# Patient Record
Sex: Female | Born: 1980 | Race: Black or African American | Hispanic: No | Smoking: Current every day smoker
Health system: Southern US, Community
[De-identification: ages and names within clinical notes are randomized; demographics above are authoritative.]

## PROBLEM LIST (undated history)

## (undated) ENCOUNTER — Ambulatory Visit: Source: Home / Self Care

## (undated) ENCOUNTER — Inpatient Hospital Stay (HOSPITAL_COMMUNITY): Payer: Self-pay

## (undated) DIAGNOSIS — J40 Bronchitis, not specified as acute or chronic: Secondary | ICD-10-CM

## (undated) DIAGNOSIS — J189 Pneumonia, unspecified organism: Secondary | ICD-10-CM

## (undated) DIAGNOSIS — E669 Obesity, unspecified: Secondary | ICD-10-CM

## (undated) DIAGNOSIS — E282 Polycystic ovarian syndrome: Secondary | ICD-10-CM

## (undated) DIAGNOSIS — E119 Type 2 diabetes mellitus without complications: Secondary | ICD-10-CM

## (undated) DIAGNOSIS — I10 Essential (primary) hypertension: Secondary | ICD-10-CM

## (undated) HISTORY — PX: CARPAL TUNNEL RELEASE: SHX101

## (undated) HISTORY — PX: BLADDER REPAIR: SHX76

## (undated) HISTORY — PX: CHOLECYSTECTOMY: SHX55

---
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
## 1999-01-17 ENCOUNTER — Other Ambulatory Visit: Admission: RE | Admit: 1999-01-17 | Discharge: 1999-01-17 | Payer: Self-pay | Admitting: Family Medicine

## 1999-01-17 ENCOUNTER — Encounter: Admission: RE | Admit: 1999-01-17 | Discharge: 1999-01-17 | Payer: Self-pay | Admitting: Sports Medicine

## 1999-01-24 ENCOUNTER — Encounter: Admission: RE | Admit: 1999-01-24 | Discharge: 1999-01-24 | Payer: Self-pay | Admitting: Sports Medicine

## 1999-02-06 ENCOUNTER — Inpatient Hospital Stay (HOSPITAL_COMMUNITY): Admission: AD | Admit: 1999-02-06 | Discharge: 1999-02-06 | Payer: Self-pay | Admitting: Obstetrics

## 1999-02-10 ENCOUNTER — Ambulatory Visit (HOSPITAL_COMMUNITY): Admission: RE | Admit: 1999-02-10 | Discharge: 1999-02-10 | Payer: Self-pay | Admitting: Family Medicine

## 1999-02-10 ENCOUNTER — Encounter: Payer: Self-pay | Admitting: Family Medicine

## 1999-02-16 ENCOUNTER — Encounter: Admission: RE | Admit: 1999-02-16 | Discharge: 1999-02-16 | Payer: Self-pay | Admitting: Family Medicine

## 1999-03-15 ENCOUNTER — Encounter: Payer: Self-pay | Admitting: Family Medicine

## 1999-03-15 ENCOUNTER — Ambulatory Visit (HOSPITAL_COMMUNITY): Admission: RE | Admit: 1999-03-15 | Discharge: 1999-03-15 | Payer: Self-pay

## 1999-03-20 ENCOUNTER — Encounter: Admission: RE | Admit: 1999-03-20 | Discharge: 1999-03-20 | Payer: Self-pay | Admitting: Family Medicine

## 1999-04-26 ENCOUNTER — Encounter: Admission: RE | Admit: 1999-04-26 | Discharge: 1999-04-26 | Payer: Self-pay | Admitting: Family Medicine

## 1999-05-01 ENCOUNTER — Encounter: Admission: RE | Admit: 1999-05-01 | Discharge: 1999-05-01 | Payer: Self-pay | Admitting: Family Medicine

## 1999-05-26 ENCOUNTER — Encounter: Admission: RE | Admit: 1999-05-26 | Discharge: 1999-05-26 | Payer: Self-pay | Admitting: Family Medicine

## 1999-05-29 ENCOUNTER — Encounter: Admission: RE | Admit: 1999-05-29 | Discharge: 1999-05-29 | Payer: Self-pay | Admitting: Family Medicine

## 1999-06-05 ENCOUNTER — Ambulatory Visit (HOSPITAL_COMMUNITY): Admission: RE | Admit: 1999-06-05 | Discharge: 1999-06-05 | Payer: Self-pay | Admitting: Obstetrics

## 1999-06-05 ENCOUNTER — Inpatient Hospital Stay (HOSPITAL_COMMUNITY): Admission: AD | Admit: 1999-06-05 | Discharge: 1999-06-05 | Payer: Self-pay | Admitting: Family Medicine

## 1999-06-08 ENCOUNTER — Encounter (HOSPITAL_COMMUNITY): Admission: RE | Admit: 1999-06-08 | Discharge: 1999-08-10 | Payer: Self-pay | Admitting: Obstetrics & Gynecology

## 1999-06-12 ENCOUNTER — Encounter: Payer: Self-pay | Admitting: *Deleted

## 1999-06-29 ENCOUNTER — Inpatient Hospital Stay (HOSPITAL_COMMUNITY): Admission: AD | Admit: 1999-06-29 | Discharge: 1999-06-29 | Payer: Self-pay | Admitting: Obstetrics & Gynecology

## 1999-06-29 ENCOUNTER — Encounter: Admission: RE | Admit: 1999-06-29 | Discharge: 1999-06-29 | Payer: Self-pay | Admitting: Family Medicine

## 1999-06-30 ENCOUNTER — Observation Stay (HOSPITAL_COMMUNITY): Admission: AD | Admit: 1999-06-30 | Discharge: 1999-07-01 | Payer: Self-pay | Admitting: Obstetrics & Gynecology

## 1999-07-07 ENCOUNTER — Encounter: Admission: RE | Admit: 1999-07-07 | Discharge: 1999-07-07 | Payer: Self-pay | Admitting: Family Medicine

## 1999-07-21 ENCOUNTER — Encounter: Admission: RE | Admit: 1999-07-21 | Discharge: 1999-07-21 | Payer: Self-pay | Admitting: Family Medicine

## 1999-07-26 ENCOUNTER — Inpatient Hospital Stay (HOSPITAL_COMMUNITY): Admission: AD | Admit: 1999-07-26 | Discharge: 1999-07-26 | Payer: Self-pay | Admitting: *Deleted

## 1999-07-28 ENCOUNTER — Encounter: Admission: RE | Admit: 1999-07-28 | Discharge: 1999-07-28 | Payer: Self-pay | Admitting: Family Medicine

## 1999-08-09 ENCOUNTER — Inpatient Hospital Stay (HOSPITAL_COMMUNITY): Admission: AD | Admit: 1999-08-09 | Discharge: 1999-08-10 | Payer: Self-pay | Admitting: Obstetrics

## 1999-11-07 ENCOUNTER — Encounter: Admission: RE | Admit: 1999-11-07 | Discharge: 1999-12-19 | Payer: Self-pay | Admitting: Family Medicine

## 2000-01-24 ENCOUNTER — Emergency Department (HOSPITAL_COMMUNITY): Admission: EM | Admit: 2000-01-24 | Discharge: 2000-01-24 | Payer: Self-pay | Admitting: Emergency Medicine

## 2000-05-19 ENCOUNTER — Encounter: Payer: Self-pay | Admitting: Emergency Medicine

## 2000-05-19 ENCOUNTER — Emergency Department (HOSPITAL_COMMUNITY): Admission: EM | Admit: 2000-05-19 | Discharge: 2000-05-19 | Payer: Self-pay | Admitting: Emergency Medicine

## 2001-01-01 ENCOUNTER — Encounter: Admission: RE | Admit: 2001-01-01 | Discharge: 2001-01-01 | Payer: Self-pay | Admitting: Family Medicine

## 2001-03-12 ENCOUNTER — Emergency Department (HOSPITAL_COMMUNITY): Admission: EM | Admit: 2001-03-12 | Discharge: 2001-03-12 | Payer: Self-pay

## 2001-07-23 ENCOUNTER — Other Ambulatory Visit: Admission: RE | Admit: 2001-07-23 | Discharge: 2001-07-23 | Payer: Self-pay | Admitting: Family Medicine

## 2001-07-23 ENCOUNTER — Encounter: Admission: RE | Admit: 2001-07-23 | Discharge: 2001-07-23 | Payer: Self-pay | Admitting: Family Medicine

## 2002-04-08 ENCOUNTER — Encounter: Admission: RE | Admit: 2002-04-08 | Discharge: 2002-04-08 | Payer: Self-pay | Admitting: Family Medicine

## 2002-05-02 ENCOUNTER — Emergency Department (HOSPITAL_COMMUNITY): Admission: EM | Admit: 2002-05-02 | Discharge: 2002-05-02 | Payer: Self-pay | Admitting: Emergency Medicine

## 2002-05-06 ENCOUNTER — Encounter: Admission: RE | Admit: 2002-05-06 | Discharge: 2002-05-06 | Payer: Self-pay | Admitting: Family Medicine

## 2002-11-25 ENCOUNTER — Emergency Department (HOSPITAL_COMMUNITY): Admission: EM | Admit: 2002-11-25 | Discharge: 2002-11-25 | Payer: Self-pay | Admitting: Emergency Medicine

## 2002-11-25 ENCOUNTER — Encounter: Payer: Self-pay | Admitting: Emergency Medicine

## 2003-01-19 ENCOUNTER — Ambulatory Visit (HOSPITAL_BASED_OUTPATIENT_CLINIC_OR_DEPARTMENT_OTHER): Admission: RE | Admit: 2003-01-19 | Discharge: 2003-01-19 | Payer: Self-pay | Admitting: Dentistry

## 2003-04-29 ENCOUNTER — Emergency Department (HOSPITAL_COMMUNITY): Admission: EM | Admit: 2003-04-29 | Discharge: 2003-04-29 | Payer: Self-pay | Admitting: Emergency Medicine

## 2003-07-28 ENCOUNTER — Encounter: Admission: RE | Admit: 2003-07-28 | Discharge: 2003-07-28 | Payer: Self-pay | Admitting: Family Medicine

## 2003-11-03 ENCOUNTER — Encounter: Admission: RE | Admit: 2003-11-03 | Discharge: 2003-11-03 | Payer: Self-pay | Admitting: Sports Medicine

## 2003-11-25 ENCOUNTER — Emergency Department (HOSPITAL_COMMUNITY): Admission: EM | Admit: 2003-11-25 | Discharge: 2003-11-25 | Payer: Self-pay | Admitting: Emergency Medicine

## 2004-03-18 ENCOUNTER — Emergency Department (HOSPITAL_COMMUNITY): Admission: EM | Admit: 2004-03-18 | Discharge: 2004-03-18 | Payer: Self-pay | Admitting: Emergency Medicine

## 2004-03-22 ENCOUNTER — Emergency Department (HOSPITAL_COMMUNITY): Admission: EM | Admit: 2004-03-22 | Discharge: 2004-03-22 | Payer: Self-pay | Admitting: Emergency Medicine

## 2004-07-10 ENCOUNTER — Emergency Department (HOSPITAL_COMMUNITY): Admission: EM | Admit: 2004-07-10 | Discharge: 2004-07-10 | Payer: Self-pay | Admitting: Family Medicine

## 2004-07-28 ENCOUNTER — Emergency Department (HOSPITAL_COMMUNITY): Admission: EM | Admit: 2004-07-28 | Discharge: 2004-07-28 | Payer: Self-pay | Admitting: Emergency Medicine

## 2004-08-12 ENCOUNTER — Emergency Department (HOSPITAL_COMMUNITY): Admission: EM | Admit: 2004-08-12 | Discharge: 2004-08-12 | Payer: Self-pay | Admitting: Family Medicine

## 2004-09-09 ENCOUNTER — Emergency Department (HOSPITAL_COMMUNITY): Admission: EM | Admit: 2004-09-09 | Discharge: 2004-09-10 | Payer: Self-pay | Admitting: Emergency Medicine

## 2004-09-27 ENCOUNTER — Emergency Department (HOSPITAL_COMMUNITY): Admission: EM | Admit: 2004-09-27 | Discharge: 2004-09-27 | Payer: Self-pay | Admitting: Emergency Medicine

## 2004-10-31 ENCOUNTER — Emergency Department (HOSPITAL_COMMUNITY): Admission: EM | Admit: 2004-10-31 | Discharge: 2004-10-31 | Payer: Self-pay | Admitting: Emergency Medicine

## 2004-12-13 ENCOUNTER — Emergency Department (HOSPITAL_COMMUNITY): Admission: EM | Admit: 2004-12-13 | Discharge: 2004-12-13 | Payer: Self-pay | Admitting: Family Medicine

## 2005-02-10 ENCOUNTER — Emergency Department (HOSPITAL_COMMUNITY): Admission: EM | Admit: 2005-02-10 | Discharge: 2005-02-10 | Payer: Self-pay | Admitting: Emergency Medicine

## 2005-02-26 ENCOUNTER — Emergency Department (HOSPITAL_COMMUNITY): Admission: EM | Admit: 2005-02-26 | Discharge: 2005-02-26 | Payer: Self-pay | Admitting: Family Medicine

## 2005-03-05 ENCOUNTER — Emergency Department (HOSPITAL_COMMUNITY): Admission: EM | Admit: 2005-03-05 | Discharge: 2005-03-05 | Payer: Self-pay | Admitting: Family Medicine

## 2005-03-19 ENCOUNTER — Emergency Department (HOSPITAL_COMMUNITY): Admission: EM | Admit: 2005-03-19 | Discharge: 2005-03-19 | Payer: Self-pay | Admitting: Family Medicine

## 2005-03-26 ENCOUNTER — Emergency Department (HOSPITAL_COMMUNITY): Admission: EM | Admit: 2005-03-26 | Discharge: 2005-03-26 | Payer: Self-pay | Admitting: Emergency Medicine

## 2005-04-26 ENCOUNTER — Emergency Department (HOSPITAL_COMMUNITY): Admission: EM | Admit: 2005-04-26 | Discharge: 2005-04-26 | Payer: Self-pay | Admitting: Emergency Medicine

## 2005-05-05 ENCOUNTER — Emergency Department (HOSPITAL_COMMUNITY): Admission: EM | Admit: 2005-05-05 | Discharge: 2005-05-05 | Payer: Self-pay | Admitting: Family Medicine

## 2005-05-28 ENCOUNTER — Emergency Department (HOSPITAL_COMMUNITY): Admission: EM | Admit: 2005-05-28 | Discharge: 2005-05-29 | Payer: Self-pay | Admitting: Emergency Medicine

## 2005-06-17 ENCOUNTER — Emergency Department (HOSPITAL_COMMUNITY): Admission: EM | Admit: 2005-06-17 | Discharge: 2005-06-17 | Payer: Self-pay | Admitting: Emergency Medicine

## 2005-06-23 ENCOUNTER — Emergency Department (HOSPITAL_COMMUNITY): Admission: EM | Admit: 2005-06-23 | Discharge: 2005-06-23 | Payer: Self-pay | Admitting: Emergency Medicine

## 2005-06-25 ENCOUNTER — Emergency Department (HOSPITAL_COMMUNITY): Admission: EM | Admit: 2005-06-25 | Discharge: 2005-06-25 | Payer: Self-pay | Admitting: Emergency Medicine

## 2005-06-26 ENCOUNTER — Ambulatory Visit: Payer: Self-pay | Admitting: Sports Medicine

## 2005-09-08 ENCOUNTER — Emergency Department (HOSPITAL_COMMUNITY): Admission: EM | Admit: 2005-09-08 | Discharge: 2005-09-08 | Payer: Self-pay | Admitting: Family Medicine

## 2005-10-09 ENCOUNTER — Emergency Department (HOSPITAL_COMMUNITY): Admission: EM | Admit: 2005-10-09 | Discharge: 2005-10-10 | Payer: Self-pay | Admitting: Emergency Medicine

## 2005-10-23 ENCOUNTER — Emergency Department (HOSPITAL_COMMUNITY): Admission: EM | Admit: 2005-10-23 | Discharge: 2005-10-23 | Payer: Self-pay | Admitting: *Deleted

## 2005-11-23 ENCOUNTER — Emergency Department (HOSPITAL_COMMUNITY): Admission: EM | Admit: 2005-11-23 | Discharge: 2005-11-23 | Payer: Self-pay | Admitting: Emergency Medicine

## 2005-12-22 ENCOUNTER — Emergency Department (HOSPITAL_COMMUNITY): Admission: EM | Admit: 2005-12-22 | Discharge: 2005-12-22 | Payer: Self-pay | Admitting: Emergency Medicine

## 2006-01-15 ENCOUNTER — Emergency Department (HOSPITAL_COMMUNITY): Admission: EM | Admit: 2006-01-15 | Discharge: 2006-01-15 | Payer: Self-pay | Admitting: Emergency Medicine

## 2006-01-25 ENCOUNTER — Emergency Department (HOSPITAL_COMMUNITY): Admission: EM | Admit: 2006-01-25 | Discharge: 2006-01-25 | Payer: Self-pay | Admitting: Emergency Medicine

## 2006-03-05 ENCOUNTER — Emergency Department (HOSPITAL_COMMUNITY): Admission: EM | Admit: 2006-03-05 | Discharge: 2006-03-05 | Payer: Self-pay | Admitting: Emergency Medicine

## 2006-03-12 ENCOUNTER — Emergency Department (HOSPITAL_COMMUNITY): Admission: EM | Admit: 2006-03-12 | Discharge: 2006-03-12 | Payer: Self-pay | Admitting: Emergency Medicine

## 2006-03-14 ENCOUNTER — Inpatient Hospital Stay (HOSPITAL_COMMUNITY): Admission: EM | Admit: 2006-03-14 | Discharge: 2006-03-16 | Payer: Self-pay | Admitting: Emergency Medicine

## 2006-06-03 ENCOUNTER — Emergency Department (HOSPITAL_COMMUNITY): Admission: EM | Admit: 2006-06-03 | Discharge: 2006-06-03 | Payer: Self-pay | Admitting: Emergency Medicine

## 2006-08-27 ENCOUNTER — Emergency Department (HOSPITAL_COMMUNITY): Admission: EM | Admit: 2006-08-27 | Discharge: 2006-08-27 | Payer: Self-pay | Admitting: Emergency Medicine

## 2006-08-30 ENCOUNTER — Emergency Department (HOSPITAL_COMMUNITY): Admission: EM | Admit: 2006-08-30 | Discharge: 2006-08-30 | Payer: Self-pay | Admitting: Emergency Medicine

## 2006-10-15 ENCOUNTER — Emergency Department (HOSPITAL_COMMUNITY): Admission: EM | Admit: 2006-10-15 | Discharge: 2006-10-15 | Payer: Self-pay | Admitting: Emergency Medicine

## 2006-10-18 ENCOUNTER — Emergency Department (HOSPITAL_COMMUNITY): Admission: EM | Admit: 2006-10-18 | Discharge: 2006-10-18 | Payer: Self-pay | Admitting: Emergency Medicine

## 2007-01-07 ENCOUNTER — Emergency Department (HOSPITAL_COMMUNITY): Admission: EM | Admit: 2007-01-07 | Discharge: 2007-01-07 | Payer: Self-pay | Admitting: Emergency Medicine

## 2007-06-28 ENCOUNTER — Emergency Department (HOSPITAL_COMMUNITY): Admission: EM | Admit: 2007-06-28 | Discharge: 2007-06-28 | Payer: Self-pay | Admitting: Emergency Medicine

## 2007-06-29 ENCOUNTER — Emergency Department (HOSPITAL_COMMUNITY): Admission: EM | Admit: 2007-06-29 | Discharge: 2007-06-30 | Payer: Self-pay | Admitting: Emergency Medicine

## 2007-07-22 ENCOUNTER — Emergency Department (HOSPITAL_COMMUNITY): Admission: EM | Admit: 2007-07-22 | Discharge: 2007-07-22 | Payer: Self-pay | Admitting: Family Medicine

## 2007-08-19 ENCOUNTER — Emergency Department (HOSPITAL_COMMUNITY): Admission: EM | Admit: 2007-08-19 | Discharge: 2007-08-19 | Payer: Self-pay | Admitting: Emergency Medicine

## 2007-09-03 ENCOUNTER — Emergency Department: Payer: Self-pay | Admitting: Emergency Medicine

## 2007-10-04 ENCOUNTER — Emergency Department (HOSPITAL_COMMUNITY): Admission: EM | Admit: 2007-10-04 | Discharge: 2007-10-05 | Payer: Self-pay | Admitting: Emergency Medicine

## 2007-10-18 ENCOUNTER — Emergency Department (HOSPITAL_COMMUNITY): Admission: EM | Admit: 2007-10-18 | Discharge: 2007-10-18 | Payer: Self-pay | Admitting: Emergency Medicine

## 2007-10-28 ENCOUNTER — Emergency Department (HOSPITAL_COMMUNITY): Admission: EM | Admit: 2007-10-28 | Discharge: 2007-10-28 | Payer: Self-pay | Admitting: Emergency Medicine

## 2007-10-30 ENCOUNTER — Emergency Department (HOSPITAL_COMMUNITY): Admission: EM | Admit: 2007-10-30 | Discharge: 2007-10-30 | Payer: Self-pay | Admitting: Nurse Practitioner

## 2008-02-11 ENCOUNTER — Emergency Department (HOSPITAL_BASED_OUTPATIENT_CLINIC_OR_DEPARTMENT_OTHER): Admission: EM | Admit: 2008-02-11 | Discharge: 2008-02-11 | Payer: Self-pay | Admitting: Emergency Medicine

## 2008-05-08 ENCOUNTER — Emergency Department (HOSPITAL_BASED_OUTPATIENT_CLINIC_OR_DEPARTMENT_OTHER): Admission: EM | Admit: 2008-05-08 | Discharge: 2008-05-09 | Payer: Self-pay | Admitting: Emergency Medicine

## 2008-05-08 ENCOUNTER — Ambulatory Visit: Payer: Self-pay | Admitting: Diagnostic Radiology

## 2008-05-22 ENCOUNTER — Emergency Department (HOSPITAL_BASED_OUTPATIENT_CLINIC_OR_DEPARTMENT_OTHER): Admission: EM | Admit: 2008-05-22 | Discharge: 2008-05-22 | Payer: Self-pay | Admitting: Emergency Medicine

## 2008-05-23 ENCOUNTER — Emergency Department (HOSPITAL_COMMUNITY): Admission: EM | Admit: 2008-05-23 | Discharge: 2008-05-23 | Payer: Self-pay | Admitting: Emergency Medicine

## 2008-08-28 ENCOUNTER — Emergency Department (HOSPITAL_COMMUNITY): Admission: EM | Admit: 2008-08-28 | Discharge: 2008-08-28 | Payer: Self-pay | Admitting: Emergency Medicine

## 2008-08-30 ENCOUNTER — Emergency Department (HOSPITAL_BASED_OUTPATIENT_CLINIC_OR_DEPARTMENT_OTHER): Admission: EM | Admit: 2008-08-30 | Discharge: 2008-08-30 | Payer: Self-pay | Admitting: Emergency Medicine

## 2008-11-26 ENCOUNTER — Ambulatory Visit (HOSPITAL_COMMUNITY): Admission: RE | Admit: 2008-11-26 | Discharge: 2008-11-26 | Payer: Self-pay | Admitting: Obstetrics

## 2008-11-30 ENCOUNTER — Ambulatory Visit: Payer: Self-pay | Admitting: Radiology

## 2008-11-30 ENCOUNTER — Emergency Department (HOSPITAL_BASED_OUTPATIENT_CLINIC_OR_DEPARTMENT_OTHER): Admission: EM | Admit: 2008-11-30 | Discharge: 2008-11-30 | Payer: Self-pay | Admitting: Emergency Medicine

## 2008-12-04 ENCOUNTER — Emergency Department (HOSPITAL_BASED_OUTPATIENT_CLINIC_OR_DEPARTMENT_OTHER): Admission: EM | Admit: 2008-12-04 | Discharge: 2008-12-04 | Payer: Self-pay | Admitting: Emergency Medicine

## 2009-01-28 ENCOUNTER — Emergency Department (HOSPITAL_COMMUNITY): Admission: EM | Admit: 2009-01-28 | Discharge: 2009-01-28 | Payer: Self-pay | Admitting: Emergency Medicine

## 2009-02-15 ENCOUNTER — Emergency Department (HOSPITAL_COMMUNITY): Admission: EM | Admit: 2009-02-15 | Discharge: 2009-02-16 | Payer: Self-pay | Admitting: Emergency Medicine

## 2009-03-23 ENCOUNTER — Ambulatory Visit: Payer: Self-pay | Admitting: Diagnostic Radiology

## 2009-03-23 ENCOUNTER — Emergency Department (HOSPITAL_BASED_OUTPATIENT_CLINIC_OR_DEPARTMENT_OTHER): Admission: EM | Admit: 2009-03-23 | Discharge: 2009-03-23 | Payer: Self-pay | Admitting: Emergency Medicine

## 2009-05-30 ENCOUNTER — Encounter: Admission: RE | Admit: 2009-05-30 | Discharge: 2009-08-28 | Payer: Self-pay | Admitting: Internal Medicine

## 2009-06-18 ENCOUNTER — Emergency Department (HOSPITAL_BASED_OUTPATIENT_CLINIC_OR_DEPARTMENT_OTHER): Admission: EM | Admit: 2009-06-18 | Discharge: 2009-06-18 | Payer: Self-pay | Admitting: Emergency Medicine

## 2009-06-18 ENCOUNTER — Ambulatory Visit: Payer: Self-pay | Admitting: Diagnostic Radiology

## 2009-07-31 ENCOUNTER — Emergency Department (HOSPITAL_BASED_OUTPATIENT_CLINIC_OR_DEPARTMENT_OTHER): Admission: EM | Admit: 2009-07-31 | Discharge: 2009-07-31 | Payer: Self-pay | Admitting: Emergency Medicine

## 2009-08-30 ENCOUNTER — Emergency Department (HOSPITAL_BASED_OUTPATIENT_CLINIC_OR_DEPARTMENT_OTHER): Admission: EM | Admit: 2009-08-30 | Discharge: 2009-08-30 | Payer: Self-pay | Admitting: Emergency Medicine

## 2009-09-23 ENCOUNTER — Emergency Department (HOSPITAL_BASED_OUTPATIENT_CLINIC_OR_DEPARTMENT_OTHER): Admission: EM | Admit: 2009-09-23 | Discharge: 2009-09-23 | Payer: Self-pay | Admitting: Emergency Medicine

## 2009-10-12 ENCOUNTER — Emergency Department (HOSPITAL_BASED_OUTPATIENT_CLINIC_OR_DEPARTMENT_OTHER): Admission: EM | Admit: 2009-10-12 | Discharge: 2009-10-13 | Payer: Self-pay | Admitting: Emergency Medicine

## 2010-03-04 ENCOUNTER — Emergency Department (HOSPITAL_BASED_OUTPATIENT_CLINIC_OR_DEPARTMENT_OTHER): Admission: EM | Admit: 2010-03-04 | Discharge: 2010-03-04 | Payer: Self-pay | Admitting: Emergency Medicine

## 2010-03-04 ENCOUNTER — Ambulatory Visit: Payer: Self-pay | Admitting: Diagnostic Radiology

## 2010-06-10 ENCOUNTER — Encounter: Payer: Self-pay | Admitting: Emergency Medicine

## 2010-07-29 ENCOUNTER — Emergency Department (HOSPITAL_BASED_OUTPATIENT_CLINIC_OR_DEPARTMENT_OTHER)
Admission: EM | Admit: 2010-07-29 | Discharge: 2010-07-29 | Disposition: A | Discharge status: Home / Self Care | Payer: Medicaid Other | Attending: Emergency Medicine | Admitting: Emergency Medicine

## 2010-07-29 ENCOUNTER — Emergency Department (INDEPENDENT_AMBULATORY_CARE_PROVIDER_SITE_OTHER): Payer: Medicaid Other

## 2010-07-29 DIAGNOSIS — E119 Type 2 diabetes mellitus without complications: Secondary | ICD-10-CM | POA: Insufficient documentation

## 2010-07-29 DIAGNOSIS — R05 Cough: Secondary | ICD-10-CM

## 2010-07-29 DIAGNOSIS — E669 Obesity, unspecified: Secondary | ICD-10-CM | POA: Insufficient documentation

## 2010-07-29 DIAGNOSIS — R059 Cough, unspecified: Secondary | ICD-10-CM | POA: Insufficient documentation

## 2010-07-29 DIAGNOSIS — F17200 Nicotine dependence, unspecified, uncomplicated: Secondary | ICD-10-CM | POA: Insufficient documentation

## 2010-07-29 DIAGNOSIS — J40 Bronchitis, not specified as acute or chronic: Secondary | ICD-10-CM | POA: Insufficient documentation

## 2010-07-29 DIAGNOSIS — I10 Essential (primary) hypertension: Secondary | ICD-10-CM | POA: Insufficient documentation

## 2010-07-29 DIAGNOSIS — R0602 Shortness of breath: Secondary | ICD-10-CM

## 2010-07-29 DIAGNOSIS — J45909 Unspecified asthma, uncomplicated: Secondary | ICD-10-CM | POA: Insufficient documentation

## 2010-07-30 ENCOUNTER — Inpatient Hospital Stay (INDEPENDENT_AMBULATORY_CARE_PROVIDER_SITE_OTHER)
Admission: RE | Admit: 2010-07-30 | Discharge: 2010-07-30 | Disposition: A | Discharge status: Home / Self Care | Payer: Medicaid Other | Source: Ambulatory Visit | Attending: Emergency Medicine | Admitting: Emergency Medicine

## 2010-07-30 DIAGNOSIS — J45909 Unspecified asthma, uncomplicated: Secondary | ICD-10-CM

## 2010-08-02 LAB — DIFFERENTIAL
Basophils Absolute: 0.1 10*3/uL (ref 0.0–0.1)
Basophils Relative: 1 % (ref 0–1)
Monocytes Relative: 6 % (ref 3–12)
Neutro Abs: 1.8 10*3/uL (ref 1.7–7.7)
Neutrophils Relative %: 33 % — ABNORMAL LOW (ref 43–77)

## 2010-08-02 LAB — BASIC METABOLIC PANEL
Calcium: 9.6 mg/dL (ref 8.4–10.5)
Creatinine, Ser: 0.8 mg/dL (ref 0.4–1.2)
GFR calc non Af Amer: >60 mL/min (ref >60)
Glucose, Bld: 118 mg/dL — ABNORMAL HIGH (ref 70–99)
Sodium: 140 mEq/L (ref 135–145)

## 2010-08-02 LAB — CBC
Hemoglobin: 14.8 g/dL (ref 12.0–15.0)
MCHC: 33.9 g/dL (ref 30.0–36.0)
Platelets: 300 10*3/uL (ref 150–400)
RDW: 12.1 % (ref 11.5–15.5)

## 2010-08-02 LAB — D-DIMER, QUANTITATIVE: D-Dimer, Quant: 0.39 ug/mL-FEU (ref 0.00–0.48)

## 2010-08-06 LAB — URINALYSIS, ROUTINE W REFLEX MICROSCOPIC
Glucose, UA: NEGATIVE mg/dL
Hgb urine dipstick: NEGATIVE
Ketones, ur: NEGATIVE mg/dL
Protein, ur: NEGATIVE mg/dL
pH: 5.5 (ref 5.0–8.0)

## 2010-08-06 LAB — GLUCOSE, CAPILLARY: Glucose-Capillary: 124 mg/dL — ABNORMAL HIGH (ref 70–99)

## 2010-08-07 LAB — URINALYSIS, ROUTINE W REFLEX MICROSCOPIC
Bilirubin Urine: NEGATIVE
Hgb urine dipstick: NEGATIVE
Nitrite: NEGATIVE
Specific Gravity, Urine: 1.031 — ABNORMAL HIGH (ref 1.005–1.030)
pH: 5.5 (ref 5.0–8.0)

## 2010-08-07 LAB — GLUCOSE, CAPILLARY: Glucose-Capillary: 100 mg/dL — ABNORMAL HIGH (ref 70–99)

## 2010-08-07 LAB — WET PREP, GENITAL
Clue Cells Wet Prep HPF POC: NONE SEEN
Trich, Wet Prep: NONE SEEN

## 2010-08-07 LAB — GC/CHLAMYDIA PROBE AMP, GENITAL: GC Probe Amp, Genital: NEGATIVE

## 2010-08-07 LAB — URINE MICROSCOPIC-ADD ON

## 2010-08-08 LAB — RAPID STREP SCREEN (MED CTR MEBANE ONLY): Streptococcus, Group A Screen (Direct): NEGATIVE

## 2010-08-13 LAB — URINALYSIS, ROUTINE W REFLEX MICROSCOPIC
Bilirubin Urine: NEGATIVE
Nitrite: NEGATIVE
Protein, ur: NEGATIVE mg/dL
Specific Gravity, Urine: 1.01 (ref 1.005–1.030)
Urobilinogen, UA: 0.2 mg/dL (ref 0.0–1.0)

## 2010-08-13 LAB — PREGNANCY, URINE: Preg Test, Ur: NEGATIVE

## 2010-08-25 LAB — URINALYSIS, ROUTINE W REFLEX MICROSCOPIC
Bilirubin Urine: NEGATIVE
Glucose, UA: NEGATIVE mg/dL
Hgb urine dipstick: NEGATIVE
Nitrite: NEGATIVE
Specific Gravity, Urine: 1.003 — ABNORMAL LOW (ref 1.005–1.030)
Urobilinogen, UA: 0.2 mg/dL (ref 0.0–1.0)
pH: 6.5 (ref 5.0–8.0)

## 2010-08-25 LAB — POCT I-STAT, CHEM 8
Calcium, Ion: 1.11 mmol/L — ABNORMAL LOW (ref 1.12–1.32)
Glucose, Bld: 115 mg/dL — ABNORMAL HIGH (ref 70–99)
HCT: 40 % (ref 36.0–46.0)
Hemoglobin: 13.6 g/dL (ref 12.0–15.0)

## 2010-08-25 LAB — PREGNANCY, URINE: Preg Test, Ur: NEGATIVE

## 2010-10-06 NOTE — Discharge Summary (Signed)
[facility]  Patient:    Michelle Houston, Michelle Houston                    MRN: [id number] Adm. Date:  [id number] Disc. Date: [id number] Attending:  Antionette Char Dictator:   Bernette Redbird, M.D.                           Discharge Summary  DISCHARGE DIAGNOSES:          1. 34-4/[redacted] week gestation.                               2. Preterm labor.                               3. Yeast vaginitis.  DISCHARGE MEDICATIONS:        Prenatal vitamins.  DISCHARGE INSTRUCTIONS:       Bed rest with bathroom privileges.  Labor precautions.  Follow up with Birdena Jubilee, M.D. (primary care physician) in one  week.  HISTORY OF PRESENT ILLNESS:   The patient is a 30 year old, gravida 2, para 1-0-0-1, who presented to [facility] at 33-3/[redacted] weeks gestation compliaining of a five-hour history of contractions.  She was given Terbutaline  while in the triage area and had some decrease in her contractions.  However, it returned two hours later.  Her pregnancy had been complicated by a history of HSV, GC, and Chlamydia early in pregnancy (January 17, 1999).  On initial presentation, she was afebrile, 98.3, with a blood pressure of 142/85, pulse 122, and fetal heart rates 130 with good accellerations.  She was contracting every four to six minutes. Her lungs were clear.   Heart regular rate and rhythm.  Abdomen soft, nontender, gravid.  2/4 deep tendon reflexes.  No edema and no clonus.  She was initially cm dilated, 10% effaced, and -2 station which over the next two hours progressed to 3 cm dilated, 30% effaced, and -2 station.  An ultrasound obtained on June 26, 1999, prior to her presentation showed her to have a normal AFI of 11.4 with an  estimated fetal weight at 43rd percentile and a BPP of 8/8.  Her wet prep showed white blood cells and a few yeast that was otherwise negative.  HOSPITAL COURSE:              The patient was thus admitted  with a diagnosis of  preterm labor and begun on magnesium sulfate which after a 4 gram bolus was at  grams per hour.  She had only an occasional contraction after that and on hospital day #2, her magnesium sulfate was discontinued and she was watched over the next three hours.  She had only one to two contractions per hour.  She was thus discharged on July 01, 1999, on the medications and instructions as noted above. DD:  07/01/99 TD:  07/03/99 Job: [id number]

## 2010-10-06 NOTE — Op Note (Signed)
NAME:  Michelle Houston, Michelle Houston                       ACCOUNT NO.:  [id number]   MEDICAL RECORD NO.:  [id number]                   PATIENT TYPE:  AMB   LOCATION:  DSC                                  FACILITY:  [facility]   PHYSICIAN:  Griffith Citron Mohorn, D.D.S.          DATE OF BIRTH:  05-31-1980   DATE OF PROCEDURE:  01/19/2003  DATE OF DISCHARGE:                                 OPERATIVE REPORT   PREOPERATIVE DIAGNOSIS:  Decayed teeth, impacted tooth #1.   POSTOPERATIVE DIAGNOSIS:  Decayed teeth, impacted tooth #1.   PROCEDURE:  Extraction of teeth #'s 1, 2, 16 and 30.   SURGEON:  Griffith Citron Mohorn, D.D.S.   ANESTHESIA:  General.   INDICATIONS FOR PROCEDURE:  The patient was evaluated in my office and found  to have multiple decayed teeth, as well as impacted tooth #1.  She  previously had teeth in the left mandible extracted under IV sedation in the  office; however, she was extremely combative and developed asthmatic  intraoperatively.  Due to the fact that the patient's asthma was not very  well controlled, it was determined that her airway required better  protection.  Therefore she was taken to the [facility] to have the remaining teeth extracted.   DESCRIPTION OF PROCEDURE:  The patient was brought to the operating room and  placed in the supine position.  Once general anesthesia was induced by  orotracheal intubation, the patient was prepped and draped for a  maxillofacial procedure at this time.  Initially local anesthesia was  injected, consisting of 2% lidocaine with 1:100,000 epinephrine, totalling  5.5 cc.   An incision was made around teeth #'s 2, 16, 30 and extended the incision  posteriorly adjacent to tooth #2 and through the gingival tissue overlying  tooth #1.  Soft tissue flap was reflected.  All teeth were loosened with  dental elevators.  All teeth were then extracted without complications.  Tooth #30 required use of a rotary  osteotome, utilizing a Fisher bur with  copious amounts of saline irrigation to section the tooth.  Tooth #30 was  then extracted without complications.   All surgical sites were irrigated with normal saline and suctioned dry.  Chromic sutures were placed over each extraction site, consisting of 2-0  chromic suture.  The oropharyngeal cavity was irrigated with normal saline  and suctioned dry.  The oropharyngeal throat pack (which had been placed  preoperatively) was then removed.   This concluded the procedure.  The patient was allowed to awaken and was  extubated without complications.  She was then transferred to the PACU in  stable and satisfactory condition.  Griffith Citron Mohorn, D.D.S.    Gardenia Phlegm  D:  01/19/2003  T:  01/19/2003  Job:  [id number]

## 2010-10-06 NOTE — H&P (Signed)
Michelle Houston, Michelle Houston             ACCOUNT NO.:  [id number]   MEDICAL RECORD NO.:  [id number]          PATIENT TYPE:  INP   LOCATION:  5735                         FACILITY:  [facility]   PHYSICIAN:  Corinna L. Lendell Caprice, MDDATE OF BIRTH:  [birth year]   DATE OF ADMISSION:  03/14/2006  DATE OF DISCHARGE:                                HISTORY & PHYSICAL   CHIEF COMPLAINT:  Shortness of breath and cough.   HISTORY OF PRESENT ILLNESS:  Michelle Houston is an unassigned 30 year old black  female with a reported history of bronchitis and childhood asthma who  presents to the emergency room with worsening shortness of breath and cough.  She also has been having pleuritic chest pain.  She was seen in the  emergency room on the 23rd and given a prescription for Z-Pak.  She came to  [facility] emergency room last night and was treated and released.  She  subsequently went to [facility], who gave her a nebulizer  treatment and sent her to the emergency room again today.  She has been  having subjective fevers and chills.  She feels weak.  She has had a cough  productive of blood-tinged sputum.  She has been smoking one to two packs a  day since about age 75 or so.  She has no primary care physician.  She takes  albuterol nebulizer as needed at home but usually only requires it a few  times a year when she has a respiratory infection.   PAST MEDICAL HISTORY:  As above.   MEDICATIONS:  Azithromycin, cough suppressant, albuterol nebulizer as  needed.   SOCIAL HISTORY:  She works at [employer].  She denies drinking or drugs.  She is here with her mother.  She smokes a pack to two packs a day.   FAMILY HISTORY:  Noncontributory.   REVIEW OF SYSTEMS:  As above; otherwise negative.   PHYSICAL EXAMINATION:  VITAL SIGNS:  Her temperature is 100.1, blood  pressure 119/82, pulse 112, respiratory rate 20, oxygen saturation 91% on  room air.  GENERAL:  The patient is an obese black female who  appears uncomfortable but  is able to speak in full sentences.  HEENT:  Normocephalic, atraumatic.  Pupils equal, round, reactive to light.  Sclerae nonicteric.  She has moist mucous membranes.  Oropharynx is without  erythema or exudate.  NECK:  Supple.  No lymphadenopathy.  LUNGS:  With bilateral mild expiratory wheeze.  No rhonchi or rales.  CARDIOVASCULAR:  Regular rhythm, rapid.  No murmurs, gallops or rubs.  ABDOMEN:  Soft, nontender, nondistended.  GU/RECTAL:  Deferred.  EXTREMITIES:  No clubbing, cyanosis or edema.  SKIN:  No rash.  PSYCHIATRIC:  Normal affect.  NEUROLOGIC:  Alert and oriented.  Cranial nerves and sensorimotor exam are  intact.   LABORATORY:  CBC unremarkable.  Complete metabolic panel significant for an  SGOT of 93, SGPT of 62, albumin 3.0 otherwise unremarkable.   Chest x-ray shows probable bilateral pneumonia, worse as compared to October  23.   ASSESSMENT/PLAN:  1. Community-acquired pneumonia.  The patient has been on azithromycin  for      two days and has presented to the emergency room and/or Urgent Care      four times within three days.  I think it is reasonable to admit her to      the hospital for IV antibiotics, nebulizers.  I have counseled her to      quit smoking, and I will get a smoking cessation consult.  I will also      give prednisone.  I suspect, given her tobacco use, she may have an      element of chronic obstructive pulmonary disease, and I will give      Atrovent in addition to the albuterol.  2. Asthma/questionable chronic obstructive pulmonary disease exacerbation.  3. Obesity.      Corinna L. Lendell Caprice, MD  Electronically Signed     CLS/MEDQ  D:  03/14/2006  T:  03/15/2006  Job:  [id number]

## 2010-10-06 NOTE — Discharge Summary (Signed)
Michelle Houston, Michelle Houston             ACCOUNT NO.:  [id number]   MEDICAL RECORD NO.:  [id number]          PATIENT TYPE:  INP   LOCATION:  5735                         FACILITY:  [facility]   PHYSICIAN:  Corinna L. Lendell Caprice, MDDATE OF BIRTH:  Jun 29, 1980   DATE OF ADMISSION:  03/14/2006  DATE OF DISCHARGE:  03/16/2006                                 DISCHARGE SUMMARY   DISCHARGE DIAGNOSES:  1. Pneumonia.  2. Asthma exacerbation, suspect chronic obstructive pulmonary disease.  3. Steroid-induced hyperglycemia.  4. Obesity  5. Tobacco abuse, counseled against.   DISCHARGE MEDICATIONS:  1,  Avelox 400 mg p.o. q.d. until gone.  1. Prednisone taper.  2. Albuterol every 4 hours as needed for wheezing.   DIET:  Regular.   ACTIVITY:  Increase slowly,   FOLLOWUP:  Follow up with primary care physician of choice in 4 weeks.   CONDITION ON DISCHARGE:  Stable.   CONSULTATIONS:  None.   PROCEDURE:  None.   PERTINENT LABORATORY DATA:  Complete metabolic panel was significant for an  SGOT of 93, SGPT of 62 and an albumin of 3.0.  CBC was unremarkable.  Blood  cultures negative to date.  Special studies in radiology:  Chest x-ray  showed probable worsening bilateral pneumonia.   HISTORY OF PRESENT ILLNESS:  Michelle Houston is a pleasant unassigned black  female with a reported history of asthma and a two pack per day smoking  history since her pre teen years.  She presented to the emergency room after  having been to several emergency rooms and an urgent care center within the  past three days.  She had originally been diagnosed with pneumonia and given  a Z-Pak and sent home.  She presented again several times with shortness of  breath.  She has a nebulizer machine at home.  She was weak, having  subjective fevers.  Upon admission she was noted to have a temperature of  100.1, pulse 112; the rest of her vitals were unremarkable.  She was  somewhat uncomfortable but was able to speak in full  sentences and had mild  expiratory wheeze.  Chest x-ray showed worsening probable bilateral  pneumonia.   HOSPITAL COURSE:  The patient was admitted to the floor and started on IV  Avelox, oral prednisone, albuterol and Atrovent nebulizers.  She spent time  with the smoking cessation counselor and plans to quit with patches.  By  the time of discharge she was feeling much better, was afebrile and had no  wheeze.  Her blood sugars did run about 200 but upon admission her glucose  was normal and she has no history of diabetes.  She has no primary care  physician but is encouraged to follow up in four weeks.      Corinna L. Lendell Caprice, MD  Electronically Signed     CLS/MEDQ  D:  03/16/2006  T:  03/18/2006  Job:  [id number]

## 2011-02-09 LAB — URINALYSIS, ROUTINE W REFLEX MICROSCOPIC
Bilirubin Urine: NEGATIVE
Ketones, ur: NEGATIVE
Nitrite: NEGATIVE
Urobilinogen, UA: 0.2

## 2011-02-14 LAB — POCT URINALYSIS DIP (DEVICE)
Glucose, UA: NEGATIVE
Ketones, ur: NEGATIVE
Operator id: 282151
Protein, ur: >=300 — AB
Specific Gravity, Urine: >=1.03

## 2011-02-15 ENCOUNTER — Emergency Department (HOSPITAL_BASED_OUTPATIENT_CLINIC_OR_DEPARTMENT_OTHER)
Admission: EM | Admit: 2011-02-15 | Discharge: 2011-02-15 | Disposition: A | Discharge status: Home / Self Care | Payer: Medicaid Other | Attending: Emergency Medicine | Admitting: Emergency Medicine

## 2011-02-15 ENCOUNTER — Encounter: Payer: Self-pay | Admitting: *Deleted

## 2011-02-15 DIAGNOSIS — G5620 Lesion of ulnar nerve, unspecified upper limb: Secondary | ICD-10-CM

## 2011-02-15 DIAGNOSIS — G5601 Carpal tunnel syndrome, right upper limb: Secondary | ICD-10-CM

## 2011-02-15 DIAGNOSIS — F17200 Nicotine dependence, unspecified, uncomplicated: Secondary | ICD-10-CM | POA: Insufficient documentation

## 2011-02-15 DIAGNOSIS — G5600 Carpal tunnel syndrome, unspecified upper limb: Secondary | ICD-10-CM | POA: Insufficient documentation

## 2011-02-15 DIAGNOSIS — E119 Type 2 diabetes mellitus without complications: Secondary | ICD-10-CM | POA: Insufficient documentation

## 2011-02-15 DIAGNOSIS — I10 Essential (primary) hypertension: Secondary | ICD-10-CM | POA: Insufficient documentation

## 2011-02-15 DIAGNOSIS — M25539 Pain in unspecified wrist: Secondary | ICD-10-CM | POA: Insufficient documentation

## 2011-02-15 HISTORY — DX: Essential (primary) hypertension: I10

## 2011-02-15 LAB — URINE CULTURE: Colony Count: 50000

## 2011-02-15 LAB — URINALYSIS, ROUTINE W REFLEX MICROSCOPIC
Bilirubin Urine: NEGATIVE
Specific Gravity, Urine: 1.011
pH: 5.5

## 2011-02-15 LAB — URINE MICROSCOPIC-ADD ON

## 2011-02-15 LAB — POCT PREGNANCY, URINE
Operator id: 247071
Preg Test, Ur: NEGATIVE

## 2011-02-15 LAB — POCT URINALYSIS DIP (DEVICE)
Bilirubin Urine: NEGATIVE
Glucose, UA: NEGATIVE
Nitrite: NEGATIVE

## 2011-02-15 MED ORDER — IBUPROFEN 800 MG PO TABS
800.0000 mg | ORAL_TABLET | Freq: Once | ORAL | Status: AC
  Administered 2011-02-15: 800 mg via ORAL
  Filled 2011-02-15: qty 1

## 2011-02-15 MED ORDER — HYDROCODONE-ACETAMINOPHEN 5-500 MG PO TABS: 1.0000 | ORAL_TABLET | Freq: Four times a day (QID) | ORAL | Status: AC | PRN

## 2011-02-15 MED ORDER — NAPROXEN 500 MG PO TABS: 500.0000 mg | ORAL_TABLET | Freq: Two times a day (BID) | ORAL | Status: DC

## 2011-02-15 MED ORDER — HYDROCODONE-ACETAMINOPHEN 5-325 MG PO TABS
2.0000 | ORAL_TABLET | Freq: Once | ORAL | Status: AC
  Administered 2011-02-15: 2 via ORAL
  Filled 2011-02-15: qty 2

## 2011-02-15 NOTE — ED Notes (Signed)
Pt amb to room 5 with quick steady gait in nad.  Pt reports right wrist pain x 1 week, states it feels like her carpal tunnel pain.

## 2011-02-15 NOTE — ED Provider Notes (Signed)
History     CSN: [id number] Arrival date & time: 02/15/2011 10:54 AM  Chief Complaint  Patient presents with  . Wrist Pain    (Consider location/radiation/quality/duration/timing/severity/associated sxs/prior treatment) HPI Comments: History of carpal tunnel syndrome. Pain worse at night. Describes pain in the palm of the hand she describes relatively diffuse throughout the hand radiating up the arm to the elbow. Right arm is unaffected. No other symptoms. No known injury  Patient is a 30 y.o. female presenting with wrist pain. The history is provided by the patient. No language interpreter was used.  Wrist Pain This is a recurrent problem. The current episode started more than 2 days ago (1 week ago). The problem occurs constantly. The problem has been gradually worsening. Pertinent negatives include no chest pain, no abdominal pain, no headaches and no shortness of breath. The symptoms are aggravated by bending (movement). The symptoms are relieved by nothing. Treatments tried: nsaids. The treatment provided mild relief.    Past Medical History  Diagnosis Date  . Hypertension   . Diabetes mellitus     Past Surgical History  Procedure Date  . Carpal tunnel release     History reviewed. No pertinent family history.  History  Substance Use Topics  . Smoking status: Current Everyday Smoker  . Smokeless tobacco: Not on file  . Alcohol Use: Yes    OB History    Grav Para Term Preterm Abortions TAB SAB Ect Mult Living                  Review of Systems  Constitutional: Negative for fever, activity change and appetite change.  HENT: Negative for congestion, sore throat, rhinorrhea, neck pain and neck stiffness.   Respiratory: Negative for cough, chest tightness and shortness of breath.   Cardiovascular: Negative for chest pain and palpitations.  Gastrointestinal: Negative for nausea, vomiting and abdominal pain.  Genitourinary: Negative for dysuria, urgency, frequency and  flank pain.  Musculoskeletal: Positive for myalgias and arthralgias. Negative for back pain.  Skin: Negative for rash and wound.  Neurological: Negative for dizziness, weakness, light-headedness, numbness and headaches.  All other systems reviewed and are negative.    Allergies  Review of patient's allergies indicates no known allergies.  Home Medications   Current Outpatient Rx  Name Route Sig Dispense Refill  . HYDROCHLOROTHIAZIDE 12.5 MG PO TABS Oral Take 12.5 mg by mouth daily.      Marland Kitchen METFORMIN HCL 500 MG (MOD) PO TB24 Oral Take 500 mg by mouth 2 (two) times daily with a meal.      . HYDROCODONE-ACETAMINOPHEN 5-500 MG PO TABS Oral Take 1-2 tablets by mouth every 6 (six) hours as needed for pain. 15 tablet 0  . NAPROXEN 500 MG PO TABS Oral Take 1 tablet (500 mg total) by mouth 2 (two) times daily. 30 tablet 0    BP 149/80  Pulse 79  Temp(Src) 98.2 F (36.8 C) (Oral)  Resp 18  SpO2 100%  Physical Exam  Nursing note and vitals reviewed. Constitutional: She is oriented to person, place, and time. She appears well-developed and well-nourished. No distress.  HENT:  Head: Normocephalic and atraumatic.  Mouth/Throat: Oropharynx is clear and moist.  Eyes: Conjunctivae and EOM are normal. Pupils are equal, round, and reactive to light.  Neck: Normal range of motion. Neck supple.  Cardiovascular: Normal rate, regular rhythm, normal heart sounds and intact distal pulses.  Exam reveals no gallop and no friction rub.   No murmur heard. Pulmonary/Chest: Effort  normal and breath sounds normal. No respiratory distress.  Abdominal: Soft. Bowel sounds are normal. There is no tenderness.  Musculoskeletal: Normal range of motion.       Positive Tinel's, Phalen's, median nerve compression test. Patient also has pain on palpation of the cubital tunnel. All symptoms are present on the right  Neurological: She is alert and oriented to person, place, and time.  Skin: Skin is warm and dry. No  rash noted.    ED Course  Procedures (including critical care time)  Labs Reviewed - No data to display No results found.   1. Carpal tunnel syndrome of right wrist   2. Cubital tunnel syndrome       MDM  No imaging is indicated at this time. Her symptoms are consistent with carpal and cubital tunnel. She'll be placed in a Velcro wrist splint. I encouraged ice and heat to assist with inflammation. She'll be placed on anti-inflammatory medication as well as pain medication. She'll be referred to Dr. Magnus Ivan who performed the surgery in the past.        Dayton Bailiff, MD 02/15/11 1131

## 2011-04-09 ENCOUNTER — Inpatient Hospital Stay (HOSPITAL_COMMUNITY)
Admission: AD | Admit: 2011-04-09 | Discharge: 2011-04-09 | Disposition: A | Discharge status: Home / Self Care | Payer: Medicaid Other | Source: Ambulatory Visit | Attending: Obstetrics & Gynecology | Admitting: Obstetrics & Gynecology

## 2011-04-09 ENCOUNTER — Encounter (HOSPITAL_COMMUNITY): Payer: Self-pay | Admitting: *Deleted

## 2011-04-09 DIAGNOSIS — N926 Irregular menstruation, unspecified: Secondary | ICD-10-CM | POA: Insufficient documentation

## 2011-04-09 DIAGNOSIS — R1032 Left lower quadrant pain: Secondary | ICD-10-CM | POA: Insufficient documentation

## 2011-04-09 DIAGNOSIS — N949 Unspecified condition associated with female genital organs and menstrual cycle: Secondary | ICD-10-CM | POA: Insufficient documentation

## 2011-04-09 HISTORY — DX: Polycystic ovarian syndrome: E28.2

## 2011-04-09 LAB — COMPREHENSIVE METABOLIC PANEL
ALT: 59 U/L — ABNORMAL HIGH (ref 0–35)
Calcium: 9.7 mg/dL (ref 8.4–10.5)
Creatinine, Ser: 0.71 mg/dL (ref 0.50–1.10)
GFR calc Af Amer: >90 mL/min (ref >90)
Glucose, Bld: 120 mg/dL — ABNORMAL HIGH (ref 70–99)
Sodium: 137 mEq/L (ref 135–145)
Total Protein: 7 g/dL (ref 6.0–8.3)

## 2011-04-09 LAB — CBC
HCT: 40.8 % (ref 36.0–46.0)
MCH: 30.3 pg (ref 26.0–34.0)
MCHC: 34.8 g/dL (ref 30.0–36.0)
MCV: 87.2 fL (ref 78.0–100.0)
Platelets: 287 10*3/uL (ref 150–400)
RDW: 12.8 % (ref 11.5–15.5)
WBC: 7.5 10*3/uL (ref 4.0–10.5)

## 2011-04-09 LAB — URINALYSIS, ROUTINE W REFLEX MICROSCOPIC
Bilirubin Urine: NEGATIVE
Glucose, UA: NEGATIVE mg/dL
Hgb urine dipstick: NEGATIVE
Ketones, ur: NEGATIVE mg/dL
Leukocytes, UA: NEGATIVE
Nitrite: NEGATIVE
Protein, ur: NEGATIVE mg/dL
Specific Gravity, Urine: 1.015 (ref 1.005–1.030)
Urobilinogen, UA: 0.2 mg/dL (ref 0.0–1.0)
pH: 6 (ref 5.0–8.0)

## 2011-04-09 LAB — WET PREP, GENITAL
Clue Cells Wet Prep HPF POC: NONE SEEN
Trich, Wet Prep: NONE SEEN
Yeast Wet Prep HPF POC: NONE SEEN

## 2011-04-09 MED ORDER — POTASSIUM CHLORIDE CRYS ER 20 MEQ PO TBCR
20.0000 meq | EXTENDED_RELEASE_TABLET | Freq: Once | ORAL | Status: AC
  Administered 2011-04-09: 20 meq via ORAL
  Filled 2011-04-09: qty 1

## 2011-04-09 MED ORDER — POTASSIUM CHLORIDE CRYS ER 20 MEQ PO TBCR: 20.0000 meq | EXTENDED_RELEASE_TABLET | Freq: Every day | ORAL | Status: DC

## 2011-04-09 NOTE — ED Provider Notes (Signed)
History     Chief Complaint  Patient presents with  . Abdominal Pain  . Vaginal Bleeding   Abdominal Pain This is a chronic problem. The current episode started 1 to 4 weeks ago. The onset quality is gradual. The problem occurs constantly. The problem has been unchanged. The pain is located in the LLQ. The pain is at a severity of 3/10. The pain is mild. The quality of the pain is dull. The abdominal pain does not radiate. Associated symptoms include diarrhea (pt reports mild diarrhea for the last few days. ) and nausea (slightly nauseated. ). Pertinent negatives include no dysuria, fever, frequency, headaches, hematuria, vomiting or weight loss. The pain is aggravated by nothing. The pain is relieved by nothing. She has tried nothing for the symptoms. Her past medical history is significant for abdominal surgery (hx cholecystectomy. ).  Vaginal Bleeding Associated symptoms include abdominal pain, diarrhea (pt reports mild diarrhea for the last few days. ) and nausea (slightly nauseated. ). Pertinent negatives include no dysuria, fever, frequency, headaches, hematuria or vomiting. Her sexual activity is non-contributory to the current illness. No, her partner does not have an STD. Her menstrual history has been irregular. Her past medical history is significant for an abdominal surgery (hx cholecystectomy. ).  : 30yo obese female presents with mild LLQ abdominal and pelvic pain for last 2 weeks, rated 3/10 pain tonight. LMP 02/05/2011, with history of irregular periods and PCOS. Last reported intercourse October 2012, Pt is married and monogamous.   Pertinent Gynecological History: Menses: irregular occurring approximately every 28 to 60 days without intermenstrual spotting and with minimal cramping Bleeding: none today.  Contraception: none DES exposure: denies Blood transfusions: none Sexually transmitted diseases: no past history Previous GYN Procedures: none  Last pap: Pt unsure of results,  done early 2012.  Date: unsure   Past Medical History  Diagnosis Date  . Hypertension   . Diabetes mellitus   . PCOS (polycystic ovarian syndrome)     Past Surgical History  Procedure Date  . Carpal tunnel release   . Cholecystectomy   . Bladder repair     Family History  Problem Relation Age of Onset  . Hypertension Mother   . Diabetes Paternal Aunt   . Cancer Maternal Grandmother   . Stroke Maternal Grandfather   . Diabetes Paternal Grandmother   . Stroke Paternal Grandmother     History  Substance Use Topics  . Smoking status: Current Everyday Smoker -- 1.0 packs/day for 18 years    Types: Cigarettes  . Smokeless tobacco: Not on file  . Alcohol Use: Yes    Allergies: No Known Allergies  Prescriptions prior to admission  Medication Sig Dispense Refill  . hydrochlorothiazide (HYDRODIURIL) 12.5 MG tablet Take 12.5 mg by mouth daily.        Marland Kitchen ibuprofen (ADVIL,MOTRIN) 200 MG tablet Take 400 mg by mouth every 6 (six) hours as needed. For pain       . metFORMIN (GLUMETZA) 500 MG (MOD) 24 hr tablet Take 500 mg by mouth 2 (two) times daily with a meal.          Review of Systems  Constitutional: Negative for fever, weight loss and malaise/fatigue.  Respiratory: Negative for cough and shortness of breath.   Cardiovascular: Negative for chest pain and palpitations.  Gastrointestinal: Positive for nausea (slightly nauseated. ), abdominal pain and diarrhea (pt reports mild diarrhea for the last few days. ). Negative for vomiting.  Genitourinary: Positive for vaginal bleeding. Negative  for dysuria, frequency and hematuria.  Neurological: Negative.  Negative for headaches.   Physical Exam   Blood pressure 129/76, pulse 94, temperature 97.5 F (36.4 C), temperature source Oral, resp. rate 18, height 5\' 2"  (1.575 m), weight 111.948 kg (246 lb 12.8 oz), last menstrual period 02/05/2011.  Physical Exam  Constitutional: She is oriented to person, place, and time. She appears  well-developed and well-nourished. No distress.  HENT:  Head: Normocephalic and atraumatic.  Eyes: Pupils are equal, round, and reactive to light.  Neck: Normal range of motion. Neck supple.  Cardiovascular: Normal rate, regular rhythm, normal heart sounds and intact distal pulses.   Respiratory: Effort normal and breath sounds normal.  GI: Soft. Bowel sounds are normal. She exhibits no distension and no mass. There is no tenderness. There is no rebound and no guarding.  Genitourinary: Vagina normal and uterus normal. Uterus is not tender. Cervix exhibits no motion tenderness, no discharge and no friability. Right adnexum displays no mass, no tenderness and no fullness. Left adnexum displays no mass, no tenderness and no fullness. No vaginal discharge found.  Musculoskeletal: Normal range of motion.  Neurological: She is alert and oriented to person, place, and time. She has normal reflexes.  Skin: Skin is warm and dry.  Psychiatric: She has a normal mood and affect. Her behavior is normal. Judgment and thought content normal.    MAU Course  Procedures   Assessment and Plan  Irregular periods and pelvic/abdominal pain, Possible ovarian cyst. Labs drawn and pending, CBC, CMET, Wet prep, GC/CT obtained. Will DC home with negative labs, to follow-up in office with provider this week.   Potassium 3.1, mild hypokalemia. Will treat with Kdur x 3 days PO, Pt to f/u in office later this week.   Anice Paganini CNM 04/09/2011, 3:07 AM

## 2011-04-09 NOTE — Progress Notes (Signed)
Pt c/o lower left abd pain for two weeks  And spotting for one week.

## 2011-04-09 NOTE — Progress Notes (Signed)
Pt LMP 9/17, having LLQ pain X2 wks and spotting x 1wk.  Home UPT neg.  G2 P2

## 2011-04-10 LAB — GC/CHLAMYDIA PROBE AMP, GENITAL
Chlamydia, DNA Probe: NEGATIVE
GC Probe Amp, Genital: NEGATIVE

## 2011-06-10 ENCOUNTER — Emergency Department (INDEPENDENT_AMBULATORY_CARE_PROVIDER_SITE_OTHER)
Admission: EM | Admit: 2011-06-10 | Discharge: 2011-06-10 | Disposition: A | Discharge status: Home / Self Care | Payer: Medicaid Other | Source: Home / Self Care

## 2011-06-10 ENCOUNTER — Encounter (HOSPITAL_COMMUNITY): Payer: Self-pay | Admitting: *Deleted

## 2011-06-10 DIAGNOSIS — I10 Essential (primary) hypertension: Secondary | ICD-10-CM

## 2011-06-10 DIAGNOSIS — H6090 Unspecified otitis externa, unspecified ear: Secondary | ICD-10-CM

## 2011-06-10 DIAGNOSIS — H60399 Other infective otitis externa, unspecified ear: Secondary | ICD-10-CM

## 2011-06-10 MED ORDER — HYDROCHLOROTHIAZIDE 12.5 MG PO TABS: 12.5000 mg | ORAL_TABLET | Freq: Every day | ORAL | Status: DC

## 2011-06-10 MED ORDER — HYDROCODONE-ACETAMINOPHEN 5-325 MG PO TABS: 2.0000 | ORAL_TABLET | ORAL | Status: DC | PRN

## 2011-06-10 MED ORDER — NEOMYCIN-POLYMYXIN-HC 3.5-10000-1 OT SUSP: 4.0000 [drp] | Freq: Four times a day (QID) | OTIC | Status: AC

## 2011-06-10 NOTE — ED Notes (Addendum)
C/O left earache and pruritis x 1 wk.  Denies any cold sxs or fever.  Reports being out of HTN med x 2 wks.

## 2011-06-10 NOTE — ED Provider Notes (Signed)
History     CSN: [id number]  Arrival date & time 06/10/11  1559   None     Chief Complaint  Patient presents with  . Otalgia    (Consider location/radiation/quality/duration/timing/severity/associated sxs/prior treatment) HPI Comments: Pt also out of high blood pressure medicine, HCTZ 12.5mg  for 2 weeks. Has appt with pcp this week.   Patient is a 31 y.o. female presenting with ear pain. The history is provided by the patient.  Otalgia This is a new problem. The current episode started more than 1 week ago. There is pain in the left ear. The problem occurs constantly. The problem has been gradually worsening. There has been no fever. The pain is at a severity of 8/10. Pertinent negatives include no ear discharge, no headaches, no sore throat and no cough. Associated symptoms comments: Pain radiates to L ear.    Past Medical History  Diagnosis Date  . Hypertension   . Diabetes mellitus   . PCOS (polycystic ovarian syndrome)     Past Surgical History  Procedure Date  . Carpal tunnel release   . Cholecystectomy   . Bladder repair     Family History  Problem Relation Age of Onset  . Hypertension Mother   . Diabetes Paternal Aunt   . Cancer Maternal Grandmother   . Stroke Maternal Grandfather   . Diabetes Paternal Grandmother   . Stroke Paternal Grandmother     History  Substance Use Topics  . Smoking status: Current Everyday Smoker -- 1.0 packs/day for 18 years    Types: Cigarettes  . Smokeless tobacco: Not on file  . Alcohol Use: Yes     rare    OB History    Grav Para Term Preterm Abortions TAB SAB Ect Mult Living   2 2 2       2       Review of Systems  Constitutional: Negative for fever and chills.  HENT: Positive for ear pain. Negative for congestion, sore throat and ear discharge.   Respiratory: Negative for cough.   Neurological: Negative for headaches.    Allergies  Review of patient's allergies indicates no known allergies.  Home Medications     Current Outpatient Rx  Name Route Sig Dispense Refill  . HYDROCHLOROTHIAZIDE 12.5 MG PO TABS Oral Take 12.5 mg by mouth daily.      . IBUPROFEN 200 MG PO TABS Oral Take 400 mg by mouth every 6 (six) hours as needed. For pain     . METFORMIN HCL ER (MOD) 500 MG PO TB24 Oral Take 500 mg by mouth 2 (two) times daily with a meal.      . HYDROCHLOROTHIAZIDE 12.5 MG PO TABS Oral Take 1 tablet (12.5 mg total) by mouth daily. 30 tablet 0  . NEOMYCIN-POLYMYXIN-HC 3.5-10000-1 OT SUSP Left Ear Place 4 drops into the left ear 4 (four) times daily. 10 mL 0  . POTASSIUM CHLORIDE CRYS ER 20 MEQ PO TBCR Oral Take 1 tablet (20 mEq total) by mouth daily. 3 tablet 0    BP 166/107  Pulse 76  Temp(Src) 98.6 F (37 C) (Oral)  Resp 20  SpO2 98%  LMP 05/29/2011  Physical Exam  Constitutional: She appears well-developed and well-nourished. No distress.  HENT:  Right Ear: Hearing, tympanic membrane, external ear and ear canal normal.  Left Ear: Hearing normal. There is drainage and swelling.       Small slice of L TM visible, appears normal pearly gray.  Copious exudate in L ear  canal, but canal not completely occluded. Pain with tragal pressure, mild swelling to pinna  Cardiovascular: Normal rate and normal heart sounds.   Pulmonary/Chest: Effort normal and breath sounds normal.  Lymphadenopathy:       Head (right side): No preauricular and no posterior auricular adenopathy present.       Head (left side): No preauricular and no posterior auricular adenopathy present.    ED Course  Procedures (including critical care time)  Refill pt's bp med.  Pt will get bp rechecked at appt with pcp this week.   Labs Reviewed - No data to display No results found.   1. Otitis externa   2. Hypertension       MDM          Cathlyn Parsons, NP 06/10/11 2025

## 2011-06-12 NOTE — ED Provider Notes (Signed)
Medical screening examination/treatment/procedure(s) were performed by non-physician practitioner and as supervising physician I was immediately available for consultation/collaboration.   Barkley Bruns MD.    Barkley Bruns, MD 06/12/11 [phone]

## 2011-06-15 ENCOUNTER — Other Ambulatory Visit: Payer: Self-pay

## 2011-06-15 ENCOUNTER — Emergency Department (HOSPITAL_COMMUNITY)
Admission: EM | Admit: 2011-06-15 | Discharge: 2011-06-15 | Disposition: A | Discharge status: Home / Self Care | Payer: Medicaid Other | Source: Home / Self Care | Attending: Emergency Medicine | Admitting: Emergency Medicine

## 2011-06-15 ENCOUNTER — Encounter (HOSPITAL_COMMUNITY): Payer: Self-pay | Admitting: *Deleted

## 2011-06-15 DIAGNOSIS — F411 Generalized anxiety disorder: Secondary | ICD-10-CM

## 2011-06-15 DIAGNOSIS — R000 Tachycardia, unspecified: Secondary | ICD-10-CM

## 2011-06-15 DIAGNOSIS — I498 Other specified cardiac arrhythmias: Secondary | ICD-10-CM

## 2011-06-15 DIAGNOSIS — F419 Anxiety disorder, unspecified: Secondary | ICD-10-CM

## 2011-06-15 MED ORDER — CLONAZEPAM 0.5 MG PO TABS: 0.5000 mg | ORAL_TABLET | Freq: Two times a day (BID) | ORAL | Status: AC

## 2011-06-15 NOTE — ED Provider Notes (Signed)
Michelle Houston is a 31 y.o. female who presents to Urgent Care today for tachy palpitations.  Michelle Houston father is currently being evaluated for a lung mass that is suspicious for lung cancer. Michelle Houston is very anxious about this and over the past few days has noted tachypalpitations.  Michelle Houston feels that her heart is beating out of her chest sometimes.  Michelle Houston measured her blood pressure this morning and it was 169/94 and her heart rate was 110.  Michelle Houston feels anxious about her father and about her blood pressure and heart rate.  Michelle Houston denies any current chest pain but yesterday Michelle Houston did note some nonradiating nonexertional chest pressure which has resolved. Michelle Houston has no known cardiac disease aside from hypertension and has not seen a cardiologist.    Additionally Michelle Houston notes decreased sleep and anxiety.  Michelle Houston notes decreased need for sleep recently as well it is associated with some racing thoughts.  A mood disorder questionnaire is 7 with clustered symptoms and moderate problems.  Michelle Houston denies any hallucinations delusions suicidal or homicidal ideation.   PMH reviewed.  ROS as above otherwise neg Medications reviewed. No current facility-administered medications for this encounter.   Current Outpatient Prescriptions  Medication Sig Dispense Refill  . hydrochlorothiazide (HYDRODIURIL) 12.5 MG tablet Take 12.5 mg by mouth daily.        . hydrochlorothiazide (HYDRODIURIL) 12.5 MG tablet Take 1 tablet (12.5 mg total) by mouth daily.  30 tablet  0  . ibuprofen (ADVIL,MOTRIN) 200 MG tablet Take 400 mg by mouth every 6 (six) hours as needed. For pain       . metFORMIN (GLUMETZA) 500 MG (MOD) 24 hr tablet Take 500 mg by mouth 2 (two) times daily with a meal.        . neomycin-polymyxin-hydrocortisone (CORTISPORIN) 3.5-10000-1 otic suspension Place 4 drops into the left ear 4 (four) times daily.  10 mL  0  . potassium chloride SA (K-DUR,KLOR-CON) 20 MEQ tablet Take 1 tablet (20 mEq total) by mouth daily.  3 tablet  0     Exam:  BP 130/89  Pulse 98  Temp(Src) 98.3 F (36.8 C) (Oral)  Resp 20  SpO2 100%  LMP 05/29/2011 Gen: Well NAD HEENT: EOMI,  MMM Lungs: CTABL Nl WOB Heart: tachy but regular no MRG Abd: NABS, NT, ND Exts: Non edematous BL  LE, warm and well perfused.  Psych: Alert and oriented x3 though process is normal speech is normal no delusions or hallucinations expressed no suicidality or homicidality   EKG: Sinus tach in the one teens with flattened T waves laterally but otherwise not significantly abnormal. No ST segment elevations or depressions  Assessment and Plan: 45 -year-old woman with tachycardia and anxiety.  Her anxiety is certainly appropriate given the seriousness of her father's illness and impending diagnosis.  Her mood disorder questionnaire is equivocal for suspicion for mania however Michelle Houston appears appropriate in the office today.  I think her anxiety is causing her tachycardia, as her tachycardia worsens when Michelle Houston talks about her father or we do tests like EKG and blood pressure checks.  I think the appropriate treatment at this point is for her anxiety with clonazepam 0.5 mg twice daily until Michelle Houston sees her primary care doctor next week.  At that point her primary care doctor may recommend that Michelle Houston gets a stress test with the cardiologist however I feel the chances of coronary artery disease are very likely this 31 year old woman.  We discuss red flag signs or symptoms such  as tachypnea and worsening tachycardia or chest pain. Michelle Houston expresses understanding.     Clementeen Graham, MD 06/15/11 [phone]

## 2011-06-15 NOTE — ED Provider Notes (Signed)
Medical screening examination/treatment/procedure(s) were performed by non-physician practitioner and as supervising physician I was immediately available for consultation/collaboration.  Hillery Hunter, MD 06/15/11 [phone]

## 2011-06-15 NOTE — ED Notes (Signed)
Pt  Reports   That    She   Had  Episode    Of   Rapid       Heartbeat  And     Some  Chest  Tightness    Earlier      -     The    Pt     Reports  She  Has  Been  Under  Stress       Recently    Due  To fathers  Illness

## 2011-08-08 ENCOUNTER — Encounter (HOSPITAL_COMMUNITY): Payer: Self-pay | Admitting: Emergency Medicine

## 2011-08-08 ENCOUNTER — Emergency Department (HOSPITAL_COMMUNITY)
Admission: EM | Admit: 2011-08-08 | Discharge: 2011-08-08 | Disposition: A | Discharge status: Home / Self Care | Payer: Medicaid Other | Source: Home / Self Care | Attending: Family Medicine | Admitting: Family Medicine

## 2011-08-08 DIAGNOSIS — L732 Hidradenitis suppurativa: Secondary | ICD-10-CM

## 2011-08-08 MED ORDER — DOXYCYCLINE HYCLATE 100 MG PO CAPS: 100.0000 mg | ORAL_CAPSULE | Freq: Two times a day (BID) | ORAL | Status: AC

## 2011-08-08 MED ORDER — HYDROCODONE-ACETAMINOPHEN 5-500 MG PO TABS: 1.0000 | ORAL_TABLET | Freq: Four times a day (QID) | ORAL | Status: AC | PRN

## 2011-08-08 MED ORDER — METRONIDAZOLE 0.75 % EX GEL: Freq: Two times a day (BID) | CUTANEOUS | Status: DC

## 2011-08-08 NOTE — Discharge Instructions (Signed)
Keep area clean and dry. Can clean with soap and water dry well before applying day antibiotic gel and cover with a dry dressing. Take the prescribed medications as instructed. Avoid shaving. Call center correlating a surgery for an appointment if recurrent symptoms. Or return earlier if worsening symptoms like fever, chills worsening pain redness swelling or discharge.    Hidradenitis Suppurativa, Sweat Gland Abscess Hidradenitis suppurativa is a long lasting (chronic), uncommon disease of the sweat glands. With this, boil-like lumps and scarring develop in the groin, some times under the arms (axillae), and under the breasts. It may also uncommonly occur behind the ears, in the crease of the buttocks, and around the genitals.  CAUSES  The cause is from a blocking of the sweat glands. They then become infected. It may cause drainage and odor. It is not contagious. So it cannot be given to someone else. It most often shows up in puberty (about 69 to 32 years of age). But it may happen much later. It is similar to acne which is a disease of the sweat glands. This condition is slightly more common in African-Americans and women. SYMPTOMS   Hidradenitis usually starts as one or more red, tender, swellings in the groin or under the arms (axilla).   Over a period of hours to days the lesions get larger. They often open to the skin surface, draining clear to yellow-colored fluid.   The infected area heals with scarring.  DIAGNOSIS  Your caregiver makes this diagnosis by looking at you. Sometimes cultures (growing germs on plates in the lab) may be taken. This is to see what germ (bacterium) is causing the infection.  TREATMENT   Topical germ killing medicine applied to the skin (antibiotics) are the treatment of choice. Antibiotics taken by mouth (systemic) are sometimes needed when the condition is getting worse or is severe.   Avoid tight-fitting clothing which traps moisture in.   Dirt does  not cause hidradenitis and it is not caused by poor hygiene.   Involved areas should be cleaned daily using an antibacterial soap. Some patients find that the liquid form of Lever 2000, applied to the involved areas as a lotion after bathing, can help reduce the odor related to this condition.   Sometimes surgery is needed to drain infected areas or remove scarred tissue. Removal of large amounts of tissue is used only in severe cases.   Birth control pills may be helpful.   Oral retinoids (vitamin A derivatives) for 6 to 12 months which are effective for acne may also help this condition.   Weight loss will improve but not cure hidradenitis. It is made worse by being overweight. But the condition is not caused by being overweight.   This condition is more common in people who have had acne.   It may become worse under stress.  There is no medical cure for hidradenitis. It can be controlled, but not cured. The condition usually continues for years with periods of getting worse and getting better (remission). Document Released: 12/20/2003 Document Revised: 04/26/2011 Document Reviewed: 01/05/2008 [facility] Patient Information 2012 [place], [place].

## 2011-08-08 NOTE — ED Notes (Signed)
Antibiotic ointment and non-adherent dressing placed on patient's left groin

## 2011-08-08 NOTE — ED Notes (Signed)
Pt c/o pain in groin area d/t small swollen nodes, both sides are sore, left side is worse. Pt stated "the lymph nodes are swollen and have been leaking fluid". Pt stated "I had this problem before, usually it resolves itself but this time its more painful and taking longer to go away". Pt took ibuprofen at 1500 to help decrease pain.

## 2011-08-08 NOTE — ED Provider Notes (Signed)
History     CSN: [id number]  Arrival date & time 08/08/11  1744   First MD Initiated Contact with Patient 08/08/11 1758      Chief Complaint  Patient presents with  . Groin Pain    (Consider location/radiation/quality/duration/timing/severity/associated sxs/prior treatment) HPI Comments: 31 year old diabetic obese female. Here complaining of pain and drainage in her left groin. States she has had recurrent swelling glands in her groins but usually resolve itself. This time pain has persisted and drainage for the last 3 days. Denies fever or chills. Taking ibuprofen for pain. Diabetes has been well-controlled as per patient report her hemoglobin A1c was 5.5 one month ago her home CBGs are below 200. Patient states that she states her groins regularly.   Past Medical History  Diagnosis Date  . Hypertension   . Diabetes mellitus   . PCOS (polycystic ovarian syndrome)     Past Surgical History  Procedure Date  . Carpal tunnel release   . Cholecystectomy   . Bladder repair     Family History  Problem Relation Age of Onset  . Hypertension Mother   . Diabetes Paternal Aunt   . Cancer Maternal Grandmother   . Stroke Maternal Grandfather   . Diabetes Paternal Grandmother   . Stroke Paternal Grandmother   . Cancer Father     History  Substance Use Topics  . Smoking status: Current Everyday Smoker -- 1.0 packs/day for 18 years    Types: Cigarettes  . Smokeless tobacco: Not on file  . Alcohol Use: Yes     rare    OB History    Grav Para Term Preterm Abortions TAB SAB Ect Mult Living   2 2 2       2       Review of Systems  Constitutional: Negative for fever and chills.  Gastrointestinal: Negative for nausea.  Skin:       As per HPI  Neurological: Negative for headaches.  All other systems reviewed and are negative.    Allergies  Review of patient's allergies indicates no known allergies.  Home Medications   Current Outpatient Rx  Name Route Sig Dispense  Refill  . DOXYCYCLINE HYCLATE 100 MG PO CAPS Oral Take 1 capsule (100 mg total) by mouth 2 (two) times daily. 20 capsule 0  . HYDROCHLOROTHIAZIDE 12.5 MG PO TABS Oral Take 12.5 mg by mouth daily.      Marland Kitchen HYDROCHLOROTHIAZIDE 12.5 MG PO TABS Oral Take 1 tablet (12.5 mg total) by mouth daily. 30 tablet 0  . HYDROCODONE-ACETAMINOPHEN 5-500 MG PO TABS Oral Take 1-2 tablets by mouth every 6 (six) hours as needed for pain. 15 tablet 0  . IBUPROFEN 200 MG PO TABS Oral Take 400 mg by mouth every 6 (six) hours as needed. For pain     . METFORMIN HCL ER (MOD) 500 MG PO TB24 Oral Take 500 mg by mouth 2 (two) times daily with a meal.      . METRONIDAZOLE 0.75 % EX GEL Topical Apply topically 2 (two) times daily. 45 g 0  . POTASSIUM CHLORIDE CRYS ER 20 MEQ PO TBCR Oral Take 1 tablet (20 mEq total) by mouth daily. 3 tablet 0    BP 135/86  Pulse 115  Temp(Src) 97.6 F (36.4 C) (Oral)  Resp 16  SpO2 96%  LMP 07/27/2011  Physical Exam  Nursing note and vitals reviewed. Constitutional: She is oriented to person, place, and time. She appears well-developed and well-nourished. No distress.  obese  Cardiovascular: Normal heart sounds.   Pulmonary/Chest: Breath sounds normal.  Abdominal: Soft. There is no tenderness.  Neurological: She is alert and oriented to person, place, and time.  Skin:       Left groin: tender area with erythema, mild induration and two openings with scant purulent drainage. No significant fluctuation or induration. Perineal areas are shaved. No enlarged lymph nodes.     ED Course  Procedures (including critical care time)  Labs Reviewed - No data to display No results found.   1. Hydradenitis       MDM  Impress hydradenitis suppurative. With spontaneus draining. No large collected abscess felt on examination. Asked to avoid shaving. Prescribed doxycycline, Vicodin and MetroGel. [facility] [place] surgery referral if recurrent.        Sharin Grave,  MD 08/10/11 [phone]

## 2011-10-11 ENCOUNTER — Encounter (HOSPITAL_BASED_OUTPATIENT_CLINIC_OR_DEPARTMENT_OTHER): Payer: Self-pay | Admitting: *Deleted

## 2011-10-11 ENCOUNTER — Emergency Department (HOSPITAL_BASED_OUTPATIENT_CLINIC_OR_DEPARTMENT_OTHER): Payer: Medicaid Other

## 2011-10-11 ENCOUNTER — Emergency Department (HOSPITAL_BASED_OUTPATIENT_CLINIC_OR_DEPARTMENT_OTHER)
Admission: EM | Admit: 2011-10-11 | Discharge: 2011-10-12 | Disposition: A | Discharge status: Home / Self Care | Payer: Medicaid Other | Attending: Emergency Medicine | Admitting: Emergency Medicine

## 2011-10-11 DIAGNOSIS — R079 Chest pain, unspecified: Secondary | ICD-10-CM | POA: Insufficient documentation

## 2011-10-11 DIAGNOSIS — I498 Other specified cardiac arrhythmias: Secondary | ICD-10-CM | POA: Insufficient documentation

## 2011-10-11 DIAGNOSIS — E282 Polycystic ovarian syndrome: Secondary | ICD-10-CM | POA: Insufficient documentation

## 2011-10-11 DIAGNOSIS — E119 Type 2 diabetes mellitus without complications: Secondary | ICD-10-CM | POA: Insufficient documentation

## 2011-10-11 DIAGNOSIS — Z79899 Other long term (current) drug therapy: Secondary | ICD-10-CM | POA: Insufficient documentation

## 2011-10-11 DIAGNOSIS — R9431 Abnormal electrocardiogram [ECG] [EKG]: Secondary | ICD-10-CM

## 2011-10-11 DIAGNOSIS — I10 Essential (primary) hypertension: Secondary | ICD-10-CM | POA: Insufficient documentation

## 2011-10-11 DIAGNOSIS — F17200 Nicotine dependence, unspecified, uncomplicated: Secondary | ICD-10-CM | POA: Insufficient documentation

## 2011-10-11 LAB — CBC
MCH: 30.1 pg (ref 26.0–34.0)
MCV: 83.8 fL (ref 78.0–100.0)
Platelets: 369 10*3/uL (ref 150–400)
RDW: 12 % (ref 11.5–15.5)
WBC: 7.1 10*3/uL (ref 4.0–10.5)

## 2011-10-11 LAB — BASIC METABOLIC PANEL
Calcium: 10 mg/dL (ref 8.4–10.5)
Creatinine, Ser: 0.6 mg/dL (ref 0.50–1.10)
GFR calc Af Amer: >90 mL/min (ref >90)

## 2011-10-11 LAB — TROPONIN I: Troponin I: <0.3 ng/mL (ref <0.30)

## 2011-10-11 MED ORDER — POTASSIUM CHLORIDE CRYS ER 20 MEQ PO TBCR
40.0000 meq | EXTENDED_RELEASE_TABLET | Freq: Once | ORAL | Status: AC
  Administered 2011-10-12: 40 meq via ORAL
  Filled 2011-10-11: qty 2

## 2011-10-11 NOTE — ED Notes (Signed)
Pt reports chest pain upon inspiration, also reports diarhea x 1 week, took pepto today with no relief

## 2011-10-12 NOTE — ED Notes (Signed)
Pt refused to stay and have second troponin level drawn. MD discussed test results with patient and ekg, explained need for second troponin, pt verbalized understanding and expressed desire to cont. To leave AMA

## 2011-10-12 NOTE — ED Provider Notes (Signed)
History     CSN: [id number]  Arrival date & time 10/11/11  2145   First MD Initiated Contact with Patient 10/11/11 2339      No chief complaint on file.   (Consider location/radiation/quality/duration/timing/severity/associated sxs/prior treatment) Patient is a 31 y.o. female presenting with chest pain. The history is provided by the patient. No language interpreter was used.  Chest Pain The chest pain began more  than 1 month ago. Episode Length: episodic pain lasting 30-40 minutes at a time. Chest pain occurs intermittently. The chest pain is unchanged. The pain is associated with breathing. At its most intense, the pain is at 7/10. The pain is currently at 0/10. The severity of the pain is severe. The quality of the pain is described as dull. The pain does not radiate. Chest pain is worsened by smoking. Pertinent negatives for primary symptoms include no shortness of breath, no nausea and no vomiting.  Pertinent negatives for associated symptoms include no diaphoresis. She tried nothing for the symptoms. Risk factors include obesity and smoking/tobacco exposure.  Her past medical history is significant for diabetes.  Pertinent negatives for family medical history include: no Marfan's syndrome in family.  Procedure history is negative for cardiac catheterization.     Past Medical History  Diagnosis Date  . Hypertension   . Diabetes mellitus   . PCOS (polycystic ovarian syndrome)     Past Surgical History  Procedure Date  . Carpal tunnel release   . Cholecystectomy   . Bladder repair     Family History  Problem Relation Age of Onset  . Hypertension Mother   . Diabetes Paternal Aunt   . Cancer Maternal Grandmother   . Stroke Maternal Grandfather   . Diabetes Paternal Grandmother   . Stroke Paternal Grandmother   . Cancer Father     History  Substance Use Topics  . Smoking status: Current Everyday Smoker -- 1.0 packs/day for 18 years    Types: Cigarettes  .  Smokeless tobacco: Not on file  . Alcohol Use: Yes     rare    OB History    Grav Para Term Preterm Abortions TAB SAB Ect Mult Living   2 2 2       2       Review of Systems  Constitutional: Negative for diaphoresis.  Respiratory: Negative for shortness of breath.   Cardiovascular: Positive for chest pain.  Gastrointestinal: Negative for nausea and vomiting.  All other systems reviewed and are negative.    Allergies  Review of patient's allergies indicates no known allergies.  Home Medications   Current Outpatient Rx  Name Route Sig Dispense Refill  . ALBUTEROL SULFATE (2.5 MG/3ML) 0.083% IN NEBU Nebulization Take 2.5 mg by nebulization every 6 (six) hours as needed. For cough    . IBUPROFEN 800 MG PO TABS Oral Take 800 mg by mouth every 8 (eight) hours as needed. For pain    . LOSARTAN POTASSIUM-HCTZ 50-12.5 MG PO TABS Oral Take 1 tablet by mouth daily.    Marland Kitchen METFORMIN HCL 500 MG PO TABS Oral Take 500 mg by mouth 2 (two) times daily.    . OXYCODONE-ACETAMINOPHEN 10-325 MG PO TABS Oral Take 1 tablet by mouth once as needed. For pain    . POTASSIUM CHLORIDE CRYS ER 20 MEQ PO TBCR Oral Take 1 tablet (20 mEq total) by mouth daily. 3 tablet 0    BP 130/81  Pulse 98  Temp(Src) 98 F (36.7 C) (Oral)  Resp 20  SpO2 99%  LMP 10/04/2011  Physical Exam  Constitutional: She is oriented to person, place, and time. She appears well-developed and well-nourished. No distress.  HENT:  Head: Normocephalic and atraumatic.  Mouth/Throat: Oropharynx is clear and moist.  Eyes: Conjunctivae are normal. Pupils are equal, round, and reactive to light.  Neck: Normal range of motion. Neck supple.  Cardiovascular: Normal rate and regular rhythm.   Pulmonary/Chest: Effort normal and breath sounds normal.  Abdominal: Soft. Bowel sounds are normal. There is no tenderness. There is no rebound and no guarding.  Musculoskeletal: Normal range of motion.  Neurological: She is alert and oriented to  person, place, and time.  Skin: Skin is warm and dry. She is not diaphoretic.  Psychiatric: She has a normal mood and affect.    ED Course  Procedures (including critical care time)  Labs Reviewed  CBC - Abnormal; Notable for the following:    RBC 5.12 (*)    Hemoglobin 15.4 (*)    All other components within normal limits  BASIC METABOLIC PANEL - Abnormal; Notable for the following:    Potassium 2.8 (*)    Chloride 94 (*)    Glucose, Bld 130 (*)    All other components within normal limits  TROPONIN I  PREGNANCY, URINE  D-DIMER, QUANTITATIVE   Dg Chest 2 View  10/11/2011  *RADIOLOGY REPORT*  Clinical Data: Chest pain.  Shortness of breath.  Smoker with history of hypertension.  CHEST - 2 VIEW  Comparison: Two-view chest x-ray 07/29/2010, 03/04/2010, 06/18/2009, 11/30/2008.  Findings: Cardiomediastinal silhouette unremarkable, unchanged. Lungs clear.  Bronchovascular markings normal.  Pulmonary vascularity normal.  No pneumothorax.  No pleural effusions. Degenerative changes involving the thoracic spine.  No significant interval change.  IMPRESSION: No acute cardiopulmonary disease.  Stable examination.  Original Report Authenticated By: Arnell Sieving, M.D.     No diagnosis found.    MDM   Date: 10/12/2011  Rate: 109  Rhythm: sinus tachycardia  QRS Axis: normal  Intervals: normal  ST/T Wave abnormalities: ST depressions inferiorly and ST depressions laterally  Conduction Disutrbances:none  Narrative Interpretation: slightly worse st t since 06/15/11  Old EKG Reviewed: changes noted   Give patient's past medical history and EKG EDP planned admission for CP and cardiology consult.  Patient states she will not be admitted for r/o chest pain.  She is alert and oriented x 3 and has decision making capacity to refuse further testing, transfer and treatment.  She is informed by EDP that risks of signing out against medical advice are but are not limited to death, coma, heart  attack, stroke, cardiogenic failure, congestive heart failure and prolonged pain and morbidity due to delayed diagnosis and treatment.  Patient verbalizes understanding of these risks and is willing to accept these risks and signs out against advice       Michelle Clenney K Andrian Urbach-Rasch, MD 10/12/11 [phone]

## 2011-10-16 ENCOUNTER — Emergency Department (HOSPITAL_COMMUNITY)
Admission: EM | Admit: 2011-10-16 | Discharge: 2011-10-16 | Disposition: A | Discharge status: Home / Self Care | Payer: Medicaid Other | Attending: Emergency Medicine | Admitting: Emergency Medicine

## 2011-10-16 ENCOUNTER — Encounter (HOSPITAL_COMMUNITY): Payer: Self-pay | Admitting: Emergency Medicine

## 2011-10-16 ENCOUNTER — Other Ambulatory Visit: Payer: Self-pay

## 2011-10-16 DIAGNOSIS — R079 Chest pain, unspecified: Secondary | ICD-10-CM | POA: Insufficient documentation

## 2011-10-16 NOTE — ED Notes (Signed)
Patient refused blood work tried to speak with patient can not find patient in ED waiting room.

## 2011-10-16 NOTE — ED Notes (Signed)
Onset 2 weeks ago seen at another hospital less then a week ago and states chest pain intermittent currently 3/10 throbbing.

## 2011-10-16 NOTE — ED Notes (Signed)
Called patient no response.

## 2012-01-29 ENCOUNTER — Emergency Department (HOSPITAL_BASED_OUTPATIENT_CLINIC_OR_DEPARTMENT_OTHER)
Admission: EM | Admit: 2012-01-29 | Discharge: 2012-01-30 | Disposition: A | Discharge status: Home / Self Care | Payer: Medicaid Other | Attending: Emergency Medicine | Admitting: Emergency Medicine

## 2012-01-29 ENCOUNTER — Emergency Department (HOSPITAL_BASED_OUTPATIENT_CLINIC_OR_DEPARTMENT_OTHER): Payer: Medicaid Other

## 2012-01-29 ENCOUNTER — Encounter (HOSPITAL_BASED_OUTPATIENT_CLINIC_OR_DEPARTMENT_OTHER): Payer: Self-pay

## 2012-01-29 DIAGNOSIS — Z9089 Acquired absence of other organs: Secondary | ICD-10-CM | POA: Insufficient documentation

## 2012-01-29 DIAGNOSIS — I10 Essential (primary) hypertension: Secondary | ICD-10-CM | POA: Insufficient documentation

## 2012-01-29 DIAGNOSIS — Z79899 Other long term (current) drug therapy: Secondary | ICD-10-CM | POA: Insufficient documentation

## 2012-01-29 DIAGNOSIS — F17200 Nicotine dependence, unspecified, uncomplicated: Secondary | ICD-10-CM | POA: Insufficient documentation

## 2012-01-29 DIAGNOSIS — E119 Type 2 diabetes mellitus without complications: Secondary | ICD-10-CM | POA: Insufficient documentation

## 2012-01-29 DIAGNOSIS — R079 Chest pain, unspecified: Secondary | ICD-10-CM | POA: Insufficient documentation

## 2012-01-29 LAB — CBC WITH DIFFERENTIAL/PLATELET
Basophils Absolute: 0 10*3/uL (ref 0.0–0.1)
Eosinophils Absolute: 0.1 10*3/uL (ref 0.0–0.7)
Eosinophils Relative: 1 % (ref 0–5)
MCH: 30.2 pg (ref 26.0–34.0)
MCHC: 34.8 g/dL (ref 30.0–36.0)
MCV: 86.8 fL (ref 78.0–100.0)
Platelets: 313 10*3/uL (ref 150–400)
RDW: 12.8 % (ref 11.5–15.5)

## 2012-01-29 LAB — BASIC METABOLIC PANEL
Calcium: 9.9 mg/dL (ref 8.4–10.5)
GFR calc non Af Amer: >90 mL/min (ref >90)
Glucose, Bld: 114 mg/dL — ABNORMAL HIGH (ref 70–99)
Sodium: 139 mEq/L (ref 135–145)

## 2012-01-29 MED ORDER — POTASSIUM CHLORIDE CRYS ER 20 MEQ PO TBCR
40.0000 meq | EXTENDED_RELEASE_TABLET | Freq: Once | ORAL | Status: AC
  Administered 2012-01-29: 40 meq via ORAL
  Filled 2012-01-29: qty 2

## 2012-01-29 NOTE — ED Provider Notes (Addendum)
History     CSN: [id number]  Arrival date & time 01/29/12  2214   First MD Initiated Contact with Patient 01/29/12 2331      Chief Complaint  Patient presents with  . Chest Pain    HPI Pt has been having pain in the right chest and midline.  This has been ongoing for months.  It radiates to the back and to the arm sometimes.  When she smokes cigarettes it gets worse.  It comes and goes.  It lasts for an hour or so then resolves.  No fever.  No cough.  Pt walks at the park regularly and does notice trouble with that.  She does have family history of Cancer but no heart trouble in the immediate family.  Tonight she noticed it again so she wanted to get it checked out.  Past Medical History  Diagnosis Date  . Hypertension   . Diabetes mellitus   . PCOS (polycystic ovarian syndrome)     Past Surgical History  Procedure Date  . Carpal tunnel release   . Cholecystectomy   . Bladder repair     Family History  Problem Relation Age of Onset  . Hypertension Mother   . Diabetes Paternal Aunt   . Cancer Maternal Grandmother   . Stroke Maternal Grandfather   . Diabetes Paternal Grandmother   . Stroke Paternal Grandmother   . Cancer Father     History  Substance Use Topics  . Smoking status: Current Everyday Smoker -- 1.0 packs/day for 18 years    Types: Cigarettes  . Smokeless tobacco: Not on file  . Alcohol Use: No    OB History    Grav Para Term Preterm Abortions TAB SAB Ect Mult Living   2 2 2       2       Review of Systems  HENT:       Sometimes notice that it is hard to swallow  All other systems reviewed and are negative.    Allergies  Review of patient's allergies indicates no known allergies.  Home Medications   Current Outpatient Rx  Name Route Sig Dispense Refill  . ALBUTEROL SULFATE (2.5 MG/3ML) 0.083% IN NEBU Nebulization Take 2.5 mg by nebulization every 6 (six) hours as needed. For cough    . IBUPROFEN 800 MG PO TABS Oral Take 800 mg by mouth  every 8 (eight) hours as needed. For pain    . LOSARTAN POTASSIUM-HCTZ 50-12.5 MG PO TABS Oral Take 1 tablet by mouth daily.    Marland Kitchen METFORMIN HCL 500 MG PO TABS Oral Take 500 mg by mouth 2 (two) times daily.    Marland Kitchen POTASSIUM CHLORIDE CRYS ER 20 MEQ PO TBCR Oral Take 1 tablet (20 mEq total) by mouth daily. 3 tablet 0  . SALINE NASAL SPRAY 0.65 % NA SOLN Nasal Place 1 spray into the nose as needed. For nasal congestion.      BP 141/88  Pulse 88  Temp 98.2 F (36.8 C) (Oral)  Resp 18  Ht 5\' 2"  (1.575 m)  Wt 205 lb 4.8 oz (93.123 kg)  BMI 37.55 kg/m2  SpO2 98%  LMP 01/20/2012  Physical Exam  Nursing note and vitals reviewed. Constitutional: She appears well-developed and well-nourished. No distress.  HENT:  Head: Normocephalic and atraumatic.  Right Ear: External ear normal.  Left Ear: External ear normal.  Eyes: Conjunctivae normal are normal. Right eye exhibits no discharge. Left eye exhibits no discharge. No scleral icterus.  Neck: Neck supple. No tracheal deviation present.  Cardiovascular: Normal rate, regular rhythm and intact distal pulses.   Pulmonary/Chest: Effort normal and breath sounds normal. No stridor. No respiratory distress. She has no wheezes. She has no rales.  Abdominal: Soft. Bowel sounds are normal. She exhibits no distension. There is no tenderness. There is no rebound and no guarding.  Musculoskeletal: She exhibits no edema and no tenderness.  Neurological: She is alert. She has normal strength. No sensory deficit. Cranial nerve deficit:  no gross defecits noted. She exhibits normal muscle tone. She displays no seizure activity. Coordination normal.  Skin: Skin is warm and dry. No rash noted.  Psychiatric: She has a normal mood and affect.    ED Course  Procedures (including critical care time) EKG Rate 86 Normal sinus rhythm Cannot rule out Anterior infarct , age undetermined T wave abnormality, consider inferior ischemia Abnormal ECG T wave inversion  noted on prior EKG Labs Reviewed  CBC WITH DIFFERENTIAL - Abnormal; Notable for the following:    Neutrophils Relative 31 (*)     Lymphocytes Relative 60 (*)     All other components within normal limits  BASIC METABOLIC PANEL - Abnormal; Notable for the following:    Potassium 2.9 (*)     Chloride 95 (*)     Glucose, Bld 114 (*)     All other components within normal limits  TROPONIN I  TROPONIN I   Dg Chest 2 View  01/30/2012  *RADIOLOGY REPORT*  Clinical Data: Chest pain radiating to the upper back for 2 months.  CHEST - 2 VIEW  Comparison: 10/11/2011  Findings: The heart size and pulmonary vascularity are normal. The lungs appear clear and expanded without focal air space disease or consolidation. No blunting of the costophrenic angles.  No pneumothorax.  Mediastinal contours appear intact.  No significant change since previous study.  IMPRESSION: No evidence of active pulmonary disease.   Original Report Authenticated By: Marlon Pel, M.D.      1. Chest pain       MDM  Patient has atypical chest pain. I have low suspicion for coronary etiology, despite her hypertension and diabetes. The symptoms have been ongoing for a months.  Patient states she walks regularly and has no discomfort when this occurs . she was worried that she had lung cancer and that was the primary reason she came in this evening. Regardless, I recommended she follow up with her primary Dr. for further evaluation. She has mentioned some discomfort with swallowing and I will start her on some antacids as this could be related to esophagitis      Celene Kras, MD 01/30/12 1610  Celene Kras, MD 02/29/12 [phone]

## 2012-01-29 NOTE — ED Notes (Signed)
C/o pain to CP, upper back pain, right arm pain-pain x "few months"

## 2012-01-30 LAB — TROPONIN I: Troponin I: <0.3 ng/mL (ref <0.30)

## 2012-01-30 MED ORDER — OMEPRAZOLE 20 MG PO CPDR: 40.0000 mg | DELAYED_RELEASE_CAPSULE | Freq: Every day | ORAL | Status: DC

## 2012-01-30 NOTE — ED Notes (Signed)
Pt resting with eyes closed, denies any pain or other complaints.  IV site appears patent with no evidence of redness or swelling.

## 2012-01-30 NOTE — ED Notes (Signed)
Pt requesting to leave, Dr. Lynelle Doctor made aware.

## 2012-01-30 NOTE — ED Notes (Signed)
Repeat TRP sent to lab, pt updated on plan of care.

## 2012-02-06 ENCOUNTER — Emergency Department (HOSPITAL_BASED_OUTPATIENT_CLINIC_OR_DEPARTMENT_OTHER)
Admission: EM | Admit: 2012-02-06 | Discharge: 2012-02-06 | Disposition: A | Discharge status: Home / Self Care | Payer: Medicaid Other | Attending: Emergency Medicine | Admitting: Emergency Medicine

## 2012-02-06 ENCOUNTER — Encounter (HOSPITAL_BASED_OUTPATIENT_CLINIC_OR_DEPARTMENT_OTHER): Payer: Self-pay | Admitting: Family Medicine

## 2012-02-06 DIAGNOSIS — F17200 Nicotine dependence, unspecified, uncomplicated: Secondary | ICD-10-CM | POA: Insufficient documentation

## 2012-02-06 DIAGNOSIS — B9689 Other specified bacterial agents as the cause of diseases classified elsewhere: Secondary | ICD-10-CM | POA: Insufficient documentation

## 2012-02-06 DIAGNOSIS — B373 Candidiasis of vulva and vagina: Secondary | ICD-10-CM | POA: Insufficient documentation

## 2012-02-06 DIAGNOSIS — B3731 Acute candidiasis of vulva and vagina: Secondary | ICD-10-CM | POA: Insufficient documentation

## 2012-02-06 DIAGNOSIS — I10 Essential (primary) hypertension: Secondary | ICD-10-CM | POA: Insufficient documentation

## 2012-02-06 DIAGNOSIS — A499 Bacterial infection, unspecified: Secondary | ICD-10-CM | POA: Insufficient documentation

## 2012-02-06 DIAGNOSIS — N760 Acute vaginitis: Secondary | ICD-10-CM | POA: Insufficient documentation

## 2012-02-06 DIAGNOSIS — Z79899 Other long term (current) drug therapy: Secondary | ICD-10-CM | POA: Insufficient documentation

## 2012-02-06 DIAGNOSIS — E119 Type 2 diabetes mellitus without complications: Secondary | ICD-10-CM | POA: Insufficient documentation

## 2012-02-06 LAB — URINALYSIS, ROUTINE W REFLEX MICROSCOPIC
Ketones, ur: NEGATIVE mg/dL
Leukocytes, UA: NEGATIVE
Nitrite: NEGATIVE
Protein, ur: NEGATIVE mg/dL
Urobilinogen, UA: 0.2 mg/dL (ref 0.0–1.0)

## 2012-02-06 LAB — WET PREP, GENITAL: Clue Cells Wet Prep HPF POC: NONE SEEN

## 2012-02-06 MED ORDER — FLUCONAZOLE 150 MG PO TABS: 150.0000 mg | ORAL_TABLET | Freq: Once | ORAL | Status: DC

## 2012-02-06 MED ORDER — METRONIDAZOLE 500 MG PO TABS: 500.0000 mg | ORAL_TABLET | Freq: Two times a day (BID) | ORAL | Status: DC

## 2012-02-06 NOTE — ED Provider Notes (Signed)
History     CSN: [id number]  Arrival date & time 02/06/12  1100   First MD Initiated Contact with Patient 02/06/12 1210      Chief Complaint  Patient presents with  . Vaginal Itching    (Consider location/radiation/quality/duration/timing/severity/associated sxs/prior treatment) Patient is a 31 y.o. female presenting with vaginal discharge. The history is provided by the patient. No language interpreter was used.  Vaginal Discharge This is a new problem. The current episode started today. The problem occurs constantly. The problem has been rapidly worsening. Pertinent negatives include no abdominal pain. Associated symptoms comments: Vaginal discharge. Nothing aggravates the symptoms. She has tried nothing for the symptoms. The treatment provided moderate relief.  Pt complains of vaginal discharge.  Pt has been using monostat  Past Medical History  Diagnosis Date  . Hypertension   . Diabetes mellitus   . PCOS (polycystic ovarian syndrome)     Past Surgical History  Procedure Date  . Carpal tunnel release   . Cholecystectomy   . Bladder repair     Family History  Problem Relation Age of Onset  . Hypertension Mother   . Diabetes Paternal Aunt   . Cancer Maternal Grandmother   . Stroke Maternal Grandfather   . Diabetes Paternal Grandmother   . Stroke Paternal Grandmother   . Cancer Father     History  Substance Use Topics  . Smoking status: Current Every Day Smoker -- 1.0 packs/day for 18 years    Types: Cigarettes  . Smokeless tobacco: Not on file  . Alcohol Use: No    OB History    Grav Para Term Preterm Abortions TAB SAB Ect Mult Living   2 2 2       2       Review of Systems  Gastrointestinal: Negative for abdominal pain.  Genitourinary: Positive for vaginal discharge.  All other systems reviewed and are negative.    Allergies  Review of patient's allergies indicates no known allergies.  Home Medications   Current Outpatient Rx  Name Route Sig  Dispense Refill  . POTASSIUM CHLORIDE CRYS ER 20 MEQ PO TBCR Oral Take 1 tablet (20 mEq total) by mouth daily. 3 tablet 0  . ALBUTEROL SULFATE (2.5 MG/3ML) 0.083% IN NEBU Nebulization Take 2.5 mg by nebulization every 6 (six) hours as needed. For cough    . IBUPROFEN 800 MG PO TABS Oral Take 800 mg by mouth every 8 (eight) hours as needed. For pain    . LOSARTAN POTASSIUM-HCTZ 50-12.5 MG PO TABS Oral Take 1 tablet by mouth daily.    Marland Kitchen METFORMIN HCL 500 MG PO TABS Oral Take 500 mg by mouth 2 (two) times daily.    Marland Kitchen OMEPRAZOLE 20 MG PO CPDR Oral Take 2 capsules (40 mg total) by mouth daily. 14 capsule 0  . SALINE NASAL SPRAY 0.65 % NA SOLN Nasal Place 1 spray into the nose as needed. For nasal congestion.      BP 129/79  Pulse 86  Temp 97.8 F (36.6 C) (Oral)  Resp 20  Ht 5\' 1"  (1.549 m)  Wt 205 lb (92.987 kg)  BMI 38.73 kg/m2  SpO2 100%  LMP 01/20/2012  Physical Exam  Nursing note and vitals reviewed. Constitutional: She is oriented to person, place, and time. She appears well-developed and well-nourished.  HENT:  Head: Normocephalic and atraumatic.  Right Ear: External ear normal.  Left Ear: External ear normal.  Mouth/Throat: Oropharynx is clear and moist.  Eyes: Conjunctivae normal are normal.  Pupils are equal, round, and reactive to light.  Neck: Normal range of motion.  Cardiovascular: Normal rate.   Pulmonary/Chest: Effort normal.  Abdominal: Soft.  Genitourinary: Vaginal discharge found.       Thick white discharge.    Musculoskeletal: Normal range of motion.  Neurological: She is alert and oriented to person, place, and time.  Skin: Skin is warm.  Psychiatric: She has a normal mood and affect.    ED Course  Procedures (including critical care time)   Labs Reviewed  URINALYSIS, ROUTINE W REFLEX MICROSCOPIC  PREGNANCY, URINE  WET PREP, GENITAL  GC/CHLAMYDIA PROBE AMP, GENITAL   No results found.   1. Bacterial vaginitis   2. Yeast vaginitis       MDM     Pt given rx for flagyl and diflucan       Elson Areas, PA 02/06/12 1333  [address]onia [address], [place] 02/06/12 1339

## 2012-02-06 NOTE — ED Notes (Signed)
Pt c/o vaginal itching x 2 days. Pt sts she is treating herself with otc yeast infection med. Pt also c/o right calf pain after running up steps and sts she "pulled same calf muscle in recent past".

## 2012-02-06 NOTE — ED Provider Notes (Signed)
Medical screening examination/treatment/procedure(s) were performed by non-physician practitioner and as supervising physician I was immediately available for consultation/collaboration.  Travanti Mcmanus K Linker, MD 02/06/12 1420 

## 2012-02-07 LAB — GC/CHLAMYDIA PROBE AMP, GENITAL: Chlamydia, DNA Probe: NEGATIVE

## 2012-02-21 ENCOUNTER — Emergency Department (HOSPITAL_COMMUNITY)
Admission: EM | Admit: 2012-02-21 | Discharge: 2012-02-21 | Disposition: A | Discharge status: Home / Self Care | Payer: Medicaid Other | Attending: Emergency Medicine | Admitting: Emergency Medicine

## 2012-02-21 ENCOUNTER — Emergency Department (HOSPITAL_COMMUNITY): Payer: Medicaid Other

## 2012-02-21 ENCOUNTER — Encounter (HOSPITAL_COMMUNITY): Payer: Self-pay | Admitting: *Deleted

## 2012-02-21 DIAGNOSIS — I10 Essential (primary) hypertension: Secondary | ICD-10-CM | POA: Insufficient documentation

## 2012-02-21 DIAGNOSIS — F17200 Nicotine dependence, unspecified, uncomplicated: Secondary | ICD-10-CM | POA: Insufficient documentation

## 2012-02-21 DIAGNOSIS — J40 Bronchitis, not specified as acute or chronic: Secondary | ICD-10-CM | POA: Insufficient documentation

## 2012-02-21 DIAGNOSIS — Z79899 Other long term (current) drug therapy: Secondary | ICD-10-CM | POA: Insufficient documentation

## 2012-02-21 DIAGNOSIS — E119 Type 2 diabetes mellitus without complications: Secondary | ICD-10-CM | POA: Insufficient documentation

## 2012-02-21 DIAGNOSIS — J069 Acute upper respiratory infection, unspecified: Secondary | ICD-10-CM | POA: Insufficient documentation

## 2012-02-21 HISTORY — DX: Bronchitis, not specified as acute or chronic: J40

## 2012-02-21 MED ORDER — GUAIFENESIN ER 1200 MG PO TB12: 1.0000 | ORAL_TABLET | Freq: Two times a day (BID) | ORAL | Status: DC

## 2012-02-21 MED ORDER — ACETAMINOPHEN-CODEINE 120-12 MG/5ML PO SOLN
10.0000 mL | ORAL | Status: DC | PRN
Start: 2012-02-21 — End: 2012-05-01

## 2012-02-21 MED ORDER — HYDROCOD POLST-CHLORPHEN POLST 10-8 MG/5ML PO LQCR
5.0000 mL | Freq: Once | ORAL | Status: AC
  Administered 2012-02-21: 5 mL via ORAL
  Filled 2012-02-21: qty 5

## 2012-02-21 MED ORDER — PREDNISONE 50 MG PO TABS: 50.0000 mg | ORAL_TABLET | Freq: Every day | ORAL | Status: DC

## 2012-02-21 NOTE — ED Provider Notes (Signed)
History     CSN: [id number]  Arrival date & time 02/21/12  0223   First MD Initiated Contact with Patient 02/21/12 0340      Chief Complaint  Patient presents with  . Cough    (Consider location/radiation/quality/duration/timing/severity/associated sxs/prior treatment) HPI Patient presents to the emergency department with a cough, that began 2 days ago.  Patient, states, that she's not had any fevers, nausea, vomiting, diarrhea, chest pain, abdominal pain, weakness, headache, or blurred vision.  Patient, states she did an albuterol treatment at home.  She states, that she felt better, but her cough, was worse.  Patient denies any other treatment at home Past Medical History  Diagnosis Date  . Hypertension   . Diabetes mellitus   . PCOS (polycystic ovarian syndrome)   . Bronchitis     Past Surgical History  Procedure Date  . Carpal tunnel release   . Cholecystectomy   . Bladder repair     Family History  Problem Relation Age of Onset  . Hypertension Mother   . Diabetes Paternal Aunt   . Cancer Maternal Grandmother   . Stroke Maternal Grandfather   . Diabetes Paternal Grandmother   . Stroke Paternal Grandmother   . Cancer Father     History  Substance Use Topics  . Smoking status: Current Every Day Smoker -- 1.0 packs/day for 18 years    Types: Cigarettes  . Smokeless tobacco: Not on file  . Alcohol Use: No    OB History    Grav Para Term Preterm Abortions TAB SAB Ect Mult Living   2 2 2       2       Review of Systems All other systems negative except as documented in the HPI. All pertinent positives and negatives as reviewed in the HPI.  Allergies  Review of patient's allergies indicates no known allergies.  Home Medications   Current Outpatient Rx  Name Route Sig Dispense Refill  . ALBUTEROL SULFATE (2.5 MG/3ML) 0.083% IN NEBU Nebulization Take 2.5 mg by nebulization every 6 (six) hours as needed. For cough    . IBUPROFEN 800 MG PO TABS Oral Take  800 mg by mouth every 8 (eight) hours as needed. For pain    . LOSARTAN POTASSIUM-HCTZ 50-12.5 MG PO TABS Oral Take 1 tablet by mouth daily.    Marland Kitchen METFORMIN HCL 500 MG PO TABS Oral Take 500 mg by mouth 2 (two) times daily.    Marland Kitchen OMEPRAZOLE 20 MG PO CPDR Oral Take 2 capsules (40 mg total) by mouth daily. 14 capsule 0  . POTASSIUM CHLORIDE CRYS ER 20 MEQ PO TBCR Oral Take 1 tablet (20 mEq total) by mouth daily. 3 tablet 0  . SALINE NASAL SPRAY 0.65 % NA SOLN Nasal Place 1 spray into the nose as needed. For nasal congestion.      BP 131/67  Pulse 110  Temp 99.1 F (37.3 C)  Resp 20  SpO2 100%  LMP 01/20/2012  Physical Exam  Nursing note and vitals reviewed. Constitutional: She is oriented to person, place, and time. She appears well-developed and well-nourished. No distress.  HENT:  Head: Normocephalic and atraumatic.  Mouth/Throat: Oropharynx is clear and moist.  Eyes: Pupils are equal, round, and reactive to light.  Neck: Normal range of motion.  Cardiovascular: Normal rate, regular rhythm and normal heart sounds.  Exam reveals no gallop and no friction rub.   No murmur heard. Pulmonary/Chest: Effort normal and breath sounds normal. No respiratory distress. She  has no wheezes. She has no rales.  Lymphadenopathy:    She has no cervical adenopathy.  Neurological: She is alert and oriented to person, place, and time.  Skin: Skin is warm and dry. No rash noted.    ED Course  Procedures (including critical care time)  Labs Reviewed - No data to display Dg Chest 2 View  02/21/2012  *RADIOLOGY REPORT*  Clinical Data: Cough  CHEST - 2 VIEW  Comparison: 01/30/2012  Findings: Lungs are clear. No pleural effusion or pneumothorax. The cardiomediastinal contours are within normal limits. The visualized bones and soft tissues are without significant appreciable abnormality. Surgical clips right upper quadrant.  IMPRESSION: No radiographic evidence of acute cardiopulmonary process.   Original  Report Authenticated By: Waneta Martins, M.D.      Patient is a smoker and will be treated for bronchitis/URI.  She is told to return here for any worsening in her condition. have advised her to follow up with her primary care.    MDM  MDM Reviewed: nursing note and vitals Interpretation: x-ray            Carlyle Dolly, PA-C 02/21/12 0354

## 2012-02-21 NOTE — ED Provider Notes (Signed)
Medical screening examination/treatment/procedure(s) were performed by non-physician practitioner and as supervising physician I was immediately available for consultation/collaboration.   Marsalis Beaulieu B. Bernette Mayers, MD 02/21/12 [phone]

## 2012-02-21 NOTE — ED Notes (Signed)
Pt c/o cough x 2 days; chills; states feels like pneumonia

## 2012-05-01 ENCOUNTER — Emergency Department (HOSPITAL_COMMUNITY)
Admission: EM | Admit: 2012-05-01 | Discharge: 2012-05-02 | Disposition: A | Discharge status: Home / Self Care | Payer: Medicaid Other | Attending: Emergency Medicine | Admitting: Emergency Medicine

## 2012-05-01 ENCOUNTER — Emergency Department (HOSPITAL_COMMUNITY): Payer: Medicaid Other

## 2012-05-01 ENCOUNTER — Encounter (HOSPITAL_COMMUNITY): Payer: Self-pay

## 2012-05-01 DIAGNOSIS — J029 Acute pharyngitis, unspecified: Secondary | ICD-10-CM | POA: Insufficient documentation

## 2012-05-01 DIAGNOSIS — R51 Headache: Secondary | ICD-10-CM | POA: Insufficient documentation

## 2012-05-01 DIAGNOSIS — R059 Cough, unspecified: Secondary | ICD-10-CM | POA: Insufficient documentation

## 2012-05-01 DIAGNOSIS — J3489 Other specified disorders of nose and nasal sinuses: Secondary | ICD-10-CM | POA: Insufficient documentation

## 2012-05-01 DIAGNOSIS — R5381 Other malaise: Secondary | ICD-10-CM | POA: Insufficient documentation

## 2012-05-01 DIAGNOSIS — R5383 Other fatigue: Secondary | ICD-10-CM | POA: Insufficient documentation

## 2012-05-01 DIAGNOSIS — F17200 Nicotine dependence, unspecified, uncomplicated: Secondary | ICD-10-CM | POA: Insufficient documentation

## 2012-05-01 DIAGNOSIS — E119 Type 2 diabetes mellitus without complications: Secondary | ICD-10-CM | POA: Insufficient documentation

## 2012-05-01 DIAGNOSIS — R0602 Shortness of breath: Secondary | ICD-10-CM | POA: Insufficient documentation

## 2012-05-01 DIAGNOSIS — I10 Essential (primary) hypertension: Secondary | ICD-10-CM | POA: Insufficient documentation

## 2012-05-01 DIAGNOSIS — Z8709 Personal history of other diseases of the respiratory system: Secondary | ICD-10-CM | POA: Insufficient documentation

## 2012-05-01 DIAGNOSIS — R079 Chest pain, unspecified: Secondary | ICD-10-CM | POA: Insufficient documentation

## 2012-05-01 DIAGNOSIS — J069 Acute upper respiratory infection, unspecified: Secondary | ICD-10-CM | POA: Insufficient documentation

## 2012-05-01 DIAGNOSIS — Z79899 Other long term (current) drug therapy: Secondary | ICD-10-CM | POA: Insufficient documentation

## 2012-05-01 DIAGNOSIS — R0789 Other chest pain: Secondary | ICD-10-CM | POA: Insufficient documentation

## 2012-05-01 DIAGNOSIS — M549 Dorsalgia, unspecified: Secondary | ICD-10-CM | POA: Insufficient documentation

## 2012-05-01 DIAGNOSIS — R05 Cough: Secondary | ICD-10-CM | POA: Insufficient documentation

## 2012-05-01 DIAGNOSIS — R6883 Chills (without fever): Secondary | ICD-10-CM | POA: Insufficient documentation

## 2012-05-01 MED ORDER — HYDROCODONE-ACETAMINOPHEN 5-325 MG PO TABS
1.0000 | ORAL_TABLET | Freq: Once | ORAL | Status: AC
  Administered 2012-05-02: 1 via ORAL
  Filled 2012-05-01: qty 1

## 2012-05-01 NOTE — ED Notes (Signed)
Per pt, has had back pain x 1 month.  CXR negative at another facility.  Pt states has times of shortness of breath.  Pain under breast with what feels like "lung pain".

## 2012-05-02 LAB — BASIC METABOLIC PANEL
Calcium: 9.3 mg/dL (ref 8.4–10.5)
GFR calc non Af Amer: >90 mL/min (ref >90)
Glucose, Bld: 92 mg/dL (ref 70–99)
Sodium: 135 mEq/L (ref 135–145)

## 2012-05-02 LAB — CBC WITH DIFFERENTIAL/PLATELET
Basophils Relative: 1 % (ref 0–1)
Eosinophils Absolute: 0.1 10*3/uL (ref 0.0–0.7)
MCH: 29.9 pg (ref 26.0–34.0)
MCHC: 34.8 g/dL (ref 30.0–36.0)
Monocytes Relative: 7 % (ref 3–12)
Neutrophils Relative %: 28 % — ABNORMAL LOW (ref 43–77)
Platelets: 302 10*3/uL (ref 150–400)

## 2012-05-02 NOTE — ED Provider Notes (Signed)
History     CSN: [id number]  Arrival date & time 05/01/12  2239   First MD Initiated Contact with Patient 05/01/12 2307      Chief Complaint  Patient presents with  . Back Pain  . Shortness of Breath    (Consider location/radiation/quality/duration/timing/severity/associated sxs/prior treatment) HPI Michelle Houston is a 31 y.o. female who presents with complaint of right upper back pain for several weeks, and new onset of right chest pain, nasal congestion, sore throat, malaise. Pt states she has been seen for pain in right upper back several times, including here in ED and by her PCP. Pt states she has had several x-rays, normal each time. States she is a heavy smoker, and states "I think my pain is in my lung, i think I may have cancer." Pt reports pain is sharp, worsened with deep breathing and coughing. States she was actually seen by her PCP today, no imaging done at that time, was given pain medication that she has not yet filled. Tonight states started having nasal congestion, headache, body aches, and now having pain in right front chest and shortness of breath. States got anxious and came here to make sure she was OK. Pt does admit to a lot of stress. 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 States it was misdiagnosed first several visits and is concerned that we are missing something in her.    Past Medical History  Diagnosis Date  . Hypertension   . Diabetes mellitus   . PCOS (polycystic ovarian syndrome)   . Bronchitis     Past Surgical History  Procedure Date  . Carpal tunnel release   . Cholecystectomy   . Bladder repair     Family History  Problem Relation Age of Onset  . Hypertension Mother   . Diabetes Paternal Aunt   . Cancer Maternal Grandmother   . Stroke Maternal Grandfather   . Diabetes Paternal Grandmother   . Stroke Paternal Grandmother   . Cancer Father     History  Substance Use Topics  .  Smoking status: Current Every Day Smoker -- 1.0 packs/day for 18 years    Types: Cigarettes  . Smokeless tobacco: Not on file  . Alcohol Use: No    OB History    Grav Para Term Preterm Abortions TAB SAB Ect Mult Living   2 2 2       2       Review of Systems  Constitutional: Positive for chills. Negative for fever.  HENT: Positive for congestion, sore throat and postnasal drip. Negative for neck pain and neck stiffness.   Eyes: Negative for visual disturbance.  Respiratory: Positive for cough, chest tightness and shortness of breath. Negative for wheezing.   Cardiovascular: Positive for chest pain. Negative for palpitations and leg swelling.  Gastrointestinal: Negative for nausea, vomiting, abdominal pain and diarrhea.  Genitourinary: Negative for dysuria and vaginal pain.  Musculoskeletal: Positive for back pain.  Skin: Negative.   Neurological: Positive for weakness and headaches.  All other systems reviewed and are negative.    Allergies  Review of patient's allergies indicates no known allergies.  Home Medications   Current Outpatient Rx  Name  Route  Sig  Dispense  Refill  . ALBUTEROL SULFATE (2.5 MG/3ML) 0.083% IN NEBU   Nebulization   Take 2.5 mg by nebulization every 6 (six) hours as needed. For cough         .  IBUPROFEN 800 MG PO TABS   Oral   Take 800 mg by mouth every 8 (eight) hours as needed. For pain         . LOSARTAN POTASSIUM-HCTZ 50-12.5 MG PO TABS   Oral   Take 1 tablet by mouth daily.         Marland Kitchen METFORMIN HCL 500 MG PO TABS   Oral   Take 500 mg by mouth 2 (two) times daily.         Marland Kitchen PHENTERMINE HCL 37.5 MG PO TABS   Oral   Take 37.5 mg by mouth daily before breakfast.         . POTASSIUM CHLORIDE CRYS ER 20 MEQ PO TBCR   Oral   Take 20 mEq by mouth daily.           BP 112/78  Pulse 78  Temp 98.7 F (37.1 C) (Oral)  Resp 18  SpO2 100%  LMP 04/22/2012  Physical Exam  Nursing note and vitals reviewed. Constitutional:  She is oriented to person, place, and time. She appears well-developed and well-nourished. No distress.  HENT:  Head: Normocephalic and atraumatic.  Right Ear: Tympanic membrane, external ear and ear canal normal.  Left Ear: Tympanic membrane, external ear and ear canal normal.  Nose: Rhinorrhea present.  Mouth/Throat: Uvula is midline, oropharynx is clear and moist and mucous membranes are normal.  Eyes: Conjunctivae normal are normal.  Neck: Normal range of motion. Neck supple.  Cardiovascular: Normal rate, regular rhythm and normal heart sounds.   Pulmonary/Chest: Effort normal and breath sounds normal. No respiratory distress. She has no wheezes. She has no rales. She exhibits tenderness.       Tender to the right anterior chest. Chest wall appears normal with no bruising, swelling, rash  Abdominal: Soft. Bowel sounds are normal. She exhibits no distension. There is no tenderness. There is no rebound.  Musculoskeletal: She exhibits no edema.       No bilateral calf swelling or tenderness  Lymphadenopathy:    She has no cervical adenopathy.  Neurological: She is alert and oriented to person, place, and time.  Skin: Skin is warm and dry.  Psychiatric: She has a normal mood and affect.    ED Course  Procedures (including critical care time)  Results for orders placed during the hospital encounter of 05/01/12  CBC WITH DIFFERENTIAL      Component Value Range   WBC 6.0  4.0 - 10.5 K/uL   RBC 4.85  3.87 - 5.11 MIL/uL   Hemoglobin 14.5  12.0 - 15.0 g/dL   HCT 47.8  29.5 - 62.1 %   MCV 86.0  78.0 - 100.0 fL   MCH 29.9  26.0 - 34.0 pg   MCHC 34.8  30.0 - 36.0 g/dL   RDW 30.8  65.7 - 84.6 %   Platelets 302  150 - 400 K/uL   Neutrophils Relative 28 (*) 43 - 77 %   Neutro Abs 1.7  1.7 - 7.7 K/uL   Lymphocytes Relative 63 (*) 12 - 46 %   Lymphs Abs 3.7  0.7 - 4.0 K/uL   Monocytes Relative 7  3 - 12 %   Monocytes Absolute 0.4  0.1 - 1.0 K/uL   Eosinophils Relative 2  0 - 5 %    Eosinophils Absolute 0.1  0.0 - 0.7 K/uL   Basophils Relative 1  0 - 1 %   Basophils Absolute 0.0  0.0 - 0.1 K/uL  D-DIMER,  QUANTITATIVE      Component Value Range   D-Dimer, Quant 0.29  0.00 - 0.48 ug/mL-FEU  BASIC METABOLIC PANEL      Component Value Range   Sodium 135  135 - 145 mEq/L   Potassium 3.1 (*) 3.5 - 5.1 mEq/L   Chloride 96  96 - 112 mEq/L   CO2 27  19 - 32 mEq/L   Glucose, Bld 92  70 - 99 mg/dL   BUN 6  6 - 23 mg/dL   Creatinine, Ser 2.95  0.50 - 1.10 mg/dL   Calcium 9.3  8.4 - 62.1 mg/dL   GFR calc non Af Amer >90  >90 mL/min   GFR calc Af Amer >90  >90 mL/min   Dg Chest 2 View  05/02/2012  *RADIOLOGY REPORT*  Clinical Data: Chest pain.  Short of breath.  Intermittent shortness of breath.  CHEST - 2 VIEW  Comparison: 02/21/2012.  Findings:  Cardiopericardial silhouette within normal limits. Mediastinal contours normal. Trachea midline.  No airspace disease or effusion. Monitoring leads are projected over the chest.  IMPRESSION: No acute cardiopulmonary disease or interval change compared to prior.   Original Report Authenticated By: Andreas Newport, M.D.      Date: 05/02/2012  Rate: 68  Rhythm: normal sinus rhythm  QRS Axis: normal  Intervals: normal  ST/T Wave abnormalities: normal  Conduction Disutrbances:none  Narrative Interpretation:   Old EKG Reviewed: unchanged    1. Back pain   2. Chest pain   3. URI (upper respiratory infection)       MDM  Pt with what sounds like a chest wall pain for several weeks, cough, URI symptoms onset today. Pt's vs are all normal. She is afebrile. She is non toxic appearing. Her labs including d dimer negative. Her CXR is negative. She was reasurred. Discussed with Dr. Hyacinth Meeker who spoke with pt as well. If symptoms persist, explaine she may need further testing, but it can be done on non emergent basis. Today no signs of cancer, pneumonia, serious infection. I suspect her pain is musculoskeletal. She was given a vicodin for  pain and has pain medication at home. D/c home with close PCP follow up.   Filed Vitals:   05/01/12 2313 05/02/12 0136  BP: 123/81 112/78  Pulse: 71 78  Temp: 98.7 F (37.1 C) 98.7 F (37.1 C)  TempSrc: Oral Oral  Resp: 18 18  SpO2: 100% 100%           Lottie Mussel, PA 05/02/12 [phone]

## 2012-05-02 NOTE — ED Provider Notes (Signed)
CP and SOB - evaluated mutliple times for same - no acute findings up to this time   On exam pt has clear heart and lungs, no distress, normal VS.  I have personally interpretted the xrays and found there to be no signs of infiltrates / masses or ptx.    Medical screening examination/treatment/procedure(s) were conducted as a shared visit with non-physician practitioner(s) and myself.  I personally evaluated the patient during the encounter    Vida Roller, MD 05/02/12 [phone]

## 2012-08-01 ENCOUNTER — Inpatient Hospital Stay (HOSPITAL_COMMUNITY)
Admission: AD | Admit: 2012-08-01 | Discharge: 2012-08-01 | Disposition: A | Discharge status: Home / Self Care | Payer: Medicaid Other | Source: Ambulatory Visit | Attending: Obstetrics & Gynecology | Admitting: Obstetrics & Gynecology

## 2012-08-01 ENCOUNTER — Encounter (HOSPITAL_COMMUNITY): Payer: Self-pay

## 2012-08-01 DIAGNOSIS — L293 Anogenital pruritus, unspecified: Secondary | ICD-10-CM

## 2012-08-01 DIAGNOSIS — R109 Unspecified abdominal pain: Secondary | ICD-10-CM | POA: Insufficient documentation

## 2012-08-01 DIAGNOSIS — N898 Other specified noninflammatory disorders of vagina: Secondary | ICD-10-CM

## 2012-08-01 DIAGNOSIS — Z3202 Encounter for pregnancy test, result negative: Secondary | ICD-10-CM

## 2012-08-01 LAB — URINE MICROSCOPIC-ADD ON

## 2012-08-01 LAB — WET PREP, GENITAL
Trich, Wet Prep: NONE SEEN
Yeast Wet Prep HPF POC: NONE SEEN

## 2012-08-01 LAB — URINALYSIS, ROUTINE W REFLEX MICROSCOPIC
Hgb urine dipstick: NEGATIVE
Ketones, ur: 15 mg/dL — AB
Protein, ur: 100 mg/dL — AB
Urobilinogen, UA: 0.2 mg/dL (ref 0.0–1.0)

## 2012-08-01 MED ORDER — TERCONAZOLE 0.4 % VA CREA: 1.0000 | TOPICAL_CREAM | Freq: Every day | VAGINAL | Status: DC

## 2012-08-01 NOTE — MAU Note (Signed)
Patient states she had a positive pregnancy test at [facility] on 3-10. States she has been having cramping for 2-3 days and spotting on tissue after urinating. Had a yellow discharge yesterday and has vaginal itching.

## 2012-08-01 NOTE — MAU Note (Addendum)
Patient presents to MAU with c/o vaginal itching; states she was seen at [facility] on Monday 3/10 and was told she was pregnant.  States she was screened for STIs on Monday, but never given anything for the vaginal itching. Attempted to call [facility] office earlier today, but office is closed.  Reports mild pink spotting earlier today and also on Monday of this week.

## 2012-08-01 NOTE — MAU Provider Note (Signed)
History     CSN: [id number]  Arrival date and time: 08/01/12 1203   First Okema Rollinson Initiated Contact with Patient 08/01/12 1340      Chief Complaint  Patient presents with  . Abdominal Pain  . Vaginal Bleeding  . Vaginal Discharge   HPI Ms. Albritton is a 32 y/o female G3P2002 who presents to MAU w/ vaginal itching, mild spotting, and abdominal cramping. She reports that she recently went to her GYN for vaginal itching and was notified that she had a positive pregnancy test. She also had a home UPT yesterday that was faint, like the one in the office.  She was not treated for a yeast infection at that time, and has had persistent vaginal itching. She denies any pain, odor, or significant discharge. This morning she noted some spotting and mild lower abdominal cramping. She has had nausea and vomiting for the past 2 days, and reports feeling like she is unable to keep down solids or liquids. No dysuria, hematuria, or flank pain. She reports recent loose stools.   OB History   Grav Para Term Preterm Abortions TAB SAB Ect Mult Living   3 2 2       2       Past Medical History  Diagnosis Date  . Hypertension   . Diabetes mellitus   . PCOS (polycystic ovarian syndrome)   . Bronchitis     Past Surgical History  Procedure Laterality Date  . Carpal tunnel release    . Cholecystectomy    . Bladder repair      Family History  Problem Relation Age of Onset  . Hypertension Mother   . Diabetes Paternal Aunt   . Cancer Maternal Grandmother   . Stroke Maternal Grandfather   . Diabetes Paternal Grandmother   . Stroke Paternal Grandmother   . Cancer Father     History  Substance Use Topics  . Smoking status: Current Every Day Smoker -- 1.00 packs/day for 18 years    Types: Cigarettes  . Smokeless tobacco: Not on file  . Alcohol Use: No    Allergies: No Known Allergies  Prescriptions prior to admission  Medication Sig Dispense Refill  . labetalol (NORMODYNE) 300 MG tablet  Take 300 mg by mouth every 8 (eight) hours. Patient has not picked up prescription as of 08-01-12      . metFORMIN (GLUCOPHAGE) 500 MG tablet Take 500 mg by mouth 2 (two) times daily.        Review of Systems  Constitutional: Negative for fever and chills.  Respiratory: Negative for cough, hemoptysis, shortness of breath and wheezing.   Cardiovascular: Negative for chest pain, palpitations and leg swelling.  Gastrointestinal: Positive for nausea, vomiting and abdominal pain (lower abdominal cramping). Negative for diarrhea, constipation, blood in stool and melena.  Genitourinary: Negative for dysuria, urgency, frequency, hematuria and flank pain.       Positive for vaginal bleeding and itching   Physical Exam   Blood pressure 119/96, pulse 82, temperature 97 F (36.1 C), temperature source Oral, resp. rate 20, height 5\' 1"  (1.549 m), weight 202 lb 6.4 oz (91.808 kg), last menstrual period 06/21/2012, SpO2 100.00%.  Physical Exam  Nursing note and vitals reviewed. Constitutional: She appears well-developed and well-nourished. No distress.  HENT:  Head: Normocephalic and atraumatic.  Cardiovascular: Normal rate and regular rhythm.   Respiratory: Effort normal and breath sounds normal. No respiratory distress.  GI: Soft. Bowel sounds are normal. She exhibits no mass. There is no  tenderness. There is no rebound, no guarding and no CVA tenderness.  Genitourinary: No bleeding around the vagina. Vaginal discharge (white, thin, no obvious odor) found.  Skin: Skin is warm and dry.    Results for orders placed during the hospital encounter of 08/01/12 (from the past 24 hour(s))  URINALYSIS, ROUTINE W REFLEX MICROSCOPIC     Status: Abnormal   Collection Time    08/01/12  1:53 PM      Result Value Range   Color, Urine YELLOW  YELLOW   APPearance CLOUDY (*) CLEAR   Specific Gravity, Urine >1.030 (*) 1.005 - 1.030   pH 5.5  5.0 - 8.0   Glucose, UA NEGATIVE  NEGATIVE mg/dL   Hgb urine dipstick  NEGATIVE  NEGATIVE   Bilirubin Urine SMALL (*) NEGATIVE   Ketones, ur 15 (*) NEGATIVE mg/dL   Protein, ur 161 (*) NEGATIVE mg/dL   Urobilinogen, UA 0.2  0.0 - 1.0 mg/dL   Nitrite NEGATIVE  NEGATIVE   Leukocytes, UA NEGATIVE  NEGATIVE  URINE MICROSCOPIC-ADD ON     Status: Abnormal   Collection Time    08/01/12  1:53 PM      Result Value Range   Squamous Epithelial / LPF FEW (*) RARE   Urine-Other AMORPHOUS URATES/PHOSPHATES    HCG, SERUM, QUALITATIVE     Status: None   Collection Time    08/01/12  2:43 PM      Result Value Range   Preg, Serum NEGATIVE  NEGATIVE  WET PREP, GENITAL     Status: Abnormal   Collection Time    08/01/12  3:15 PM      Result Value Range   Yeast Wet Prep HPF POC NONE SEEN  NONE SEEN   Trich, Wet Prep NONE SEEN  NONE SEEN   Clue Cells Wet Prep HPF POC NONE SEEN  NONE SEEN   WBC, Wet Prep HPF POC FEW (*) NONE SEEN    MAU Course  Procedures She had STD cultures at [facility] this week  MDM  Discussed labs with the patient  Assessment and Plan  A:  Vaginal itching     Negative urine and serum test  P:  Rx for Terazol X 7 hs     Call Dr. Clearance Coots and follow up re: Neg pregnancy tests here   KEY,EVE M 08/01/2012, 4:01 PM

## 2012-08-01 NOTE — MAU Provider Note (Signed)
Attestation of Attending Supervision of Advanced Practitioner (CNM/NP): Evaluation and management procedures were performed by the Advanced Practitioner under my supervision and collaboration.  I have reviewed the Advanced Practitioner's note and chart, and I agree with the management and plan.  HARRAWAY-SMITH, CAROLYN 9:47 PM

## 2012-08-28 ENCOUNTER — Encounter: Payer: Self-pay | Admitting: Obstetrics & Gynecology

## 2013-01-26 ENCOUNTER — Other Ambulatory Visit: Payer: Self-pay | Admitting: Physician Assistant

## 2013-05-05 ENCOUNTER — Ambulatory Visit: Payer: Medicaid Other | Admitting: Advanced Practice Midwife

## 2013-05-21 NOTE — L&D Delivery Note (Signed)
Delivery Note At 2:49 PM a viable female was delivered via  (Presentation: LOA, compound presentation with posterior arm).     Placenta status: delivered with cord traction via Tomasa BlaseSchultz.    Anesthesia: Epidural  Episiotomy:  None Lacerations:  None Suture Repair: n/a Est. Blood Loss (mL):  100 ml  Mom to postpartum.  Baby to Couplet care / Skin to Skin.  JACKSON-MOORE,Faron Tudisco A 05/12/2014, 3:10 PM

## 2013-06-05 ENCOUNTER — Ambulatory Visit: Payer: Medicaid Other | Admitting: Advanced Practice Midwife

## 2013-06-06 ENCOUNTER — Encounter (HOSPITAL_COMMUNITY): Payer: Self-pay | Admitting: *Deleted

## 2013-09-05 ENCOUNTER — Encounter (HOSPITAL_BASED_OUTPATIENT_CLINIC_OR_DEPARTMENT_OTHER): Payer: Self-pay | Admitting: Emergency Medicine

## 2013-09-05 ENCOUNTER — Emergency Department (HOSPITAL_BASED_OUTPATIENT_CLINIC_OR_DEPARTMENT_OTHER)
Admission: EM | Admit: 2013-09-05 | Discharge: 2013-09-05 | Disposition: A | Discharge status: Home / Self Care | Payer: Medicaid Other | Attending: Emergency Medicine | Admitting: Emergency Medicine

## 2013-09-05 ENCOUNTER — Emergency Department (HOSPITAL_BASED_OUTPATIENT_CLINIC_OR_DEPARTMENT_OTHER): Payer: Medicaid Other

## 2013-09-05 DIAGNOSIS — O9989 Other specified diseases and conditions complicating pregnancy, childbirth and the puerperium: Secondary | ICD-10-CM | POA: Insufficient documentation

## 2013-09-05 DIAGNOSIS — Z8709 Personal history of other diseases of the respiratory system: Secondary | ICD-10-CM | POA: Insufficient documentation

## 2013-09-05 DIAGNOSIS — O169 Unspecified maternal hypertension, unspecified trimester: Secondary | ICD-10-CM | POA: Insufficient documentation

## 2013-09-05 DIAGNOSIS — M25519 Pain in unspecified shoulder: Secondary | ICD-10-CM

## 2013-09-05 DIAGNOSIS — IMO0001 Reserved for inherently not codable concepts without codable children: Secondary | ICD-10-CM | POA: Insufficient documentation

## 2013-09-05 DIAGNOSIS — O24919 Unspecified diabetes mellitus in pregnancy, unspecified trimester: Secondary | ICD-10-CM | POA: Insufficient documentation

## 2013-09-05 DIAGNOSIS — Z79899 Other long term (current) drug therapy: Secondary | ICD-10-CM | POA: Insufficient documentation

## 2013-09-05 DIAGNOSIS — E119 Type 2 diabetes mellitus without complications: Secondary | ICD-10-CM | POA: Insufficient documentation

## 2013-09-05 DIAGNOSIS — O99330 Smoking (tobacco) complicating pregnancy, unspecified trimester: Secondary | ICD-10-CM | POA: Insufficient documentation

## 2013-09-05 LAB — PREGNANCY, URINE: PREG TEST UR: POSITIVE — AB

## 2013-09-05 MED ORDER — PREDNISONE 10 MG PO TABS: ORAL_TABLET | ORAL | Status: DC

## 2013-09-05 MED ORDER — HYDROCODONE-ACETAMINOPHEN 5-325 MG PO TABS: 2.0000 | ORAL_TABLET | ORAL | Status: DC | PRN

## 2013-09-05 NOTE — Discharge Instructions (Signed)
Rotator Cuff Tendinitis  Rotator cuff tendinitis is inflammation of the tough, cord-like bands that connect muscle to bone (tendons) in your rotator cuff. Your rotator cuff is the collection of all the muscles and tendons that connect your arm to your shoulder. Your rotator cuff holds the head of your upper arm bone (humerus) in the cup (fossa) of your shoulder blade (scapula). CAUSES Rotator cuff tendinitis is usually caused by overusing the joint involved.  SIGNS AND SYMPTOMS  Deep ache in the shoulder also felt on the outside upper arm over the shoulder muscle.  Point tenderness over the area that is injured.  Pain comes on gradually and becomes worse with lifting the arm to the side (abduction) or turning it inward (internal rotation).  May lead to a chronic tear: When a rotator cuff tendon becomes inflamed, it runs the risk of losing its blood supply, causing some tendon fibers to die. This increases the risk that the tendon can fray and partially or completely tear. DIAGNOSIS Rotator cuff tendinitis is diagnosed by taking a medical history, performing a physical exam, and reviewing results of imaging exams. The medical history is useful to help determine the type of rotator cuff injury. The physical exam will include looking at the injured shoulder, feeling the injured area, and watching you do range-of-motion exercises. X-ray exams are typically done to rule out other causes of shoulder pain, such as fractures. MRI is the imaging exam usually used for significant shoulder injuries. Sometimes a dye study called CT arthrogram is done, but it is not as widely used as MRI. In some institutions, special ultrasound tests may also be used to aid in the diagnosis. TREATMENT  Less Severe Cases  Use of a sling to rest the shoulder for a short period of time. Prolonged use of the sling can cause stiffness, weakness, and loss of motion of the shoulder joint.  Anti-inflammatory medicines, such as  ibuprofen or naproxen sodium, may be prescribed. More Severe Cases  Physical therapy.  Use of steroid injections into the shoulder joint.  Surgery. HOME CARE INSTRUCTIONS   Use a sling or splint until the pain decreases. Prolonged use of the sling can cause stiffness, weakness, and loss of motion of the shoulder joint.  Apply ice to the injured area:  Put ice in a plastic bag.  Place a towel between your skin and the bag.  Leave the ice on for 20 minutes, 2 3 times a day.  Try to avoid use other than gentle range of motion while your shoulder is painful. Use the shoulder and exercise only as directed by your health care provider. Stop exercises or range of motion if pain or discomfort increases, unless directed otherwise by your health care provider.  Only take over-the-counter or prescription medicines for pain, discomfort, or fever as directed by your health care provider.  If you were given a shoulder sling and straps (immobilizer), do not remove it except as directed, or until you see a health care provider for a follow-up exam. If you need to remove it, move your arm as little as possible or as directed.  You may want to sleep on several pillows at night to lessen swelling and pain. SEEK IMMEDIATE MEDICAL CARE IF:   Your shoulder pain increases or new pain develops in your arm, hand, or fingers and is not relieved with medicines.  You have new, unexplained symptoms, especially increased numbness in the hands or loss of strength.  You develop any worsening of the   problems that brought you in for care.  Your arm, hand, or fingers are numb or tingling.  Your arm, hand, or fingers are swollen, painful, or turn white or blue. MAKE SURE YOU:  Understand these instructions.  Will watch your condition.  Will get help right away if you are not doing well or get worse. Document Released: 07/28/2003 Document Revised: 02/25/2013 Document Reviewed: 12/17/2012 [employer] Patient  Information 2014 [employer].  

## 2013-09-05 NOTE — ED Notes (Signed)
Patient did not xray due to pregnancy

## 2013-09-05 NOTE — ED Notes (Signed)
Patient here with ongoing intermittent right shoulder pain for months and was playing with kids and think she has aggravated same, pain with any movement

## 2013-09-05 NOTE — ED Provider Notes (Signed)
CSN: [id number]     Arrival date & time 09/05/13  1233 History   First MD Initiated Contact with Patient 09/05/13 1326     Chief Complaint  Patient presents with  . Shoulder Pain     (Consider location/radiation/quality/duration/timing/severity/associated sxs/prior Treatment) Patient is a 33 y.o. female presenting with shoulder pain. The history is provided by the patient. No language interpreter was used.  Shoulder Pain This is a new problem. The current episode started today. The problem occurs constantly. The problem has been gradually worsening. Associated symptoms include joint swelling and myalgias. Nothing aggravates the symptoms. She has tried nothing for the symptoms. The treatment provided moderate relief.    Past Medical History  Diagnosis Date  . Hypertension   . Diabetes mellitus   . PCOS (polycystic ovarian syndrome)   . Bronchitis    Past Surgical History  Procedure Laterality Date  . Carpal tunnel release    . Cholecystectomy    . Bladder repair     Family History  Problem Relation Age of Onset  . Hypertension Mother   . Diabetes Paternal Aunt   . Cancer Maternal Grandmother   . Stroke Maternal Grandfather   . Diabetes Paternal Grandmother   . Stroke Paternal Grandmother   . Cancer Father    History  Substance Use Topics  . Smoking status: Current Every Day Smoker -- 1.00 packs/day for 18 years    Types: Cigarettes  . Smokeless tobacco: Not on file  . Alcohol Use: No   OB History   Grav Para Term Preterm Abortions TAB SAB Ect Mult Living   3 2 2       2      Review of Systems  Musculoskeletal: Positive for joint swelling and myalgias.  All other systems reviewed and are negative.     Allergies  Review of patient's allergies indicates no known allergies.  Home Medications   Prior to Admission medications   Medication Sig Start Date End Date Taking? Authorizing Provider  losartan (COZAAR) 100 MG tablet Take 100 mg by mouth daily.   Yes  Historical Provider, MD  HYDROcodone-acetaminophen (NORCO/VICODIN) 5-325 MG per tablet Take 2 tablets by mouth every 4 (four) hours as needed. 09/05/13   Elson AreasLeslie K Kjerstin Abrigo, PA-C  predniSONE (DELTASONE) 10 MG tablet 6,5,4,3,2,1 taper 09/05/13   Elson AreasLeslie K Paiden Caraveo, PA-C   BP 131/78  Pulse 100  Temp(Src) 98.5 F (36.9 C)  Resp 16  SpO2 99% Physical Exam  Nursing note and vitals reviewed. Constitutional: She is oriented to person, place, and time. She appears well-developed and well-nourished.  HENT:  Head: Normocephalic and atraumatic.  Eyes: Conjunctivae and EOM are normal. Pupils are equal, round, and reactive to light.  Neck: Normal range of motion.  Cardiovascular: Normal rate.   Pulmonary/Chest: Effort normal.  Abdominal: She exhibits no distension.  Musculoskeletal: She exhibits tenderness.  Tender right shoulder  Decreased range of motion,  nv and ns intact  Neurological: She is alert and oriented to person, place, and time.  Skin: Skin is warm.  Psychiatric: She has a normal mood and affect.    ED Course  Procedures (including critical care time) Labs Review Labs Reviewed  PREGNANCY, URINE - Abnormal; Notable for the following:    Preg Test, Ur POSITIVE (*)    All other components within normal limits    Imaging Review No results found.   EKG Interpretation None      MDM   Final diagnoses:  Shoulder pain  Pt  is seeing Orthopaedist, had a shot of cortisone and was doing better,  Now increasing pain  Prednisone hydrocodone    Elson AreasLeslie K Kelsay Haggard, PA-C 09/05/13 1753

## 2013-09-06 NOTE — ED Provider Notes (Signed)
Medical screening examination/treatment/procedure(s) were performed by non-physician practitioner and as supervising physician I was immediately available for consultation/collaboration.    Celene KrasJon R Keylen Uzelac, MD 09/06/13 [phone]

## 2013-09-09 ENCOUNTER — Inpatient Hospital Stay (HOSPITAL_COMMUNITY)
Admission: AD | Admit: 2013-09-09 | Discharge: 2013-09-09 | Disposition: A | Discharge status: Home / Self Care | Payer: Medicaid Other | Source: Ambulatory Visit | Attending: Obstetrics & Gynecology | Admitting: Obstetrics & Gynecology

## 2013-09-09 ENCOUNTER — Inpatient Hospital Stay (HOSPITAL_COMMUNITY): Payer: Medicaid Other

## 2013-09-09 ENCOUNTER — Encounter (HOSPITAL_COMMUNITY): Payer: Self-pay

## 2013-09-09 DIAGNOSIS — O9989 Other specified diseases and conditions complicating pregnancy, childbirth and the puerperium: Secondary | ICD-10-CM

## 2013-09-09 DIAGNOSIS — O99330 Smoking (tobacco) complicating pregnancy, unspecified trimester: Secondary | ICD-10-CM | POA: Insufficient documentation

## 2013-09-09 DIAGNOSIS — O26899 Other specified pregnancy related conditions, unspecified trimester: Secondary | ICD-10-CM

## 2013-09-09 DIAGNOSIS — O10019 Pre-existing essential hypertension complicating pregnancy, unspecified trimester: Secondary | ICD-10-CM | POA: Insufficient documentation

## 2013-09-09 DIAGNOSIS — O99891 Other specified diseases and conditions complicating pregnancy: Secondary | ICD-10-CM | POA: Insufficient documentation

## 2013-09-09 DIAGNOSIS — K5900 Constipation, unspecified: Secondary | ICD-10-CM | POA: Insufficient documentation

## 2013-09-09 DIAGNOSIS — O24919 Unspecified diabetes mellitus in pregnancy, unspecified trimester: Secondary | ICD-10-CM | POA: Insufficient documentation

## 2013-09-09 DIAGNOSIS — E119 Type 2 diabetes mellitus without complications: Secondary | ICD-10-CM | POA: Insufficient documentation

## 2013-09-09 DIAGNOSIS — R109 Unspecified abdominal pain: Secondary | ICD-10-CM | POA: Insufficient documentation

## 2013-09-09 DIAGNOSIS — N898 Other specified noninflammatory disorders of vagina: Secondary | ICD-10-CM | POA: Insufficient documentation

## 2013-09-09 LAB — CBC
HEMATOCRIT: 45 % (ref 36.0–46.0)
HEMOGLOBIN: 16.3 g/dL — AB (ref 12.0–15.0)
MCH: 32 pg (ref 26.0–34.0)
MCHC: 36.2 g/dL — ABNORMAL HIGH (ref 30.0–36.0)
MCV: 88.4 fL (ref 78.0–100.0)
Platelets: 295 10*3/uL (ref 150–400)
RBC: 5.09 MIL/uL (ref 3.87–5.11)
RDW: 12.4 % (ref 11.5–15.5)
WBC: 4.3 10*3/uL (ref 4.0–10.5)

## 2013-09-09 LAB — URINALYSIS, ROUTINE W REFLEX MICROSCOPIC
Bilirubin Urine: NEGATIVE
Glucose, UA: NEGATIVE mg/dL
KETONES UR: NEGATIVE mg/dL
LEUKOCYTES UA: NEGATIVE
NITRITE: NEGATIVE
PH: 6 (ref 5.0–8.0)
Protein, ur: NEGATIVE mg/dL
SPECIFIC GRAVITY, URINE: 1.025 (ref 1.005–1.030)
UROBILINOGEN UA: 1 mg/dL (ref 0.0–1.0)

## 2013-09-09 LAB — WET PREP, GENITAL
CLUE CELLS WET PREP: NONE SEEN
Trich, Wet Prep: NONE SEEN
Yeast Wet Prep HPF POC: NONE SEEN

## 2013-09-09 LAB — URINE MICROSCOPIC-ADD ON

## 2013-09-09 LAB — HCG, QUANTITATIVE, PREGNANCY: HCG, BETA CHAIN, QUANT, S: 1182 m[IU]/mL — AB (ref <5)

## 2013-09-09 LAB — ABO/RH: ABO/RH(D): A POS

## 2013-09-09 NOTE — MAU Provider Note (Signed)
History     CSN: [id number]  Arrival date and time: 09/09/13 1251   First Provider Initiated Contact with Patient 09/09/13 1440      Chief Complaint  Patient presents with  . Abdominal Cramping   HPI [name] is a 33 y.o. [phone] at 4642w2d who presents to MAU today with complaint of lower abdominal cramping. The patient has an appointment to start care with [facility] on 10/12/13. She states white, odorous vaginal discharge recently. She has had nausea without vomiting or diarrhea. She does have constipation. Last BM was 2 days ago. She denies vaginal bleeding, fever, UTI symptoms. She has a history of HTN and DM II. She states that she was taken off of Metformin recently by PCP. She has not taken anti-HTN meds recently, but BP has been normal.   OB History   Grav Para Term Preterm Abortions TAB SAB Ect Mult Living   4 2 2  1  1   2       Past Medical History  Diagnosis Date  . Hypertension   . Diabetes mellitus   . PCOS (polycystic ovarian syndrome)   . Bronchitis     Past Surgical History  Procedure Laterality Date  . Carpal tunnel release    . Cholecystectomy    . Bladder repair      Family History  Problem Relation Age of Onset  . Hypertension Mother   . Diabetes Paternal Aunt   . Cancer Maternal Grandmother   . Stroke Maternal Grandfather   . Diabetes Paternal Grandmother   . Stroke Paternal Grandmother   . Cancer Father     History  Substance Use Topics  . Smoking status: Current Every Day Smoker -- 1.00 packs/day for 18 years    Types: Cigarettes  . Smokeless tobacco: Not on file  . Alcohol Use: No    Allergies: No Known Allergies  No prescriptions prior to admission    Review of Systems  Constitutional: Positive for malaise/fatigue. Negative for fever.  Gastrointestinal: Positive for nausea, abdominal pain and constipation. Negative for vomiting and diarrhea.  Genitourinary: Negative for dysuria, urgency and frequency.       Neg - vaginal  bleeding + vaginal discharge  Neurological: Positive for dizziness. Negative for loss of consciousness and weakness.   Physical Exam   Blood pressure 135/86, pulse 89, temperature 98.7 F (37.1 C), temperature source Oral, resp. rate 18, height 5' 0.5" (1.537 m), weight 191 lb 12.8 oz (87 kg), last menstrual period 08/03/2013, SpO2 99.00%, unknown if currently breastfeeding.  Physical Exam  Constitutional: She appears well-developed and well-nourished. No distress.  HENT:  Head: Normocephalic and atraumatic.  Cardiovascular: Normal rate.   Respiratory: Effort normal.  GI: Soft. She exhibits no distension and no mass. There is tenderness (mild lower abdominal tenderness to palpation). There is no rebound and no guarding.  Genitourinary: Uterus is not enlarged (exam limited by maternal body habitus) and not tender. Cervix exhibits friability. Cervix exhibits no motion tenderness and no discharge. Right adnexum displays no mass and no tenderness. Left adnexum displays no mass and no tenderness. No bleeding around the vagina. Vaginal discharge (scant thin white mucus discharge noted) found.  Neurological: She is alert.  Skin: Skin is warm and dry. No erythema.  Psychiatric: She has a normal mood and affect.   Results for orders placed during the hospital encounter of 09/09/13 (from the past 24 hour(s))  URINALYSIS, ROUTINE W REFLEX MICROSCOPIC     Status: Abnormal  Collection Time    09/09/13  1:20 PM      Result Value Ref Range   Color, Urine YELLOW  YELLOW   APPearance CLEAR  CLEAR   Specific Gravity, Urine 1.025  1.005 - 1.030   pH 6.0  5.0 - 8.0   Glucose, UA NEGATIVE  NEGATIVE mg/dL   Hgb urine dipstick SMALL (*) NEGATIVE   Bilirubin Urine NEGATIVE  NEGATIVE   Ketones, ur NEGATIVE  NEGATIVE mg/dL   Protein, ur NEGATIVE  NEGATIVE mg/dL   Urobilinogen, UA 1.0  0.0 - 1.0 mg/dL   Nitrite NEGATIVE  NEGATIVE   Leukocytes, UA NEGATIVE  NEGATIVE  URINE MICROSCOPIC-ADD ON     Status:  Abnormal   Collection Time    09/09/13  1:20 PM      Result Value Ref Range   Squamous Epithelial / LPF FEW (*) RARE   WBC, UA 0-2  <3 WBC/hpf   RBC / HPF 0-2  <3 RBC/hpf  HCG, QUANTITATIVE, PREGNANCY     Status: Abnormal   Collection Time    09/09/13  1:28 PM      Result Value Ref Range   hCG, Beta Chain, Quant, S 1182 (*) <5 mIU/mL  CBC     Status: Abnormal   Collection Time    09/09/13  1:29 PM      Result Value Ref Range   WBC 4.3  4.0 - 10.5 K/uL   RBC 5.09  3.87 - 5.11 MIL/uL   Hemoglobin 16.3 (*) 12.0 - 15.0 g/dL   HCT 16.1  09.6 - 04.5 %   MCV 88.4  78.0 - 100.0 fL   MCH 32.0  26.0 - 34.0 pg   MCHC 36.2 (*) 30.0 - 36.0 g/dL   RDW 40.9  81.1 - 91.4 %   Platelets 295  150 - 400 K/uL  ABO/RH     Status: None   Collection Time    09/09/13  1:29 PM      Result Value Ref Range   ABO/RH(D) A POS    WET PREP, GENITAL     Status: Abnormal   Collection Time    09/09/13  2:50 PM      Result Value Ref Range   Yeast Wet Prep HPF POC NONE SEEN  NONE SEEN   Trich, Wet Prep NONE SEEN  NONE SEEN   Clue Cells Wet Prep HPF POC NONE SEEN  NONE SEEN   WBC, Wet Prep HPF POC FEW (*) NONE SEEN   US Ob Comp Less 14 Wks  09/09/2013   CLINICAL DATA:  Early pregnancy, cramping, lower abdominal pain, history of polycystic ovarian syndrome, spontaneous abortion ; quantitative beta HCG = 1,182 ; 5 week 2 day EGA by LMP  EXAM: OBSTETRIC <14 WK [place] AND TRANSVAGINAL OB US  TECHNIQUE: Both transabdominal and transvaginal ultrasound examinations were performed for complete evaluation of the gestation as well as the maternal uterus, adnexal regions, and pelvic cul-de-sac. Transvaginal technique was performed to assess early pregnancy.  COMPARISON:  None for this gestation  FINDINGS: Intrauterine gestational sac: Visualized/normal in shape.  Yolk sac:  Not identified  Embryo:  Not identified  Cardiac Activity: N/A  Heart Rate:  N/A bpm  MSD:  4.4  mm   5 w   0  d             [place] EDC:  Maternal  uterus/adnexae:  Small subchronic hemorrhage.  Small nonspecific echogenic focus likely calcification at the inferior uterine  segment near cervix.  Right ovary normal size and morphology 3.6 x 2.4 x 3.1 cm, containing internal blood flow on color Doppler imaging.  Left ovary normal size and morphology, 2.5 x 1.6 x 2.0 cm containing internal blood flow on color Doppler imaging.  No free pelvic fluid or adnexal masses.  IMPRESSION: Small probable gestational sac within uterus.  Unable to establish fetal viability due to nonvisualization of a fetal pole.  Consider followup ultrasound in 14 days to reassess for fetal viability if clinically indicated.  Small subchorionic hemorrhage.   Electronically Signed   By: Ulyses SouthwardMark  Boles M.D.   On: 09/09/2013 16:25   [place] Ob Transvaginal  09/09/2013   CLINICAL DATA:  Early pregnancy, cramping, lower abdominal pain, history of polycystic ovarian syndrome, spontaneous abortion ; quantitative beta HCG = 1,182 ; 5 week 2 day EGA by LMP  EXAM: OBSTETRIC <14 WK US AND TRANSVAGINAL OB US  TECHNIQUE: Both transabdominal and transvaginal ultrasound examinations were performed for complete evaluation of the gestation as well as the maternal uterus, adnexal regions, and pelvic cul-de-sac. Transvaginal technique was performed to assess early pregnancy.  COMPARISON:  None for this gestation  FINDINGS: Intrauterine gestational sac: Visualized/normal in shape.  Yolk sac:  Not identified  Embryo:  Not identified  Cardiac Activity: N/A  Heart Rate:  N/A bpm  MSD:  4.4  mm   5 w   0  d             US EDC:  Maternal uterus/adnexae:  Small subchronic hemorrhage.  Small nonspecific echogenic focus likely calcification at the inferior uterine segment near cervix.  Right ovary normal size and morphology 3.6 x 2.4 x 3.1 cm, containing internal blood flow on color Doppler imaging.  Left ovary normal size and morphology, 2.5 x 1.6 x 2.0 cm containing internal blood flow on color Doppler imaging.  No free  pelvic fluid or adnexal masses.  IMPRESSION: Small probable gestational sac within uterus.  Unable to establish fetal viability due to nonvisualization of a fetal pole.  Consider followup ultrasound in 14 days to reassess for fetal viability if clinically indicated.  Small subchorionic hemorrhage.   Electronically Signed   By: Ulyses SouthwardMark  Boles M.D.   On: 09/09/2013 16:25     MAU Course  Procedures None  MDM +UPT  UA, wet prep, GC/Chlamydia, CBC, ABO/Rh, quant hCG and US today  Assessment and Plan  A: IUGS at 2044w0d without YS or FP Abdominal pain in pregnancy  P: Discharge home Bleeding/ectopic precautions discussed Patient to return to MAU for follow-up in 48 hours for repeat quant hCG Patient may return to MAU as needed sooner or if her condition were to change or worsen  Freddi StarrJulie N Ethier, PA-C  09/09/2013, 9:05 PM

## 2013-09-09 NOTE — MAU Note (Signed)
Patient states she is having abdominal cramping. Denies bleeding, discharge, nausea or vomiting.

## 2013-09-09 NOTE — Discharge Instructions (Signed)
Subchorionic Hematoma °A subchorionic hematoma is a gathering of blood between the outer wall of the placenta and the inner wall of the womb (uterus). The placenta is the organ that connects the fetus to the wall of the uterus. The placenta performs the feeding, breathing (oxygen to the fetus), and waste removal (excretory work) of the fetus.  °Subchorionic hematoma is the most common abnormality found on a result from ultrasonography done during the first trimester or early second trimester of pregnancy. If there has been little or no vaginal bleeding, early small hematomas usually shrink on their own and do not affect your baby or pregnancy. The blood is gradually absorbed over 1 2 weeks. When bleeding starts later in pregnancy or the hematoma is larger or occurs in an older pregnant woman, the outcome may not be as good. Larger hematomas may get bigger, which increases the chances for miscarriage. Subchorionic hematoma also increases the risk of premature detachment of the placenta from the uterus, preterm (premature) labor, and stillbirth. °HOME CARE INSTRUCTIONS  °· Stay on bed rest if your health care provider recommends this. Although bed rest will not prevent more bleeding or prevent a miscarriage, your health care provider may recommend bed rest until you are advised otherwise. °· Avoid heavy lifting (more than 10 lb [4.5 kg]), exercise, sexual intercourse, or douching as directed by your health care provider. °· Keep track of the number of pads you use each day and how soaked (saturated) they are. Write down this information. °· Do not use tampons. °· Keep all follow-up appointments as directed by your health care provider. Your health care provider may ask you to have follow-up blood tests or ultrasound tests or both. °SEEK IMMEDIATE MEDICAL CARE IF:  °· You have severe cramps in your stomach, back, abdomen, or pelvis. °· You have a fever. °· You pass large clots or tissue. Save any tissue for your health  care provider to look at. °· Your bleeding increases or you become lightheaded, feel weak, or have fainting episodes. °Document Released: 08/22/2006 Document Revised: 02/25/2013 Document Reviewed: 12/04/2012 °ExitCare® Patient Information ©2014 [employer]. ° °

## 2013-09-10 LAB — GC/CHLAMYDIA PROBE AMP
CT Probe RNA: NEGATIVE
GC PROBE AMP APTIMA: NEGATIVE

## 2013-09-11 ENCOUNTER — Inpatient Hospital Stay (HOSPITAL_COMMUNITY)
Admission: AD | Admit: 2013-09-11 | Discharge: 2013-09-11 | Disposition: A | Discharge status: Home / Self Care | Payer: Medicaid Other | Source: Ambulatory Visit | Attending: Obstetrics | Admitting: Obstetrics

## 2013-09-11 ENCOUNTER — Encounter (HOSPITAL_COMMUNITY): Payer: Self-pay | Admitting: *Deleted

## 2013-09-11 DIAGNOSIS — O24919 Unspecified diabetes mellitus in pregnancy, unspecified trimester: Secondary | ICD-10-CM | POA: Insufficient documentation

## 2013-09-11 DIAGNOSIS — E119 Type 2 diabetes mellitus without complications: Secondary | ICD-10-CM | POA: Insufficient documentation

## 2013-09-11 DIAGNOSIS — O200 Threatened abortion: Secondary | ICD-10-CM

## 2013-09-11 DIAGNOSIS — O10019 Pre-existing essential hypertension complicating pregnancy, unspecified trimester: Secondary | ICD-10-CM | POA: Insufficient documentation

## 2013-09-11 LAB — HCG, QUANTITATIVE, PREGNANCY: HCG, BETA CHAIN, QUANT, S: 2183 m[IU]/mL — AB (ref <5)

## 2013-09-11 NOTE — MAU Provider Note (Signed)
  History     CSN: [id number]  Arrival date and time: 09/11/13 1655   None     Chief Complaint  Patient presents with  . Follow-up   HPI  Michelle Houston is a 33 y.o. [id number] at 6548w4d who presents today for FU HCG. She denies any bleeding or pain at this time. She states that she has an appointment with [facility] at the end of May.   Past Medical History  Diagnosis Date  . Hypertension   . Diabetes mellitus   . PCOS (polycystic ovarian syndrome)   . Bronchitis     Past Surgical History  Procedure Laterality Date  . Carpal tunnel release    . Cholecystectomy    . Bladder repair      Family History  Problem Relation Age of Onset  . Hypertension Mother   . Diabetes Paternal Aunt   . Cancer Maternal Grandmother   . Stroke Maternal Grandfather   . Diabetes Paternal Grandmother   . Stroke Paternal Grandmother   . Cancer Father     History  Substance Use Topics  . Smoking status: Current Every Day Smoker -- 1.00 packs/day for 18 years    Types: Cigarettes  . Smokeless tobacco: Not on file  . Alcohol Use: No    Allergies: No Known Allergies  Prescriptions prior to admission  Medication Sig Dispense Refill  . acetaminophen (TYLENOL) 500 MG tablet Take 1,000 mg by mouth every 6 (six) hours as needed for headache.      Marland Kitchen. HYDROcodone-acetaminophen (NORCO/VICODIN) 5-325 MG per tablet Take 2 tablets by mouth every 4 (four) hours as needed.  20 tablet  0  . losartan (COZAAR) 100 MG tablet Take 100 mg by mouth daily.        ROS Physical Exam   Blood pressure 131/78, pulse 93, temperature 99.2 F (37.3 C), temperature source Oral, resp. rate 18, last menstrual period 08/03/2013, unknown if currently breastfeeding.  Physical Exam  Nursing note and vitals reviewed. Constitutional: She is oriented to person, place, and time. She appears well-developed and well-nourished. No distress.  Cardiovascular: Normal rate.   GI: Soft. There is no tenderness.  Neurological: She  is alert and oriented to person, place, and time.  Skin: Skin is warm and dry.  Psychiatric: She has a normal mood and affect.    MAU Course  Procedures  Results for Wilder GladeJOHNSON, [name] (MRN [id number]) as of 09/11/2013 17:41  Ref. Range 09/09/2013 13:28 09/09/2013 13:29 09/09/2013 14:50 09/09/2013 16:16 09/11/2013 17:04  hCG, Beta Chain, Quant, S Latest Range: <5 mIU/mL 1182 (H)    2183 (H)    Assessment and Plan   1. Threatened abortion    HCG rising appropriately Repeat US in 2 weeks FU with the office as planned Return precautions reviewed Return to MAU as needed  Follow-up Information   Schedule an appointment as soon as possible [facility]   Specialty:  Obstetrics and Gynecology   Contact information:   [address], [address] [place] [address] [phone]       Tawnya CrookHeather Donovan Winter Jocelyn 09/11/2013, 5:42 PM

## 2013-09-11 NOTE — Discharge Instructions (Signed)
Pregnancy - First Trimester  During sexual intercourse, millions of sperm go into the vagina. Only 1 sperm will penetrate and fertilize the female egg while it is in the Fallopian tube. One week later, the fertilized egg implants into the wall of the uterus. An embryo begins to develop into a baby. At 6 to 8 weeks, the eyes and face are formed and the heartbeat can be seen on ultrasound. At the end of 12 weeks (first trimester), all the baby's organs are formed. Now that you are pregnant, you will want to do everything you can to have a healthy baby. Two of the most important things are to get good prenatal care and follow your caregiver's instructions. Prenatal care is all the medical care you receive before the baby's birth. It is given to prevent, find, and treat problems during the pregnancy and childbirth.  PRENATAL EXAMS  · During prenatal visits, your weight, blood pressure, and urine are checked. This is done to make sure you are healthy and progressing normally during the pregnancy.  · A pregnant woman should gain 25 to 35 pounds during the pregnancy. However, if you are overweight or underweight, your caregiver will advise you regarding your weight.  · Your caregiver will ask and answer questions for you.  · Blood work, cervical cultures, other necessary tests, and a Pap test are done during your prenatal exams. These tests are done to check on your health and the probable health of your baby. Tests are strongly recommended and done for HIV with your permission. This is the virus that causes AIDS. These tests are done because medicines can be given to help prevent your baby from being born with this infection should you have been infected without knowing it. Blood work is also used to find out your blood type, previous infections, and follow your blood levels (hemoglobin).  · Low hemoglobin (anemia) is common during pregnancy. Iron and vitamins are given to help prevent this. Later in the pregnancy, blood  tests for diabetes will be done along with any other tests if any problems develop.  · You may need other tests to make sure you and the baby are doing well.  CHANGES DURING THE FIRST TRIMESTER   Your body goes through many changes during pregnancy. They vary from person to person. Talk to your caregiver about changes you notice and are concerned about. Changes can include:  · Your menstrual period stops.  · The egg and sperm carry the genes that determine what you look like. Genes from you and your partner are forming a baby. The female genes determine whether the baby is a boy or a girl.  · Your body increases in girth and you may feel bloated.  · Feeling sick to your stomach (nauseous) and throwing up (vomiting). If the vomiting is uncontrollable, call your caregiver.  · Your breasts will begin to enlarge and become tender.  · Your nipples may stick out more and become darker.  · The need to urinate more. Painful urination may mean you have a bladder infection.  · Tiring easily.  · Loss of appetite.  · Cravings for certain kinds of food.  · At first, you may gain or lose a couple of pounds.  · You may have changes in your emotions from day to day (excited to be pregnant or concerned something may go wrong with the pregnancy and baby).  · You may have more vivid and strange dreams.  HOME CARE INSTRUCTIONS   ·   It is very important to avoid all smoking, alcohol and non-prescribed drugs during your pregnancy. These affect the formation and growth of the baby. Avoid chemicals while pregnant to ensure the delivery of a healthy infant.  · Start your prenatal visits by the 12th week of pregnancy. They are usually scheduled monthly at first, then more often in the last 2 months before delivery. Keep your caregiver's appointments. Follow your caregiver's instructions regarding medicine use, blood and lab tests, exercise, and diet.  · During pregnancy, you are providing food for you and your baby. Eat regular, well-balanced  meals. Choose foods such as meat, fish, milk and other low fat dairy products, vegetables, fruits, and whole-grain breads and cereals. Your caregiver will tell you of the ideal weight gain.  · You can help morning sickness by keeping soda crackers at the bedside. Eat a couple before arising in the morning. You may want to use the crackers without salt on them.  · Eating 4 to 5 small meals rather than 3 large meals a day also may help the nausea and vomiting.  · Drinking liquids between meals instead of during meals also seems to help nausea and vomiting.  · A physical sexual relationship may be continued throughout pregnancy if there are no other problems. Problems may be early (premature) leaking of amniotic fluid from the membranes, vaginal bleeding, or belly (abdominal) pain.  · Exercise regularly if there are no restrictions. Check with your caregiver or physical therapist if you are unsure of the safety of some of your exercises. Greater weight gain will occur in the last 2 trimesters of pregnancy. Exercising will help:  · Control your weight.  · Keep you in shape.  · Prepare you for labor and delivery.  · Help you lose your pregnancy weight after you deliver your baby.  · Wear a good support or jogging bra for breast tenderness during pregnancy. This may help if worn during sleep too.  · Ask when prenatal classes are available. Begin classes when they are offered.  · Do not use hot tubs, steam rooms, or saunas.  · Wear your seat belt when driving. This protects you and your baby if you are in an accident.  · Avoid raw meat, uncooked cheese, cat litter boxes, and soil used by cats throughout the pregnancy. These carry germs that can cause birth defects in the baby.  · The first trimester is a good time to visit your dentist for your dental health. Getting your teeth cleaned is okay. Use a softer toothbrush and brush gently during pregnancy.  · Ask for help if you have financial, counseling, or nutritional needs  during pregnancy. Your caregiver will be able to offer counseling for these needs as well as refer you for other special needs.  · Do not take any medicines or herbs unless told by your caregiver.  · Inform your caregiver if there is any mental or physical domestic violence.  · Make a list of emergency phone numbers of family, friends, hospital, and police and fire departments.  · Write down your questions. Take them to your prenatal visit.  · Do not douche.  · Do not cross your legs.  · If you have to stand for long periods of time, rotate you feet or take small steps in a circle.  · You may have more vaginal secretions that may require a sanitary pad. Do not use tampons or scented sanitary pads.  MEDICINES AND DRUG USE IN PREGNANCY  ·   Take prenatal vitamins as directed. The vitamin should contain 1 milligram of folic acid. Keep all vitamins out of reach of children. Only a couple vitamins or tablets containing iron may be fatal to a baby or young child when ingested.  · Avoid use of all medicines, including herbs, over-the-counter medicines, not prescribed or suggested by your caregiver. Only take over-the-counter or prescription medicines for pain, discomfort, or fever as directed by your caregiver. Do not use aspirin, ibuprofen, or naproxen unless directed by your caregiver.  · Let your caregiver also know about herbs you may be using.  · Alcohol is related to a number of birth defects. This includes fetal alcohol syndrome. All alcohol, in any form, should be avoided completely. Smoking will cause low birth rate and premature babies.  · Street or illegal drugs are very harmful to the baby. They are absolutely forbidden. A baby born to an addicted mother will be addicted at birth. The baby will go through the same withdrawal an adult does.  · Let your caregiver know about any medicines that you have to take and for what reason you take them.  SEEK MEDICAL CARE IF:   You have any concerns or worries during your  pregnancy. It is better to call with your questions if you feel they cannot wait, rather than worry about them.  SEEK IMMEDIATE MEDICAL CARE IF:   · An unexplained oral temperature above 102° F (38.9° C) develops, or as your caregiver suggests.  · You have leaking of fluid from the vagina (birth canal). If leaking membranes are suspected, take your temperature and inform your caregiver of this when you call.  · There is vaginal spotting or bleeding. Notify your caregiver of the amount and how many pads are used.  · You develop a bad smelling vaginal discharge with a change in the color.  · You continue to feel sick to your stomach (nauseated) and have no relief from remedies suggested. You vomit blood or coffee ground-like materials.  · You lose more than 2 pounds of weight in 1 week.  · You gain more than 2 pounds of weight in 1 week and you notice swelling of your face, hands, feet, or legs.  · You gain 5 pounds or more in 1 week (even if you do not have swelling of your hands, face, legs, or feet).  · You get exposed to German measles and have never had them.  · You are exposed to fifth disease or chickenpox.  · You develop belly (abdominal) pain. Round ligament discomfort is a common non-cancerous (benign) cause of abdominal pain in pregnancy. Your caregiver still must evaluate this.  · You develop headache, fever, diarrhea, pain with urination, or shortness of breath.  · You fall or are in a car accident or have any kind of trauma.  · There is mental or physical violence in your home.  Document Released: 05/01/2001 Document Revised: 01/30/2012 Document Reviewed: 11/02/2008  ExitCare® Patient Information ©2014 [employer].

## 2013-09-11 NOTE — MAU Note (Signed)
Feeling ok, no pain or bleeding today.

## 2013-09-23 ENCOUNTER — Ambulatory Visit (HOSPITAL_COMMUNITY): Payer: Medicaid Other

## 2013-09-23 ENCOUNTER — Ambulatory Visit (HOSPITAL_COMMUNITY)
Admission: RE | Admit: 2013-09-23 | Discharge: 2013-09-23 | Disposition: A | Discharge status: Home / Self Care | Payer: Medicaid Other | Source: Ambulatory Visit | Attending: Advanced Practice Midwife | Admitting: Advanced Practice Midwife

## 2013-09-23 DIAGNOSIS — Z3689 Encounter for other specified antenatal screening: Secondary | ICD-10-CM | POA: Insufficient documentation

## 2013-09-23 DIAGNOSIS — O200 Threatened abortion: Secondary | ICD-10-CM

## 2013-10-13 ENCOUNTER — Ambulatory Visit (INDEPENDENT_AMBULATORY_CARE_PROVIDER_SITE_OTHER): Payer: Medicaid Other | Admitting: Advanced Practice Midwife

## 2013-10-13 ENCOUNTER — Encounter: Payer: Self-pay | Admitting: Advanced Practice Midwife

## 2013-10-13 ENCOUNTER — Ambulatory Visit (INDEPENDENT_AMBULATORY_CARE_PROVIDER_SITE_OTHER): Payer: Medicaid Other

## 2013-10-13 VITALS — BP 137/89 | HR 90 | Temp 98.6°F | Ht 61.5 in | Wt 196.0 lb

## 2013-10-13 DIAGNOSIS — O3680X0 Pregnancy with inconclusive fetal viability, not applicable or unspecified: Secondary | ICD-10-CM

## 2013-10-13 DIAGNOSIS — O0990 Supervision of high risk pregnancy, unspecified, unspecified trimester: Secondary | ICD-10-CM | POA: Insufficient documentation

## 2013-10-13 DIAGNOSIS — Z3480 Encounter for supervision of other normal pregnancy, unspecified trimester: Secondary | ICD-10-CM

## 2013-10-13 DIAGNOSIS — I10 Essential (primary) hypertension: Secondary | ICD-10-CM

## 2013-10-13 LAB — POCT URINALYSIS DIPSTICK
BILIRUBIN UA: NEGATIVE
Glucose, UA: NEGATIVE
Ketones, UA: NEGATIVE
LEUKOCYTES UA: NEGATIVE
NITRITE UA: NEGATIVE
PH UA: 6
PROTEIN UA: NEGATIVE
Spec Grav, UA: 1.015
UROBILINOGEN UA: NEGATIVE

## 2013-10-13 LAB — US OB COMP LESS 14 WKS

## 2013-10-13 MED ORDER — OB COMPLETE PETITE 35-5-1-200 MG PO CAPS: 1.0000 | ORAL_CAPSULE | Freq: Every day | ORAL | Status: DC

## 2013-10-13 NOTE — Progress Notes (Signed)
Subjective:    Michelle Houston is a [id number] [redacted]w[redacted]d being seen today for her first obstetrical visit.  Her obstetrical history is significant for hypertension. Patient does intend to breast feed. Pregnancy history fully reviewed.  Patient here w/ her partner Michelle Houston. He is the father of their daughters. They are excited for this pregnancy and have been trying for Approx. 6 months. She denies current drug use. 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   Patient reports no complaints.  Filed Vitals:   10/13/13 0921 10/13/13 0925  BP: 137/89   Pulse: 90   Temp: 98.6 F (37 C)   Height:  5' 1.5" (1.562 m)  Weight: 196 lb (88.905 kg)     HISTORY: OB History  Gravida Para Term Preterm AB SAB TAB Ectopic Multiple Living  4 2 2  1 1    2     # Outcome Date GA Lbr Len/2nd Weight Sex Delivery Anes PTL Lv  4 CUR           3 SAB 07/2012        N  2 TRM 07/1999 [redacted]w[redacted]d  7 lb 3 oz (3.26 kg) F SVD None  Y  1 TRM 02/1996 [redacted]w[redacted]d  6 lb 15 oz (3.147 kg) F SVD None  Y     Past Medical History  Diagnosis Date  . Hypertension   . Diabetes mellitus   . PCOS (polycystic ovarian syndrome)   . Bronchitis    Past Surgical History  Procedure Laterality Date  . Carpal tunnel release    . Cholecystectomy    . Bladder repair     Family History  Problem Relation Age of Onset  . Hypertension Mother   . Diabetes Paternal Aunt   . Cancer Maternal Grandmother   . Stroke Maternal Grandfather   . Diabetes Paternal Grandmother   . Stroke Paternal Grandmother   . Cancer Father      Exam    Filed Vitals:   10/13/13 0921  BP: 137/89  Pulse: 90  Temp: 98.6 F (37 C)   Filed Vitals:   10/13/13 0925  Height: 5' 1.5" (1.562 m)  Weight:    FHR 154 by Ultrasound  Plan PE NV  Physical Examination: General appearance - alert, well appearing, and in no distress Mental status - alert, oriented to person, place, and time Abdomen - soft, nontender, nondistended, no masses  or organomegaly  Unable to auscultate for visualize FHR on bedside US, formal US requested and done today. FHR 154, + fetal movement s=d.    Assessment:    Pregnancy: [id number] Patient Active Problem List   Diagnosis Date Noted  . Supervision of other normal pregnancy 10/13/2013  . Chronic hypertension 10/13/2013        Plan:     Initial labs drawn. Prenatal vitamins. Problem list reviewed and updated. Genetic Screening discussed Quad Screen: plan NV.  Ultrasound discussed; fetal survey: requested. Done today to confirm viability  Follow up in 4 weeks. Orders Placed This Encounter  Procedures  . Culture, OB Urine  . US OB Comp Less 14 Wks    Standing Status: Future     Number of Occurrences: 1     Standing Expiration Date: 12/14/2014    Order Specific Question:  Reason for Exam (SYMPTOM  OR DIAGNOSIS REQUIRED)    Answer:  Viability    Order Specific Question:  Preferred imaging location?    Answer:  Internal  . Obstetric  panel  . HIV antibody  . Hemoglobinopathy evaluation  . Varicella zoster antibody, IgG  . Vit D  25 hydroxy (rtn osteoporosis monitoring)  . TSH  . AMB Referral to Maternal Fetal Medicine (MFM)    Referral Priority:  Routine    Referral Type:  Consultation    Number of Visits Requested:  1  . POCT urinalysis dipstick   [facility] b/t 18-20 weeks   Wenceslao Loper Dessa PhiHowell Adria Costley 10/13/2013

## 2013-10-14 LAB — OBSTETRIC PANEL
ANTIBODY SCREEN: NEGATIVE
BASOS ABS: 0 10*3/uL (ref 0.0–0.1)
BASOS PCT: 1 % (ref 0–1)
EOS ABS: 0 10*3/uL (ref 0.0–0.7)
Eosinophils Relative: 1 % (ref 0–5)
HCT: 40.6 % (ref 36.0–46.0)
HEMOGLOBIN: 14.1 g/dL (ref 12.0–15.0)
HEP B S AG: NEGATIVE
Lymphocytes Relative: 46 % (ref 12–46)
Lymphs Abs: 1.7 10*3/uL (ref 0.7–4.0)
MCH: 30.2 pg (ref 26.0–34.0)
MCHC: 34.7 g/dL (ref 30.0–36.0)
MCV: 86.9 fL (ref 78.0–100.0)
MONOS PCT: 7 % (ref 3–12)
Monocytes Absolute: 0.3 10*3/uL (ref 0.1–1.0)
NEUTROS ABS: 1.7 10*3/uL (ref 1.7–7.7)
NEUTROS PCT: 45 % (ref 43–77)
Platelets: 279 10*3/uL (ref 150–400)
RBC: 4.67 MIL/uL (ref 3.87–5.11)
RDW: 13.6 % (ref 11.5–15.5)
RH TYPE: POSITIVE
Rubella: 1.06 Index — ABNORMAL HIGH (ref <0.90)
WBC: 3.8 10*3/uL — ABNORMAL LOW (ref 4.0–10.5)

## 2013-10-14 LAB — CULTURE, OB URINE
Colony Count: NO GROWTH
ORGANISM ID, BACTERIA: NO GROWTH

## 2013-10-14 LAB — TSH: TSH: 0.799 u[IU]/mL (ref 0.350–4.500)

## 2013-10-14 LAB — VARICELLA ZOSTER ANTIBODY, IGG: Varicella IgG: 3069 Index — ABNORMAL HIGH (ref <135.00)

## 2013-10-14 LAB — HIV ANTIBODY (ROUTINE TESTING W REFLEX): HIV 1&2 Ab, 4th Generation: NONREACTIVE

## 2013-10-14 LAB — VITAMIN D 25 HYDROXY (VIT D DEFICIENCY, FRACTURES): Vit D, 25-Hydroxy: 28 ng/mL — ABNORMAL LOW (ref 30–89)

## 2013-10-15 LAB — HEMOGLOBINOPATHY EVALUATION
HEMOGLOBIN OTHER: 0 %
HGB A2 QUANT: 2.5 % (ref 2.2–3.2)
HGB A: 97 % (ref 96.8–97.8)
HGB F QUANT: 0.5 % (ref 0.0–2.0)
Hgb S Quant: 0 %

## 2013-10-21 ENCOUNTER — Other Ambulatory Visit: Payer: Self-pay | Admitting: Advanced Practice Midwife

## 2013-10-21 DIAGNOSIS — Z0489 Encounter for examination and observation for other specified reasons: Secondary | ICD-10-CM

## 2013-10-21 DIAGNOSIS — IMO0002 Reserved for concepts with insufficient information to code with codable children: Secondary | ICD-10-CM

## 2013-10-21 DIAGNOSIS — O169 Unspecified maternal hypertension, unspecified trimester: Secondary | ICD-10-CM

## 2013-10-22 ENCOUNTER — Encounter: Payer: Self-pay | Admitting: Obstetrics & Gynecology

## 2013-11-01 ENCOUNTER — Encounter (HOSPITAL_COMMUNITY): Payer: Self-pay | Admitting: *Deleted

## 2013-11-01 ENCOUNTER — Inpatient Hospital Stay (HOSPITAL_COMMUNITY)
Admission: AD | Admit: 2013-11-01 | Discharge: 2013-11-01 | Disposition: A | Discharge status: Home / Self Care | Payer: Medicaid Other | Source: Ambulatory Visit | Attending: Obstetrics & Gynecology | Admitting: Obstetrics & Gynecology

## 2013-11-01 DIAGNOSIS — O99330 Smoking (tobacco) complicating pregnancy, unspecified trimester: Secondary | ICD-10-CM | POA: Insufficient documentation

## 2013-11-01 DIAGNOSIS — O210 Mild hyperemesis gravidarum: Secondary | ICD-10-CM | POA: Insufficient documentation

## 2013-11-01 DIAGNOSIS — O24919 Unspecified diabetes mellitus in pregnancy, unspecified trimester: Secondary | ICD-10-CM | POA: Insufficient documentation

## 2013-11-01 DIAGNOSIS — R197 Diarrhea, unspecified: Secondary | ICD-10-CM | POA: Insufficient documentation

## 2013-11-01 DIAGNOSIS — O219 Vomiting of pregnancy, unspecified: Secondary | ICD-10-CM

## 2013-11-01 DIAGNOSIS — E119 Type 2 diabetes mellitus without complications: Secondary | ICD-10-CM | POA: Insufficient documentation

## 2013-11-01 LAB — URINE MICROSCOPIC-ADD ON

## 2013-11-01 LAB — URINALYSIS, ROUTINE W REFLEX MICROSCOPIC
Bilirubin Urine: NEGATIVE
GLUCOSE, UA: NEGATIVE mg/dL
Hgb urine dipstick: NEGATIVE
KETONES UR: NEGATIVE mg/dL
Leukocytes, UA: NEGATIVE
NITRITE: NEGATIVE
Protein, ur: 30 mg/dL — AB
Urobilinogen, UA: 0.2 mg/dL (ref 0.0–1.0)
pH: 5.5 (ref 5.0–8.0)

## 2013-11-01 MED ORDER — PROMETHAZINE HCL 25 MG PO TABS: 25.0000 mg | ORAL_TABLET | Freq: Four times a day (QID) | ORAL | Status: DC | PRN

## 2013-11-01 MED ORDER — PROMETHAZINE HCL 25 MG/ML IJ SOLN
25.0000 mg | Freq: Once | INTRAMUSCULAR | Status: AC
  Administered 2013-11-01: 25 mg via INTRAMUSCULAR
  Filled 2013-11-01: qty 1

## 2013-11-01 NOTE — Discharge Instructions (Signed)
Morning Sickness °Morning sickness is when you feel sick to your stomach (nauseous) during pregnancy. This nauseous feeling may or may not come with vomiting. It often occurs in the morning but can be a problem any time of day. Morning sickness is most common during the first trimester, but it may continue throughout pregnancy. While morning sickness is unpleasant, it is usually harmless unless you develop severe and continual vomiting (hyperemesis gravidarum). This condition requires more intense treatment.  °CAUSES  °The cause of morning sickness is not completely known but seems to be related to normal hormonal changes that occur in pregnancy. °RISK FACTORS °You are at greater risk if you: °· Experienced nausea or vomiting before your pregnancy. °· Had morning sickness during a previous pregnancy. °· Are pregnant with more than one baby, such as twins. °TREATMENT  °Do not use any medicines (prescription, over-the-counter, or herbal) for morning sickness without first talking to your health care provider. Your health care provider may prescribe or recommend: °· Vitamin B6 supplements. °· Anti-nausea medicines. °· The herbal medicine ginger. °HOME CARE INSTRUCTIONS  °· Only take over-the-counter or prescription medicines as directed by your health care provider. °· Taking multivitamins before getting pregnant can prevent or decrease the severity of morning sickness in most women.   °· Eat a piece of dry toast or unsalted crackers before getting out of bed in the morning.   °· Eat five or six small meals a day.   °· Eat dry and bland foods (rice, baked potato ). Foods high in carbohydrates are often helpful.  °· Do not drink liquids with your meals. Drink liquids between meals.   °· Avoid greasy, fatty, and spicy foods.   °· Get someone to cook for you if the smell of any food causes nausea and vomiting.   °· If you feel nauseous after taking prenatal vitamins, take the vitamins at night or with a snack.  °· Snack  on protein foods (nuts, yogurt, cheese) between meals if you are hungry.   °· Eat unsweetened gelatins for desserts.   °· Wearing an acupressure wristband (worn for sea sickness) may be helpful.   °· Acupuncture may be helpful.   °· Do not smoke.   °· Get a humidifier to keep the air in your house free of odors.   °· Get plenty of fresh air. °SEEK MEDICAL CARE IF:  °· Your home remedies are not working, and you need medicine. °· You feel dizzy or lightheaded. °· You are losing weight. °SEEK IMMEDIATE MEDICAL CARE IF:  °· You have persistent and uncontrolled nausea and vomiting. °· You pass out (faint). °Document Released: 06/28/2006 Document Revised: 01/07/2013 Document Reviewed: 10/22/2012 °ExitCare® Patient Information ©2014 [employer]. ° °

## 2013-11-01 NOTE — MAU Note (Signed)
Patient presents with complaint of vomiting since 0700 today.

## 2013-11-01 NOTE — MAU Provider Note (Signed)
History     CSN: [id number]  Arrival date and time: 11/01/13 1124   First Provider Initiated Contact with Patient 11/01/13 1237      Chief Complaint  Patient presents with  . Emesis During Pregnancy   HPI Comments: Michelle Houston 33 y.o. [id number] [id number] presents to MAU with nausea, vomiting and diarrhea ongoing since yesterday. She thinks she ate some bad ice cream. She did not have any medications to take. She has DM but has lost down from 300 lbs and was taken off her medications. She has also been taken off her HTN medications. She has had Ultrasound and her first NOB visit. +FHT heard today     Past Medical History  Diagnosis Date  . Hypertension   . Diabetes mellitus   . PCOS (polycystic ovarian syndrome)   . Bronchitis     Past Surgical History  Procedure Laterality Date  . Carpal tunnel release    . Cholecystectomy    . Bladder repair      Family History  Problem Relation Age of Onset  . Hypertension Mother   . Diabetes Paternal Aunt   . Cancer Maternal Grandmother   . Stroke Maternal Grandfather   . Diabetes Paternal Grandmother   . Stroke Paternal Grandmother   . Cancer Father     History  Substance Use Topics  . Smoking status: Current Every Day Smoker -- 1.00 packs/day for 18 years    Types: Cigarettes  . Smokeless tobacco: Never Used  . Alcohol Use: No    Allergies: No Known Allergies  Prescriptions prior to admission  Medication Sig Dispense Refill  . acetaminophen (TYLENOL) 500 MG tablet Take 1,000 mg by mouth every 6 (six) hours as needed for headache.      . Prenat-FeCbn-FeAspGl-FA-Omega (OB COMPLETE PETITE) 35-5-1-200 MG CAPS Take 1 tablet by mouth daily.  30 capsule  12    Review of Systems  Constitutional: Negative.   HENT: Negative.   Eyes: Negative.   Respiratory: Negative.   Cardiovascular: Negative.   Gastrointestinal: Positive for nausea, vomiting and diarrhea. Negative for abdominal pain.  Genitourinary: Negative.    Musculoskeletal: Negative.   Skin: Negative.   Neurological: Negative.   Psychiatric/Behavioral: Negative.    Physical Exam   Blood pressure 125/78, pulse 84, temperature 98 F (36.7 C), temperature source Oral, resp. rate 20, height 5' 1.5" (1.562 m), weight 88.905 kg (196 lb), last menstrual period 08/03/2013, unknown if currently breastfeeding.  Physical Exam  Constitutional: She is oriented to person, place, and time. She appears well-developed and well-nourished. No distress.  HENT:  Head: Normocephalic and atraumatic.  Eyes: Pupils are equal, round, and reactive to light.  Cardiovascular: Normal rate, regular rhythm and normal heart sounds.   Respiratory: Effort normal and breath sounds normal.  GI: Soft. Bowel sounds are normal. She exhibits no distension. There is no tenderness. There is no rebound.  Genitourinary:  Not examined  Musculoskeletal: Normal range of motion.  Neurological: She is alert and oriented to person, place, and time.  Skin: Skin is warm.  Psychiatric: She has a normal mood and affect. Her behavior is normal. Judgment and thought content normal.   Results for orders placed during the hospital encounter of 11/01/13 (from the past 24 hour(s))  URINALYSIS, ROUTINE W REFLEX MICROSCOPIC     Status: Abnormal   Collection Time    11/01/13 11:45 AM      Result Value Ref Range   Color, Urine YELLOW  YELLOW  APPearance CLEAR  CLEAR   Specific Gravity, Urine >1.030 (*) 1.005 - 1.030   pH 5.5  5.0 - 8.0   Glucose, UA NEGATIVE  NEGATIVE mg/dL   Hgb urine dipstick NEGATIVE  NEGATIVE   Bilirubin Urine NEGATIVE  NEGATIVE   Ketones, ur NEGATIVE  NEGATIVE mg/dL   Protein, ur 30 (*) NEGATIVE mg/dL   Urobilinogen, UA 0.2  0.0 - 1.0 mg/dL   Nitrite NEGATIVE  NEGATIVE   Leukocytes, UA NEGATIVE  NEGATIVE  URINE MICROSCOPIC-ADD ON     Status: Abnormal   Collection Time    11/01/13 11:45 AM      Result Value Ref Range   Squamous Epithelial / LPF FEW (*) RARE    WBC, UA 0-2  <3 WBC/hpf   RBC / HPF 0-2  <3 RBC/hpf   Bacteria, UA FEW (*) RARE    MAU Course  Procedures  MDM  Phenergan 25 mg IM now/ feels better  Assessment and Plan   A: Nausea, vomiting, diarrhea in early pregnancy  P: Phenergan 25 mg po q6 hours prn Stay hydrated/ rest Follow up with Michelle Houston   Michelle Houston, Michelle Houston 11/01/2013, 12:38 PM

## 2013-11-10 ENCOUNTER — Encounter: Payer: Self-pay | Admitting: Advanced Practice Midwife

## 2013-11-10 ENCOUNTER — Ambulatory Visit (INDEPENDENT_AMBULATORY_CARE_PROVIDER_SITE_OTHER): Payer: Medicaid Other | Admitting: Advanced Practice Midwife

## 2013-11-10 ENCOUNTER — Encounter (HOSPITAL_COMMUNITY): Payer: Self-pay

## 2013-11-10 VITALS — BP 139/87 | HR 91 | Temp 98.4°F | Wt 195.0 lb

## 2013-11-10 DIAGNOSIS — Z3482 Encounter for supervision of other normal pregnancy, second trimester: Secondary | ICD-10-CM

## 2013-11-10 DIAGNOSIS — I10 Essential (primary) hypertension: Secondary | ICD-10-CM

## 2013-11-10 DIAGNOSIS — Z3480 Encounter for supervision of other normal pregnancy, unspecified trimester: Secondary | ICD-10-CM

## 2013-11-10 DIAGNOSIS — Z3481 Encounter for supervision of other normal pregnancy, first trimester: Secondary | ICD-10-CM

## 2013-11-10 NOTE — Progress Notes (Signed)
Subjective: Michelle Houston is a 33 y.o. at 14 weeks by LMP, 7wk US  Patient denies vaginal leaking of fluid or bleeding, denies contractions.  Reports negative fetal movment.  Denies concerns today.  Objective: Filed Vitals:   11/10/13 1542  BP: 139/87  Pulse: 91  Temp: 98.4 F (36.9 C)   150 FHR SP Fundal Height Fetal Position unknown Physical Examination: General appearance - alert, well appearing, and in no distress Mental status - alert, oriented to person, place, and time Eyes - pupils equal and reactive, extraocular eye movements intact Neck - supple, no significant adenopathy Lymphatics - no palpable lymphadenopathy, no hepatosplenomegaly Chest - clear to auscultation, no wheezes, rales or rhonchi, symmetric air entry Heart - normal rate, regular rhythm, normal S1, S2, no murmurs, rubs, clicks or gallops Abdomen - soft, nontender, nondistended, no masses or organomegaly Breasts - breasts appear normal, no suspicious masses, no skin or nipple changes or axillary nodes Pelvic - normal external genitalia, vulva, vagina, cervix, uterus and adnexa Rectal - normal rectal, no masses Back exam - full range of motion, no tenderness, palpable spasm or pain on motion Neurological - alert, oriented, normal speech, no focal findings or movement disorder noted Musculoskeletal - no joint tenderness, deformity or swelling Extremities - peripheral pulses normal, no pedal edema, no clubbing or cyanosis Skin - normal coloration and turgor, no rashes, no suspicious skin lesions noted   Assessment: Patient Active Problem List   Diagnosis Date Noted  . Supervision of other normal pregnancy 10/13/2013  . Chronic hypertension 10/13/2013    Plan: Patient to return to clinic in 4 weeks MFM Consult and [facility] Patient declined Quad screen, offer again [place] (she did not want to do it today) Orders Placed This Encounter  Procedures  . US OB Comp + 14 Wk    Standing  Status: Future     Number of Occurrences:      Standing Expiration Date: 01/11/2015    Order Specific Question:  Reason for Exam (SYMPTOM  OR DIAGNOSIS REQUIRED)    Answer:  Review of systems    Order Specific Question:  Preferred imaging [facility]?    [facility]  . AMB Referral to Maternal Fetal Medicine (MFM)    Referral Priority:  Routine    Referral Type:  Consultation    Number of Visits Requested:  1  . POCT urinalysis dipstick   No orders of the defined types were placed in this encounter.    Reviewed warning signs in pregnancy. Patient to call with concerns PRN. Reviewed triage location.   Amy Wilson SingerWren CNM

## 2013-11-11 LAB — POCT URINALYSIS DIPSTICK
Bilirubin, UA: NEGATIVE
Glucose, UA: NEGATIVE
KETONES UA: NEGATIVE
LEUKOCYTES UA: NEGATIVE
Nitrite, UA: NEGATIVE
PH UA: 5
PROTEIN UA: NEGATIVE
RBC UA: NEGATIVE
Spec Grav, UA: 1.01
Urobilinogen, UA: NEGATIVE

## 2013-11-12 LAB — PAP IG AND HPV HIGH-RISK: HPV DNA High Risk: NOT DETECTED

## 2013-11-27 ENCOUNTER — Inpatient Hospital Stay (HOSPITAL_COMMUNITY)
Admission: AD | Admit: 2013-11-27 | Discharge: 2013-11-27 | Disposition: A | Discharge status: Home / Self Care | Payer: Medicaid Other | Source: Ambulatory Visit | Attending: Obstetrics & Gynecology | Admitting: Obstetrics & Gynecology

## 2013-11-27 ENCOUNTER — Encounter (HOSPITAL_COMMUNITY): Payer: Self-pay | Admitting: *Deleted

## 2013-11-27 DIAGNOSIS — O10019 Pre-existing essential hypertension complicating pregnancy, unspecified trimester: Secondary | ICD-10-CM | POA: Insufficient documentation

## 2013-11-27 DIAGNOSIS — O24919 Unspecified diabetes mellitus in pregnancy, unspecified trimester: Secondary | ICD-10-CM | POA: Insufficient documentation

## 2013-11-27 DIAGNOSIS — N949 Unspecified condition associated with female genital organs and menstrual cycle: Secondary | ICD-10-CM | POA: Diagnosis not present

## 2013-11-27 DIAGNOSIS — O99330 Smoking (tobacco) complicating pregnancy, unspecified trimester: Secondary | ICD-10-CM | POA: Insufficient documentation

## 2013-11-27 DIAGNOSIS — E119 Type 2 diabetes mellitus without complications: Secondary | ICD-10-CM | POA: Diagnosis not present

## 2013-11-27 DIAGNOSIS — R109 Unspecified abdominal pain: Secondary | ICD-10-CM | POA: Diagnosis present

## 2013-11-27 LAB — URINALYSIS, ROUTINE W REFLEX MICROSCOPIC
Bilirubin Urine: NEGATIVE
Glucose, UA: NEGATIVE mg/dL
Hgb urine dipstick: NEGATIVE
KETONES UR: NEGATIVE mg/dL
LEUKOCYTES UA: NEGATIVE
NITRITE: NEGATIVE
PROTEIN: NEGATIVE mg/dL
Specific Gravity, Urine: <1.005 — ABNORMAL LOW (ref 1.005–1.030)
Urobilinogen, UA: 0.2 mg/dL (ref 0.0–1.0)
pH: 5.5 (ref 5.0–8.0)

## 2013-11-27 LAB — WET PREP, GENITAL
Clue Cells Wet Prep HPF POC: NONE SEEN
Trich, Wet Prep: NONE SEEN
WBC, Wet Prep HPF POC: NONE SEEN
YEAST WET PREP: NONE SEEN

## 2013-11-27 NOTE — MAU Provider Note (Signed)
History     CSN: [id number]  Arrival date and time: 11/27/13 1536   First Provider Initiated Contact with Patient 11/27/13 1603      Chief Complaint  Patient presents with  . Abdominal Pain   Abdominal Pain    Michelle Houston is a 33 y.o. [id number] at [redacted]w[redacted]d who presents today with sharp lower abdominal pain. She states that the pain started yesterday, and was worse today. She denies any vaginal discharge, vaginal bleeding, pain with urination or constipation. She states that the pain "comes out of nowhere". She has not found anything that makes it better. She has not tried any OTC pain medication.   Past Medical History  Diagnosis Date  . Hypertension   . Diabetes mellitus   . PCOS (polycystic ovarian syndrome)   . Bronchitis     Past Surgical History  Procedure Laterality Date  . Carpal tunnel release    . Cholecystectomy    . Bladder repair      Family History  Problem Relation Age of Onset  . Hypertension Mother   . Diabetes Paternal Aunt   . Cancer Maternal Grandmother   . Stroke Maternal Grandfather   . Diabetes Paternal Grandmother   . Stroke Paternal Grandmother   . Cancer Father     History  Substance Use Topics  . Smoking status: Current Every Day Smoker -- 1.00 packs/day for 18 years    Types: Cigarettes  . Smokeless tobacco: Never Used  . Alcohol Use: No    Allergies: No Known Allergies  Prescriptions prior to admission  Medication Sig Dispense Refill  . acetaminophen (TYLENOL) 500 MG tablet Take 500-1,000 mg by mouth every 6 (six) hours as needed for mild pain or headache.      . Prenatal Vit-Fe Fumarate-FA (PRENATAL MULTIVITAMIN) TABS tablet Take 1 tablet by mouth daily at 12 noon.        Review of Systems  Gastrointestinal: Positive for abdominal pain.   Physical Exam   Blood pressure 128/82, pulse 95, temperature 98.7 F (37.1 C), temperature source Oral, resp. rate 18, height 5' (1.524 m), weight 88.451 kg (195 lb), last menstrual  period 08/03/2013, unknown if currently breastfeeding.  Physical Exam  Nursing note and vitals reviewed. Constitutional: She is oriented to person, place, and time. She appears well-developed and well-nourished. No distress.  Cardiovascular: Normal rate.   Respiratory: Effort normal.  GI: Soft. There is no tenderness. There is no rebound.  Genitourinary:   External: no lesion Vagina: small amount of white discharge Cervix: pink, smooth, no CMT, closed/thick  Uterus:AGA, FHT with doppler   Neurological: She is alert and oriented to person, place, and time.  Skin: Skin is warm and dry.  Psychiatric: She has a normal mood and affect.    MAU Course  Procedures  Results for orders placed during the hospital encounter of 11/27/13 (from the past 24 hour(s))  URINALYSIS, ROUTINE W REFLEX MICROSCOPIC     Status: Abnormal   Collection Time    11/27/13  4:08 PM      Result Value Ref Range   Color, Urine YELLOW  YELLOW   APPearance CLEAR  CLEAR   Specific Gravity, Urine <1.005 (*) 1.005 - 1.030   pH 5.5  5.0 - 8.0   Glucose, UA NEGATIVE  NEGATIVE mg/dL   Hgb urine dipstick NEGATIVE  NEGATIVE   Bilirubin Urine NEGATIVE  NEGATIVE   Ketones, ur NEGATIVE  NEGATIVE mg/dL   Protein, ur NEGATIVE  NEGATIVE mg/dL  Urobilinogen, UA 0.2  0.0 - 1.0 mg/dL   Nitrite NEGATIVE  NEGATIVE   Leukocytes, UA NEGATIVE  NEGATIVE  WET PREP, GENITAL     Status: None   Collection Time    11/27/13  4:14 PM      Result Value Ref Range   Yeast Wet Prep HPF POC NONE SEEN  NONE SEEN   Trich, Wet Prep NONE SEEN  NONE SEEN   Clue Cells Wet Prep HPF POC NONE SEEN  NONE SEEN   WBC, Wet Prep HPF POC NONE SEEN  NONE SEEN     Assessment and Plan   1. Round ligament pain    Comfort measures reviewed Return to MAU as needed 2nd trimester precautions  Follow-up Information   [facility] (As scheduled)    Specialty:  Obstetrics and Gynecology   Contact information:   [address], [address] [place] [address] [phone]       Tawnya CrookHogan, Heather Donovan 11/27/2013, 4:16 PM

## 2013-11-27 NOTE — MAU Note (Signed)
Started yesterday, having sharp pain in lower abd continued today. Denies vag bleeding or d/c. No pain during intercourse. Denies GI or GU problems

## 2013-11-27 NOTE — Discharge Instructions (Signed)

## 2013-11-28 LAB — GC/CHLAMYDIA PROBE AMP
CT Probe RNA: NEGATIVE
GC Probe RNA: NEGATIVE

## 2013-12-08 ENCOUNTER — Encounter (HOSPITAL_COMMUNITY): Payer: Self-pay

## 2013-12-08 ENCOUNTER — Ambulatory Visit (HOSPITAL_COMMUNITY)
Admission: RE | Admit: 2013-12-08 | Discharge: 2013-12-08 | Disposition: A | Discharge status: Home / Self Care | Payer: Medicaid Other | Source: Ambulatory Visit | Attending: Obstetrics & Gynecology | Admitting: Obstetrics & Gynecology

## 2013-12-08 ENCOUNTER — Ambulatory Visit (HOSPITAL_COMMUNITY)
Admission: RE | Admit: 2013-12-08 | Discharge: 2013-12-08 | Disposition: A | Discharge status: Home / Self Care | Payer: Medicaid Other | Source: Ambulatory Visit | Attending: Advanced Practice Midwife | Admitting: Advanced Practice Midwife

## 2013-12-08 ENCOUNTER — Other Ambulatory Visit (HOSPITAL_COMMUNITY): Payer: Self-pay | Admitting: Maternal and Fetal Medicine

## 2013-12-08 DIAGNOSIS — Z823 Family history of stroke: Secondary | ICD-10-CM | POA: Insufficient documentation

## 2013-12-08 DIAGNOSIS — E119 Type 2 diabetes mellitus without complications: Secondary | ICD-10-CM | POA: Insufficient documentation

## 2013-12-08 DIAGNOSIS — I10 Essential (primary) hypertension: Secondary | ICD-10-CM

## 2013-12-08 DIAGNOSIS — Z8249 Family history of ischemic heart disease and other diseases of the circulatory system: Secondary | ICD-10-CM | POA: Diagnosis not present

## 2013-12-08 DIAGNOSIS — IMO0002 Reserved for concepts with insufficient information to code with codable children: Secondary | ICD-10-CM

## 2013-12-08 DIAGNOSIS — O10019 Pre-existing essential hypertension complicating pregnancy, unspecified trimester: Secondary | ICD-10-CM | POA: Diagnosis present

## 2013-12-08 DIAGNOSIS — O24919 Unspecified diabetes mellitus in pregnancy, unspecified trimester: Secondary | ICD-10-CM | POA: Insufficient documentation

## 2013-12-08 DIAGNOSIS — O99330 Smoking (tobacco) complicating pregnancy, unspecified trimester: Secondary | ICD-10-CM | POA: Diagnosis not present

## 2013-12-08 DIAGNOSIS — O169 Unspecified maternal hypertension, unspecified trimester: Secondary | ICD-10-CM

## 2013-12-08 DIAGNOSIS — Z833 Family history of diabetes mellitus: Secondary | ICD-10-CM | POA: Diagnosis not present

## 2013-12-08 DIAGNOSIS — E282 Polycystic ovarian syndrome: Secondary | ICD-10-CM | POA: Insufficient documentation

## 2013-12-08 DIAGNOSIS — Z3481 Encounter for supervision of other normal pregnancy, first trimester: Secondary | ICD-10-CM

## 2013-12-08 DIAGNOSIS — Z0489 Encounter for examination and observation for other specified reasons: Secondary | ICD-10-CM

## 2013-12-08 NOTE — Progress Notes (Signed)
MATERNAL FETAL MEDICINE CONSULT  Patient Name: Michelle Houston Medical Record Number:  [id number] Date of Birth: 1981-05-19 Requesting Physician Name:  Antionette Char, MD Date of Service: 12/08/2013  Chief Complaint Chronic hypertension and diabetes   History of Present Illness Michelle Houston was seen today secondary to chronic hypertension and diabetes at the request of Antionette Char, MD.  The patient is a 33 y.o. [id number],NF [redacted]w[redacted]d with an EDD of 05/10/2014, Alternate EDD Entry dating method.  She was diagnosed with hypertension in 2010 and has had good BP control with Losartan since then.  She discontinued that medication 4 months ago when she learned she was pregnant and has not started an alternative anti-hypertensive agent.  She reports her BP has been normal since she stopped the Losartan.  She also has a history of type II diabetes mellitus first diagnosed in 2009.  She was managed with metformin until she lost a significant amount of weight in 2014.  Since then she has been euglycemic without medication.  Review of Systems Pertinent items are noted in HPI.  Patient History OB History  Gravida Para Term Preterm AB SAB TAB Ectopic Multiple Living  4 2 2  1 1    2     # Outcome Date GA Lbr Len/2nd Weight Sex Delivery Anes PTL Lv  4 CUR           3 SAB 07/2012        N  2 TRM 07/1999 [redacted]w[redacted]d  7 lb 3 oz (3.26 kg) F SVD None  Y  1 TRM 02/1996 [redacted]w[redacted]d  6 lb 15 oz (3.147 kg) F SVD None  Y      Past Medical History  Diagnosis Date  . Hypertension   . Diabetes mellitus   . PCOS (polycystic ovarian syndrome)   . Bronchitis     Past Surgical History  Procedure Laterality Date  . Carpal tunnel release    . Cholecystectomy    . Bladder repair      History   Social History  . Marital Status: Legally Separated    Spouse Name: N/A    Number of Children: N/A  . Years of Education: N/A   Social History Main Topics  . Smoking status: Current Every Day Smoker -- 1.00  packs/day for 18 years    Types: Cigarettes  . Smokeless tobacco: Never Used  . Alcohol Use: No  . Drug Use: Yes    Special: Marijuana     Comment: not used in 2 months  . Sexual Activity: Yes    Birth Control/ Protection: None   Other Topics Concern  . Not on file   Social History Narrative  . No narrative on file    Family History  Problem Relation Age of Onset  . Hypertension Mother   . Diabetes Paternal Aunt   . Cancer Maternal Grandmother   . Stroke Maternal Grandfather   . Diabetes Paternal Grandmother   . Stroke Paternal Grandmother   . Cancer Father    In addition, the patient has no family history of mental retardation, birth defects, or genetic diseases.  Physical Examination Vitals - BP 117/46, Pulse 84, Weight 193 General appearance - alert, well appearing, and in no distress  Assessment and Recommendations 1.  Chronic hypertension.  Michelle Houston appears to have good BP control without medication at this time.  The fetal exposure to Losartan occurred very early in pregnancy and is very unlikely to have caused fetal harm.  In  addition, her fetal anatomic survey did not show any anomalies.  As her BP is normal I do not recommend starting another hypertensive agent at this time.  However, it is very likely that she will require such an agent later in pregnancy.  A CBC, AST, ALT, 24 hour urine collection, and serum creatinine should be performed to determine her baseline creatinine clearance and proteinuria.  This should be repeated as clinically indicated based on disease symptoms or the appearance of hypertension.  As chronic hypertension is associated with fetal growth restriction the patient should have serial growth scans every 4-6 weeks after her anatomic survey at approximately 18 weeks.  Once or twice weekly fetal surveillance should be started at 32 weeks of gestation. 2.  Type II diabetes mellitus.  Michelle Houston has been euglycemic for some time.  Thus, she does not  need to be considered a pre-gestational diabetic.  However, she is at high risk of developing gestational diabetes given her history.  Thus, I recommend performing a 1 hour glucose tolerance test immediately.  If normal it should also be repeated at 28 weeks.     I spent 30 minutes with Michelle Houston today of which 50% was face-to-face counseling.  Thank you [facility].  Please do not hesitate to contact us with questions.   Rema FendtNITSCHE,Torryn Fiske, MD

## 2013-12-11 ENCOUNTER — Encounter: Payer: Medicaid Other | Admitting: Advanced Practice Midwife

## 2013-12-14 ENCOUNTER — Ambulatory Visit (INDEPENDENT_AMBULATORY_CARE_PROVIDER_SITE_OTHER): Payer: Medicaid Other | Admitting: Obstetrics & Gynecology

## 2013-12-14 ENCOUNTER — Encounter: Payer: Self-pay | Admitting: Obstetrics & Gynecology

## 2013-12-14 VITALS — BP 132/82 | HR 86 | Temp 98.5°F | Wt 196.0 lb

## 2013-12-14 DIAGNOSIS — I10 Essential (primary) hypertension: Secondary | ICD-10-CM

## 2013-12-14 DIAGNOSIS — Z3480 Encounter for supervision of other normal pregnancy, unspecified trimester: Secondary | ICD-10-CM

## 2013-12-14 DIAGNOSIS — Z3481 Encounter for supervision of other normal pregnancy, first trimester: Secondary | ICD-10-CM

## 2013-12-14 LAB — CBC
HCT: 36.1 % (ref 36.0–46.0)
Hemoglobin: 12.8 g/dL (ref 12.0–15.0)
MCH: 30.5 pg (ref 26.0–34.0)
MCHC: 35.5 g/dL (ref 30.0–36.0)
MCV: 86 fL (ref 78.0–100.0)
Platelets: 264 10*3/uL (ref 150–400)
RBC: 4.2 MIL/uL (ref 3.87–5.11)
RDW: 12.9 % (ref 11.5–15.5)
WBC: 6.4 10*3/uL (ref 4.0–10.5)

## 2013-12-14 LAB — CREATININE, SERUM: CREATININE: 0.55 mg/dL (ref 0.50–1.10)

## 2013-12-14 LAB — POCT URINALYSIS DIPSTICK
Bilirubin, UA: NEGATIVE
Blood, UA: NEGATIVE
GLUCOSE UA: NEGATIVE
KETONES UA: NEGATIVE
Leukocytes, UA: NEGATIVE
Nitrite, UA: NEGATIVE
Protein, UA: NEGATIVE
SPEC GRAV UA: 1.015
Urobilinogen, UA: NEGATIVE
pH, UA: 5

## 2013-12-14 LAB — LACTATE DEHYDROGENASE: LDH: 128 U/L (ref 94–250)

## 2013-12-14 LAB — ALT: ALT: 17 U/L (ref 0–35)

## 2013-12-14 LAB — AST: AST: 16 U/L (ref 0–37)

## 2013-12-15 LAB — HEMOGLOBIN A1C
HEMOGLOBIN A1C: 5.7 % — AB (ref <5.7)
Mean Plasma Glucose: 117 mg/dL — ABNORMAL HIGH (ref <117)

## 2013-12-15 LAB — PROTEIN / CREATININE RATIO, URINE
Creatinine, Urine: 107.5 mg/dL
PROTEIN CREATININE RATIO: 0.05 (ref <0.15)
Total Protein, Urine: 5 mg/dL

## 2013-12-15 LAB — PATHOLOGIST SMEAR REVIEW

## 2013-12-15 NOTE — Patient Instructions (Signed)
Second Trimester of Pregnancy The second trimester is from week 13 through week 28, months 4 through 6. The second trimester is often a time when you feel your best. Your body has also adjusted to being pregnant, and you begin to feel better physically. Usually, morning sickness has lessened or quit completely, you may have more energy, and you may have an increase in appetite. The second trimester is also a time when the fetus is growing rapidly. At the end of the sixth month, the fetus is about 9 inches long and weighs about 1 pounds. You will likely begin to feel the baby move (quickening) between 18 and 20 weeks of the pregnancy. BODY CHANGES Your body goes through many changes during pregnancy. The changes vary from woman to woman.   Your weight will continue to increase. You will notice your lower abdomen bulging out.  You may begin to get stretch marks on your hips, abdomen, and breasts.  You may develop headaches that can be relieved by medicines approved by your health care provider.  You may urinate more often because the fetus is pressing on your bladder.  You may develop or continue to have heartburn as a result of your pregnancy.  You may develop constipation because certain hormones are causing the muscles that push waste through your intestines to slow down.  You may develop hemorrhoids or swollen, bulging veins (varicose veins).  You may have back pain because of the weight gain and pregnancy hormones relaxing your joints between the bones in your pelvis and as a result of a shift in weight and the muscles that support your balance.  Your breasts will continue to grow and be tender.  Your gums may bleed and may be sensitive to brushing and flossing.  Dark spots or blotches (chloasma, mask of pregnancy) may develop on your face. This will likely fade after the baby is born.  A dark line from your belly button to the pubic area (linea nigra) may appear. This will likely fade  after the baby is born.  You may have changes in your hair. These can include thickening of your hair, rapid growth, and changes in texture. Some women also have hair loss during or after pregnancy, or hair that feels dry or thin. Your hair will most likely return to normal after your baby is born. WHAT TO EXPECT AT YOUR PRENATAL VISITS During a routine prenatal visit:  You will be weighed to make sure you and the fetus are growing normally.  Your blood pressure will be taken.  Your abdomen will be measured to track your baby's growth.  The fetal heartbeat will be listened to.  Any test results from the previous visit will be discussed. Your health care provider may ask you:  How you are feeling.  If you are feeling the baby move.  If you have had any abnormal symptoms, such as leaking fluid, bleeding, severe headaches, or abdominal cramping.  If you have any questions. Other tests that may be performed during your second trimester include:  Blood tests that check for:  Low iron levels (anemia).  Gestational diabetes (between 24 and 28 weeks).  Rh antibodies.  Urine tests to check for infections, diabetes, or protein in the urine.  An ultrasound to confirm the proper growth and development of the baby.  An amniocentesis to check for possible genetic problems.  Fetal screens for spina bifida and Down syndrome. HOME CARE INSTRUCTIONS   Avoid all smoking, herbs, alcohol, and unprescribed   drugs. These chemicals affect the formation and growth of the baby.  Follow your health care provider's instructions regarding medicine use. There are medicines that are either safe or unsafe to take during pregnancy.  Exercise only as directed by your health care provider. Experiencing uterine cramps is a good sign to stop exercising.  Continue to eat regular, healthy meals.  Wear a good support bra for breast tenderness.  Do not use hot tubs, steam rooms, or saunas.  Wear your  seat belt at all times when driving.  Avoid raw meat, uncooked cheese, cat litter boxes, and soil used by cats. These carry germs that can cause birth defects in the baby.  Take your prenatal vitamins.  Try taking a stool softener (if your health care provider approves) if you develop constipation. Eat more high-fiber foods, such as fresh vegetables or fruit and whole grains. Drink plenty of fluids to keep your urine clear or pale yellow.  Take warm sitz baths to soothe any pain or discomfort caused by hemorrhoids. Use hemorrhoid cream if your health care provider approves.  If you develop varicose veins, wear support hose. Elevate your feet for 15 minutes, 3-4 times a day. Limit salt in your diet.  Avoid heavy lifting, wear low heel shoes, and practice good posture.  Rest with your legs elevated if you have leg cramps or low back pain.  Visit your dentist if you have not gone yet during your pregnancy. Use a soft toothbrush to brush your teeth and be gentle when you floss.  A sexual relationship may be continued unless your health care provider directs you otherwise.  Continue to go to all your prenatal visits as directed by your health care provider. SEEK MEDICAL CARE IF:   You have dizziness.  You have mild pelvic cramps, pelvic pressure, or nagging pain in the abdominal area.  You have persistent nausea, vomiting, or diarrhea.  You have a bad smelling vaginal discharge.  You have pain with urination. SEEK IMMEDIATE MEDICAL CARE IF:   You have a fever.  You are leaking fluid from your vagina.  You have spotting or bleeding from your vagina.  You have severe abdominal cramping or pain.  You have rapid weight gain or loss.  You have shortness of breath with chest pain.  You notice sudden or extreme swelling of your face, hands, ankles, feet, or legs.  You have not felt your baby move in over an hour.  You have severe headaches that do not go away with  medicine.  You have vision changes. Document Released: 05/01/2001 Document Revised: 05/12/2013 Document Reviewed: 07/08/2012 Exit[employer] Patient Information 2015 [employer]. This information is not intended to replace advice given to you by your health care provider. Make sure you discuss any questions you have with your health care provider.  

## 2013-12-15 NOTE — Progress Notes (Signed)
Subjective:    Michelle Houston is a 33 y.o. female being seen today for her obstetrical visit. She is at 7970w0d gestation. Patient reports: no complaints . Fetal movement: normal.  Problem List Items Addressed This Visit     Unprioritized   Supervision of other normal pregnancy - Primary   Relevant Orders      POCT urinalysis dipstick (Completed)      Hemoglobin A1c (Completed)      Lactate dehydrogenase (Completed)      Creatinine, serum (Completed)      CBC (Completed)      ALT (Completed)      AST (Completed)      Pathologist smear review      Protein / creatinine ratio, urine (Completed)   Chronic hypertension   Relevant Orders      Hemoglobin A1c (Completed)      Lactate dehydrogenase (Completed)      Creatinine, serum (Completed)      CBC (Completed)      ALT (Completed)      AST (Completed)      Pathologist smear review      Protein / creatinine ratio, urine (Completed)     Patient Active Problem List   Diagnosis Date Noted  . Supervision of other normal pregnancy 10/13/2013  . Chronic hypertension 10/13/2013   Objective:    BP 132/82  Pulse 86  Temp(Src) 98.5 F (36.9 C)  Wt 88.905 kg (196 lb)  LMP 08/03/2013 FHT: 160 BPM  Uterine Size: size equals dates     Assessment:    Pregnancy @ 116w1d    Plan:   Orders Placed This Encounter  Procedures  . Hemoglobin A1c  . Lactate dehydrogenase  . Creatinine, serum  . CBC  . ALT  . AST  . Pathologist smear review  . Protein / creatinine ratio, urine  . POCT urinalysis dipstick   Labs, problem list reviewed and updated Follow up in 4 weeks.

## 2013-12-22 ENCOUNTER — Encounter: Payer: Self-pay | Admitting: Obstetrics & Gynecology

## 2014-01-06 ENCOUNTER — Encounter (HOSPITAL_COMMUNITY): Payer: Self-pay

## 2014-01-06 ENCOUNTER — Ambulatory Visit (HOSPITAL_COMMUNITY)
Admission: RE | Admit: 2014-01-06 | Discharge: 2014-01-06 | Disposition: A | Discharge status: Home / Self Care | Payer: Medicaid Other | Source: Ambulatory Visit | Attending: Obstetrics & Gynecology | Admitting: Obstetrics & Gynecology

## 2014-01-06 VITALS — BP 128/70 | HR 93 | Wt 195.0 lb

## 2014-01-06 DIAGNOSIS — O10019 Pre-existing essential hypertension complicating pregnancy, unspecified trimester: Secondary | ICD-10-CM | POA: Insufficient documentation

## 2014-01-06 DIAGNOSIS — Z3689 Encounter for other specified antenatal screening: Secondary | ICD-10-CM | POA: Insufficient documentation

## 2014-01-06 DIAGNOSIS — E119 Type 2 diabetes mellitus without complications: Secondary | ICD-10-CM | POA: Diagnosis not present

## 2014-01-06 DIAGNOSIS — I10 Essential (primary) hypertension: Secondary | ICD-10-CM

## 2014-01-06 DIAGNOSIS — O24919 Unspecified diabetes mellitus in pregnancy, unspecified trimester: Secondary | ICD-10-CM | POA: Diagnosis not present

## 2014-01-06 DIAGNOSIS — Z3481 Encounter for supervision of other normal pregnancy, first trimester: Secondary | ICD-10-CM

## 2014-01-06 DIAGNOSIS — R7303 Prediabetes: Secondary | ICD-10-CM

## 2014-01-11 ENCOUNTER — Encounter: Payer: Self-pay | Admitting: Obstetrics & Gynecology

## 2014-01-11 ENCOUNTER — Ambulatory Visit (INDEPENDENT_AMBULATORY_CARE_PROVIDER_SITE_OTHER): Payer: Medicaid Other | Admitting: Obstetrics & Gynecology

## 2014-01-11 VITALS — BP 123/83 | HR 92 | Temp 98.0°F | Wt 191.0 lb

## 2014-01-11 DIAGNOSIS — Z3480 Encounter for supervision of other normal pregnancy, unspecified trimester: Secondary | ICD-10-CM

## 2014-01-11 DIAGNOSIS — Z3482 Encounter for supervision of other normal pregnancy, second trimester: Secondary | ICD-10-CM

## 2014-01-12 LAB — POCT URINALYSIS DIPSTICK
BILIRUBIN UA: NEGATIVE
Blood, UA: NEGATIVE
Glucose, UA: NEGATIVE
KETONES UA: NEGATIVE
LEUKOCYTES UA: NEGATIVE
NITRITE UA: NEGATIVE
Protein, UA: NEGATIVE
Spec Grav, UA: <=1.005
Urobilinogen, UA: NEGATIVE
pH, UA: 5

## 2014-02-03 ENCOUNTER — Encounter (HOSPITAL_COMMUNITY): Payer: Self-pay

## 2014-02-03 ENCOUNTER — Ambulatory Visit (HOSPITAL_COMMUNITY)
Admission: RE | Admit: 2014-02-03 | Discharge: 2014-02-03 | Disposition: A | Discharge status: Home / Self Care | Payer: Medicaid Other | Source: Ambulatory Visit | Attending: Obstetrics & Gynecology | Admitting: Obstetrics & Gynecology

## 2014-02-03 VITALS — BP 127/74 | HR 94 | Wt 195.8 lb

## 2014-02-03 DIAGNOSIS — O24919 Unspecified diabetes mellitus in pregnancy, unspecified trimester: Secondary | ICD-10-CM | POA: Diagnosis not present

## 2014-02-03 DIAGNOSIS — R7303 Prediabetes: Secondary | ICD-10-CM

## 2014-02-03 DIAGNOSIS — O10019 Pre-existing essential hypertension complicating pregnancy, unspecified trimester: Secondary | ICD-10-CM | POA: Insufficient documentation

## 2014-02-03 DIAGNOSIS — I10 Essential (primary) hypertension: Secondary | ICD-10-CM

## 2014-02-03 DIAGNOSIS — E119 Type 2 diabetes mellitus without complications: Secondary | ICD-10-CM | POA: Insufficient documentation

## 2014-02-08 ENCOUNTER — Ambulatory Visit (INDEPENDENT_AMBULATORY_CARE_PROVIDER_SITE_OTHER): Payer: Medicaid Other | Admitting: Obstetrics & Gynecology

## 2014-02-08 ENCOUNTER — Encounter: Payer: Self-pay | Admitting: Obstetrics & Gynecology

## 2014-02-08 ENCOUNTER — Other Ambulatory Visit: Payer: Medicaid Other

## 2014-02-08 VITALS — BP 129/80 | HR 82 | Temp 98.2°F | Wt 198.0 lb

## 2014-02-08 DIAGNOSIS — Z3480 Encounter for supervision of other normal pregnancy, unspecified trimester: Secondary | ICD-10-CM

## 2014-02-08 DIAGNOSIS — Z3482 Encounter for supervision of other normal pregnancy, second trimester: Secondary | ICD-10-CM

## 2014-02-08 LAB — POCT URINALYSIS DIPSTICK
Bilirubin, UA: NEGATIVE
Glucose, UA: NEGATIVE
Ketones, UA: NEGATIVE
LEUKOCYTES UA: NEGATIVE
NITRITE UA: NEGATIVE
PROTEIN UA: NEGATIVE
RBC UA: NEGATIVE
Spec Grav, UA: <=1.005
UROBILINOGEN UA: NEGATIVE
pH, UA: 5

## 2014-02-08 NOTE — Progress Notes (Signed)
Subjective:    Halee MIRELA PARSLEY is a 33 y.o. female being seen today for her obstetrical visit. She is at 105w0d gestation. Patient reports: no complaints . Fetal movement: normal.  Problem List Items Addressed This Visit   Supervision of other normal pregnancy - Primary   Relevant Orders      Glucose Tolerance, 2 Hours w/1 Hour      CBC      HIV antibody      RPR      POCT urinalysis dipstick (Completed)     Patient Active Problem List   Diagnosis Date Noted  . Prediabetes 12/22/2013  . Supervision of other normal pregnancy 10/13/2013  . Chronic hypertension 10/13/2013   Objective:    BP 129/80  Pulse 82  Temp(Src) 98.2 F (36.8 C)  Wt 198 lb (89.812 kg)  LMP 08/03/2013 FHT: 160 BPM  Uterine Size: size equals dates     Assessment:    Pregnancy @ [redacted]w[redacted]d    Plan:   Orders Placed This Encounter  Procedures  . Glucose Tolerance, 2 Hours w/1 Hour  . CBC  . HIV antibody  . RPR  . POCT urinalysis dipstick   Labs, problem list reviewed and updated Follow up in 4 weeks.

## 2014-02-09 LAB — CBC
HEMATOCRIT: 39.4 % (ref 36.0–46.0)
Hemoglobin: 13.2 g/dL (ref 12.0–15.0)
MCH: 29.7 pg (ref 26.0–34.0)
MCHC: 33.5 g/dL (ref 30.0–36.0)
MCV: 88.5 fL (ref 78.0–100.0)
PLATELETS: 268 10*3/uL (ref 150–400)
RBC: 4.45 MIL/uL (ref 3.87–5.11)
RDW: 14.4 % (ref 11.5–15.5)
WBC: 7.1 10*3/uL (ref 4.0–10.5)

## 2014-02-09 LAB — HIV ANTIBODY (ROUTINE TESTING W REFLEX): HIV: NONREACTIVE

## 2014-02-09 LAB — RPR

## 2014-02-09 LAB — GLUCOSE TOLERANCE, 2 HOURS W/ 1HR
GLUCOSE: 153 mg/dL (ref 70–170)
Glucose, 2 hour: 113 mg/dL (ref 70–139)
Glucose, Fasting: 87 mg/dL (ref 70–99)

## 2014-02-10 NOTE — Progress Notes (Signed)
Subjective:    Michelle Houston is a 33 y.o. female being seen today for her obstetrical visit. She is at [redacted]w[redacted]d gestation. Patient reports: no complaints . Fetal movement: normal.  Problem List Items Addressed This Visit     Unprioritized   Supervision of other normal pregnancy - Primary   Relevant Orders      Glucose Tolerance, 2 Hours w/1 Hour (Completed)      CBC (Completed)      HIV antibody (Completed)      RPR (Completed)      POCT urinalysis dipstick (Completed)     Patient Active Problem List   Diagnosis Date Noted  . Prediabetes 12/22/2013  . Supervision of other normal pregnancy 10/13/2013  . Chronic hypertension 10/13/2013   Objective:    BP 129/80  Pulse 82  Temp(Src) 98.2 F (36.8 C)  Wt 89.812 kg (198 lb)  LMP 08/03/2013 FHT: 160 BPM  Uterine Size: size equals dates     Assessment:    Pregnancy @ [redacted]w[redacted]d    Plan:   Orders Placed This Encounter  Procedures  . Glucose Tolerance, 2 Hours w/1 Hour  . CBC  . HIV antibody  . RPR  . POCT urinalysis dipstick   Labs, problem list reviewed and updated Follow up in 4 weeks.

## 2014-02-10 NOTE — Patient Instructions (Signed)

## 2014-02-24 ENCOUNTER — Encounter: Payer: Medicaid Other | Admitting: Obstetrics & Gynecology

## 2014-03-03 ENCOUNTER — Other Ambulatory Visit (HOSPITAL_COMMUNITY): Payer: Self-pay | Admitting: Maternal and Fetal Medicine

## 2014-03-03 ENCOUNTER — Encounter (HOSPITAL_COMMUNITY): Payer: Self-pay

## 2014-03-03 ENCOUNTER — Ambulatory Visit (HOSPITAL_COMMUNITY)
Admission: RE | Admit: 2014-03-03 | Discharge: 2014-03-03 | Disposition: A | Discharge status: Home / Self Care | Payer: Medicaid Other | Source: Ambulatory Visit | Attending: Obstetrics & Gynecology | Admitting: Obstetrics & Gynecology

## 2014-03-03 ENCOUNTER — Other Ambulatory Visit: Payer: Self-pay | Admitting: Obstetrics & Gynecology

## 2014-03-03 VITALS — BP 134/60 | HR 88 | Wt 199.8 lb

## 2014-03-03 DIAGNOSIS — O24113 Pre-existing diabetes mellitus, type 2, in pregnancy, third trimester: Secondary | ICD-10-CM | POA: Insufficient documentation

## 2014-03-03 DIAGNOSIS — O10913 Unspecified pre-existing hypertension complicating pregnancy, third trimester: Secondary | ICD-10-CM

## 2014-03-03 DIAGNOSIS — I10 Essential (primary) hypertension: Secondary | ICD-10-CM

## 2014-03-03 DIAGNOSIS — O10013 Pre-existing essential hypertension complicating pregnancy, third trimester: Secondary | ICD-10-CM | POA: Diagnosis present

## 2014-03-03 DIAGNOSIS — R7303 Prediabetes: Secondary | ICD-10-CM

## 2014-03-03 DIAGNOSIS — Z3A30 30 weeks gestation of pregnancy: Secondary | ICD-10-CM

## 2014-03-03 DIAGNOSIS — O365930 Maternal care for other known or suspected poor fetal growth, third trimester, not applicable or unspecified: Secondary | ICD-10-CM

## 2014-03-03 DIAGNOSIS — O24313 Unspecified pre-existing diabetes mellitus in pregnancy, third trimester: Secondary | ICD-10-CM

## 2014-03-03 DIAGNOSIS — E119 Type 2 diabetes mellitus without complications: Secondary | ICD-10-CM | POA: Insufficient documentation

## 2014-03-04 ENCOUNTER — Ambulatory Visit (INDEPENDENT_AMBULATORY_CARE_PROVIDER_SITE_OTHER): Payer: Medicaid Other | Admitting: Obstetrics & Gynecology

## 2014-03-04 DIAGNOSIS — Z3483 Encounter for supervision of other normal pregnancy, third trimester: Secondary | ICD-10-CM

## 2014-03-04 LAB — POCT URINALYSIS DIPSTICK
Bilirubin, UA: NEGATIVE
GLUCOSE UA: NEGATIVE
Ketones, UA: NEGATIVE
Leukocytes, UA: NEGATIVE
NITRITE UA: NEGATIVE
PH UA: 6
Protein, UA: NEGATIVE
RBC UA: NEGATIVE
SPEC GRAV UA: 1.015
UROBILINOGEN UA: NEGATIVE

## 2014-03-05 ENCOUNTER — Ambulatory Visit (INDEPENDENT_AMBULATORY_CARE_PROVIDER_SITE_OTHER): Payer: Medicaid Other | Admitting: Obstetrics

## 2014-03-05 VITALS — BP 122/82 | HR 78 | Temp 98.4°F | Wt 198.0 lb

## 2014-03-05 DIAGNOSIS — Z3483 Encounter for supervision of other normal pregnancy, third trimester: Secondary | ICD-10-CM

## 2014-03-05 DIAGNOSIS — K219 Gastro-esophageal reflux disease without esophagitis: Secondary | ICD-10-CM

## 2014-03-05 MED ORDER — OMEPRAZOLE 20 MG PO CPDR: 20.0000 mg | DELAYED_RELEASE_CAPSULE | Freq: Two times a day (BID) | ORAL | Status: DC

## 2014-03-05 NOTE — Patient Instructions (Signed)
Third Trimester of Pregnancy The third trimester is from week 29 through week 42, months 7 through 9. The third trimester is a time when the fetus is growing rapidly. At the end of the ninth month, the fetus is about 20 inches in length and weighs 6-10 pounds.  BODY CHANGES Your body goes through many changes during pregnancy. The changes vary from woman to woman.   Your weight will continue to increase. You can expect to gain 25-35 pounds (11-16 kg) by the end of the pregnancy.  You may begin to get stretch marks on your hips, abdomen, and breasts.  You may urinate more often because the fetus is moving lower into your pelvis and pressing on your bladder.  You may develop or continue to have heartburn as a result of your pregnancy.  You may develop constipation because certain hormones are causing the muscles that push waste through your intestines to slow down.  You may develop hemorrhoids or swollen, bulging veins (varicose veins).  You may have pelvic pain because of the weight gain and pregnancy hormones relaxing your joints between the bones in your pelvis. Backaches may result from overexertion of the muscles supporting your posture.  You may have changes in your hair. These can include thickening of your hair, rapid growth, and changes in texture. Some women also have hair loss during or after pregnancy, or hair that feels dry or thin. Your hair will most likely return to normal after your baby is born.  Your breasts will continue to grow and be tender. A yellow discharge may leak from your breasts called colostrum.  Your belly button may stick out.  You may feel short of breath because of your expanding uterus.  You may notice the fetus "dropping," or moving lower in your abdomen.  You may have a bloody mucus discharge. This usually occurs a few days to a week before labor begins.  Your cervix becomes thin and soft (effaced) near your due date. WHAT TO EXPECT AT YOUR PRENATAL  EXAMS  You will have prenatal exams every 2 weeks until week 36. Then, you will have weekly prenatal exams. During a routine prenatal visit:  You will be weighed to make sure you and the fetus are growing normally.  Your blood pressure is taken.  Your abdomen will be measured to track your baby's growth.  The fetal heartbeat will be listened to.  Any test results from the previous visit will be discussed.  You may have a cervical check near your due date to see if you have effaced. At around 36 weeks, your caregiver will check your cervix. At the same time, your caregiver will also perform a test on the secretions of the vaginal tissue. This test is to determine if a type of bacteria, Group B streptococcus, is present. Your caregiver will explain this further. Your caregiver may ask you:  What your birth plan is.  How you are feeling.  If you are feeling the baby move.  If you have had any abnormal symptoms, such as leaking fluid, bleeding, severe headaches, or abdominal cramping.  If you have any questions. Other tests or screenings that may be performed during your third trimester include:  Blood tests that check for low iron levels (anemia).  Fetal testing to check the health, activity level, and growth of the fetus. Testing is done if you have certain medical conditions or if there are problems during the pregnancy. FALSE LABOR You may feel small, irregular contractions that   eventually go away. These are called Braxton Hicks contractions, or false labor. Contractions may last for hours, days, or even weeks before true labor sets in. If contractions come at regular intervals, intensify, or become painful, it is best to be seen by your caregiver.  SIGNS OF LABOR   Menstrual-like cramps.  Contractions that are 5 minutes apart or less.  Contractions that start on the top of the uterus and spread down to the lower abdomen and back.  A sense of increased pelvic pressure or back  pain.  A watery or bloody mucus discharge that comes from the vagina. If you have any of these signs before the 37th week of pregnancy, call your caregiver right away. You need to go to the hospital to get checked immediately. HOME CARE INSTRUCTIONS   Avoid all smoking, herbs, alcohol, and unprescribed drugs. These chemicals affect the formation and growth of the baby.  Follow your caregiver's instructions regarding medicine use. There are medicines that are either safe or unsafe to take during pregnancy.  Exercise only as directed by your caregiver. Experiencing uterine cramps is a good sign to stop exercising.  Continue to eat regular, healthy meals.  Wear a good support bra for breast tenderness.  Do not use hot tubs, steam rooms, or saunas.  Wear your seat belt at all times when driving.  Avoid raw meat, uncooked cheese, cat litter boxes, and soil used by cats. These carry germs that can cause birth defects in the baby.  Take your prenatal vitamins.  Try taking a stool softener (if your caregiver approves) if you develop constipation. Eat more high-fiber foods, such as fresh vegetables or fruit and whole grains. Drink plenty of fluids to keep your urine clear or pale yellow.  Take warm sitz baths to soothe any pain or discomfort caused by hemorrhoids. Use hemorrhoid cream if your caregiver approves.  If you develop varicose veins, wear support hose. Elevate your feet for 15 minutes, 3-4 times a day. Limit salt in your diet.  Avoid heavy lifting, wear low heal shoes, and practice good posture.  Rest a lot with your legs elevated if you have leg cramps or low back pain.  Visit your dentist if you have not gone during your pregnancy. Use a soft toothbrush to brush your teeth and be gentle when you floss.  A sexual relationship may be continued unless your caregiver directs you otherwise.  Do not travel far distances unless it is absolutely necessary and only with the approval  of your caregiver.  Take prenatal classes to understand, practice, and ask questions about the labor and delivery.  Make a trial run to the hospital.  Pack your hospital bag.  Prepare the baby's nursery.  Continue to go to all your prenatal visits as directed by your caregiver. SEEK MEDICAL CARE IF:  You are unsure if you are in labor or if your water has broken.  You have dizziness.  You have mild pelvic cramps, pelvic pressure, or nagging pain in your abdominal area.  You have persistent nausea, vomiting, or diarrhea.  You have a bad smelling vaginal discharge.  You have pain with urination. SEEK IMMEDIATE MEDICAL CARE IF:   You have a fever.  You are leaking fluid from your vagina.  You have spotting or bleeding from your vagina.  You have severe abdominal cramping or pain.  You have rapid weight loss or gain.  You have shortness of breath with chest pain.  You notice sudden or extreme swelling   of your face, hands, ankles, feet, or legs.  You have not felt your baby move in over an hour.  You have severe headaches that do not go away with medicine.  You have vision changes. Document Released: 05/01/2001 Document Revised: 05/12/2013 Document Reviewed: 07/08/2012 [employer] Patient Information 2015 [employer]. This information is not intended to replace advice given to you by your health care provider. Make sure you discuss any questions you have with your health care provider.  

## 2014-03-05 NOTE — Progress Notes (Signed)
Subjective:    Michelle Houston is a 33 y.o. female being seen today for her obstetrical visit. She is at 693w3d gestation. Patient reports no complaints. Fetal movement: normal.  Problem List Items Addressed This Visit     Unprioritized   Supervision of other normal pregnancy - Primary   Relevant Orders      POCT urinalysis dipstick (Completed)     Patient Active Problem List   Diagnosis Date Noted  . Prediabetes 12/22/2013  . Supervision of other normal pregnancy 10/13/2013  . Chronic hypertension 10/13/2013   Objective:    LMP 08/03/2013 FHT:  140 BPM  Uterine Size: size equals dates  Presentation: cephalic     Assessment:    Pregnancy @ 193w3d weeks   Plan:     labs reviewed, problem list updated TDAP offered   Orders Placed This Encounter  Procedures  . POCT urinalysis dipstick    Follow up in 2 Weeks.

## 2014-03-05 NOTE — Progress Notes (Signed)
Subjective:    Michelle Houston is a 33 y.o. female being seen today for her obstetrical visit. She is at 8047w4d gestation. Patient reports heartburn. Fetal movement: normal.  Problem List Items Addressed This Visit   GERD without esophagitis   Relevant Medications      omeprazole (PRILOSEC) capsule    Other Visit Diagnoses   Encounter for supervision of other normal pregnancy in third trimester    -  Primary    Relevant Orders       POCT urinalysis dipstick      Patient Active Problem List   Diagnosis Date Noted  . GERD without esophagitis 03/05/2014  . Prediabetes 12/22/2013  . Supervision of other normal pregnancy 10/13/2013  . Chronic hypertension 10/13/2013   Objective:    BP 122/82  Pulse 78  Temp(Src) 98.4 F (36.9 C)  Wt 198 lb (89.812 kg)  LMP 08/03/2013 FHT:  140 BPM  Uterine Size: size equals dates  Presentation: unsure     Assessment:    Pregnancy @ 8647w4d weeks   Plan:     labs reviewed, problem list updated Consent signed. GBS sent TDAP offered  Rhogam given for RH negative Pediatrician: discussed. Infant feeding: plans to breastfeed. Maternity leave: not discussed. Cigarette smoking: never smoked. Orders Placed This Encounter  Procedures  . POCT urinalysis dipstick   Meds ordered this encounter  Medications  . omeprazole (PRILOSEC) 20 MG capsule    Sig: Take 1 capsule (20 mg total) by mouth 2 (two) times daily before a meal.    Dispense:  60 capsule    Refill:  5   Follow up in 2 Weeks.

## 2014-03-14 ENCOUNTER — Encounter: Payer: Self-pay | Admitting: Obstetrics & Gynecology

## 2014-03-14 DIAGNOSIS — Z0374 Encounter for suspected problem with fetal growth ruled out: Secondary | ICD-10-CM | POA: Insufficient documentation

## 2014-03-22 ENCOUNTER — Encounter (HOSPITAL_COMMUNITY): Payer: Self-pay

## 2014-03-24 ENCOUNTER — Ambulatory Visit (HOSPITAL_COMMUNITY)
Admission: RE | Admit: 2014-03-24 | Discharge: 2014-03-24 | Disposition: A | Discharge status: Home / Self Care | Payer: Medicaid Other | Source: Ambulatory Visit | Attending: Obstetrics & Gynecology | Admitting: Obstetrics & Gynecology

## 2014-03-24 ENCOUNTER — Encounter (HOSPITAL_COMMUNITY): Payer: Self-pay

## 2014-03-24 ENCOUNTER — Other Ambulatory Visit: Payer: Self-pay | Admitting: Obstetrics & Gynecology

## 2014-03-24 DIAGNOSIS — F17210 Nicotine dependence, cigarettes, uncomplicated: Secondary | ICD-10-CM | POA: Diagnosis not present

## 2014-03-24 DIAGNOSIS — Z3A33 33 weeks gestation of pregnancy: Secondary | ICD-10-CM | POA: Diagnosis not present

## 2014-03-24 DIAGNOSIS — I10 Essential (primary) hypertension: Secondary | ICD-10-CM

## 2014-03-24 DIAGNOSIS — R7303 Prediabetes: Secondary | ICD-10-CM

## 2014-03-24 DIAGNOSIS — O24113 Pre-existing diabetes mellitus, type 2, in pregnancy, third trimester: Secondary | ICD-10-CM | POA: Insufficient documentation

## 2014-03-24 DIAGNOSIS — O10013 Pre-existing essential hypertension complicating pregnancy, third trimester: Secondary | ICD-10-CM | POA: Diagnosis present

## 2014-03-24 DIAGNOSIS — O99333 Smoking (tobacco) complicating pregnancy, third trimester: Secondary | ICD-10-CM | POA: Insufficient documentation

## 2014-03-24 DIAGNOSIS — E119 Type 2 diabetes mellitus without complications: Secondary | ICD-10-CM | POA: Insufficient documentation

## 2014-03-24 DIAGNOSIS — O24313 Unspecified pre-existing diabetes mellitus in pregnancy, third trimester: Secondary | ICD-10-CM | POA: Insufficient documentation

## 2014-03-25 ENCOUNTER — Encounter: Payer: Self-pay | Admitting: Obstetrics & Gynecology

## 2014-03-25 ENCOUNTER — Ambulatory Visit (INDEPENDENT_AMBULATORY_CARE_PROVIDER_SITE_OTHER): Payer: Medicaid Other | Admitting: Obstetrics & Gynecology

## 2014-03-25 VITALS — BP 125/87 | HR 111 | Temp 97.7°F | Wt 201.0 lb

## 2014-03-25 DIAGNOSIS — Z3483 Encounter for supervision of other normal pregnancy, third trimester: Secondary | ICD-10-CM

## 2014-03-25 LAB — POCT URINALYSIS DIPSTICK
BILIRUBIN UA: NEGATIVE
GLUCOSE UA: NEGATIVE
KETONES UA: NEGATIVE
LEUKOCYTES UA: NEGATIVE
NITRITE UA: NEGATIVE
PH UA: 5.5
RBC UA: NEGATIVE
Spec Grav, UA: 1.015
Urobilinogen, UA: NEGATIVE

## 2014-03-27 NOTE — Patient Instructions (Signed)
Patient information: Group B streptococcus and pregnancy (Beyond the Basics)  Authors Karen M Puopolo, MD, PhD Carol J Baker, MD Section Editors Charles J Lockwood, MD Daniel J Sexton, MD Deputy Editor Vanessa A Barss, MD Disclosures  All topics are updated as new evidence becomes available and our peer review process is complete.  Literature review current through: Feb 2014.  This topic last updated: Nov 19, 2011.  INTRODUCTION - Group B streptococcus (GBS) is a bacterium that can cause serious infections in pregnant women and newborn babies. GBS is one of many types of streptococcal bacteria, sometimes called "strep." This article discusses GBS, its effect on pregnant women and infants, and ways to prevent complications of GBS. More detailed information about GBS is available by subscription. (See "Group B streptococcal infection in pregnant women".) WHAT IS GROUP B STREP INFECTION? - GBS is commonly found in the digestive system and the vagina. In healthy adults, GBS is not harmful and does not cause problems. But in pregnant women and newborn infants, being infected with GBS can cause serious illness. Approximately one in three to four pregnant women in the [place] carries GBS in their gastrointestinal system and/or in their vagina. Carrying GBS is not the same as being infected. Carriers are not sick and do not need treatment during pregnancy. There is no treatment that can stop you from carrying GBS.  Pregnant women who are carriers of GBS infrequently become infected with GBS. GBS can cause urinary tract infections, infection of the amniotic fluid (bag of water), and infection of the uterus after delivery. GBS infections during pregnancy may lead to preterm labor.  Pregnant women who carry GBS can pass on the bacteria to their newborns, and some of those babies become infected with GBS. Newborns who are infected with GBS can develop pneumonia (lung infection), septicemia (blood infection), or  meningitis (infection of the lining of the brain and spinal cord). These complications can be prevented by giving intravenous antibiotics during labor to any woman who is at risk of GBS infection. You are at risk of GBS infection if: You have a urine culture during your current pregnancy showing GBS  You have a vaginal and rectal culture during your current pregnancy showing GBS  You had an infant infected with GBS in the past GROUP B STREP PREVENTION - Most doctors and nurses recommend a urine culture early in your pregnancy to be sure that you do not have a bladder infection without symptoms. If you urine culture shows GBS or other bacteria, you may be treated with an antibiotic. If you have symptoms of urinary infection, such as pain with urination, any time during your pregnancy, a urine culture is done. If GBS grows from the urine culture, it should be treated with an antibiotic, and you should also receive intravenous antibiotics during labor. Expert groups recommend that all pregnant women have a GBS culture at 35 to 37 weeks of pregnancy. The culture is done by swabbing the vagina and rectum. If your GBS culture is positive, you will be given an intravenous antibiotic during labor. If you have preterm labor, the culture is done then and an intravenous antibiotic is given until the baby is born or the labor is stopped by your health care provider. If you have a positive GBS culture and you have an allergy to penicillin, be sure your doctor and nurse are aware of this allergy and tell them what happened with the allergy. If you had only a rash or itching, this   is not a serious allergy, and you can receive a common drug related to the penicillin. If you had a serious allergy (for example, trouble breathing, swelling of your face) you may need an additional test to determine which antibiotic should be used during labor. Being treated with an antibiotic during labor greatly reduces the chance that you or  your newborn will develop infections related to GBS. It is important to note that young infants up to age 3 months can also develop septicemia, meningitis and other serious infections from GBS. Being treated with an antibiotic during labor does not reduce the chance that your baby will develop this later type of infection. There is currently no known way of preventing this later-onset GBS disease. WHERE TO GET MORE INFORMATION - Your healthcare provider is the best source of information for questions and concerns related to your medical problem.  

## 2014-03-27 NOTE — Progress Notes (Signed)
Subjective:    Michelle Houston is a 33 y.o. female being seen today for her obstetrical visit. She is at 6331w3d gestation. Patient reports no complaints. Fetal movement: normal.  Problem List Items Addressed This Visit    Supervision of other normal pregnancy - Primary   Relevant Orders      POCT urinalysis dipstick (Completed)     Patient Active Problem List   Diagnosis Date Noted  . Chronic hypertension complicating or reason for care during pregnancy   . Pre-existing diabetes mellitus affecting pregnancy in third trimester, antepartum   . GERD without esophagitis 03/05/2014  . Supervision of other normal pregnancy 10/13/2013  . Chronic hypertension 10/13/2013   Objective:    BP 125/87 mmHg  Pulse 111  Temp(Src) 97.7 F (36.5 C)  Wt 91.173 kg (201 lb)  LMP 08/03/2013 FHT:  140 BPM  Uterine Size: size equals dates  Presentation: unsure     Assessment:    Pregnancy @ 7531w3d weeks   Plan:     labs reviewed, problem list updated  Orders Placed This Encounter  Procedures  . POCT urinalysis dipstick    Follow up in 2 Weeks.

## 2014-04-08 ENCOUNTER — Ambulatory Visit (INDEPENDENT_AMBULATORY_CARE_PROVIDER_SITE_OTHER): Payer: Medicaid Other | Admitting: Obstetrics & Gynecology

## 2014-04-08 ENCOUNTER — Encounter: Payer: Self-pay | Admitting: Obstetrics & Gynecology

## 2014-04-08 VITALS — BP 123/87 | HR 98 | Temp 98.1°F | Wt 200.0 lb

## 2014-04-08 DIAGNOSIS — Z3483 Encounter for supervision of other normal pregnancy, third trimester: Secondary | ICD-10-CM

## 2014-04-08 LAB — POCT URINALYSIS DIPSTICK
BILIRUBIN UA: NEGATIVE
GLUCOSE UA: NEGATIVE
Ketones, UA: NEGATIVE
Leukocytes, UA: NEGATIVE
NITRITE UA: NEGATIVE
RBC UA: NEGATIVE
SPEC GRAV UA: 1.015
Urobilinogen, UA: NEGATIVE
pH, UA: 6

## 2014-04-09 LAB — STREP B DNA PROBE: GBSP: DETECTED

## 2014-04-09 NOTE — Patient Instructions (Signed)
Patient information: Group B streptococcus and pregnancy (Beyond the Basics)  Authors Karen M Puopolo, MD, PhD Carol J Baker, MD Section Editors Charles J Lockwood, MD Daniel J Sexton, MD Deputy Editor Vanessa A Barss, MD Disclosures  All topics are updated as new evidence becomes available and our peer review process is complete.  Literature review current through: Feb 2014.  This topic last updated: Nov 19, 2011.  INTRODUCTION - Group B streptococcus (GBS) is a bacterium that can cause serious infections in pregnant women and newborn babies. GBS is one of many types of streptococcal bacteria, sometimes called "strep." This article discusses GBS, its effect on pregnant women and infants, and ways to prevent complications of GBS. More detailed information about GBS is available by subscription. (See "Group B streptococcal infection in pregnant women".) WHAT IS GROUP B STREP INFECTION? - GBS is commonly found in the digestive system and the vagina. In healthy adults, GBS is not harmful and does not cause problems. But in pregnant women and newborn infants, being infected with GBS can cause serious illness. Approximately one in three to four pregnant women in the [place] carries GBS in their gastrointestinal system and/or in their vagina. Carrying GBS is not the same as being infected. Carriers are not sick and do not need treatment during pregnancy. There is no treatment that can stop you from carrying GBS.  Pregnant women who are carriers of GBS infrequently become infected with GBS. GBS can cause urinary tract infections, infection of the amniotic fluid (bag of water), and infection of the uterus after delivery. GBS infections during pregnancy may lead to preterm labor.  Pregnant women who carry GBS can pass on the bacteria to their newborns, and some of those babies become infected with GBS. Newborns who are infected with GBS can develop pneumonia (lung infection), septicemia (blood infection), or  meningitis (infection of the lining of the brain and spinal cord). These complications can be prevented by giving intravenous antibiotics during labor to any woman who is at risk of GBS infection. You are at risk of GBS infection if: You have a urine culture during your current pregnancy showing GBS  You have a vaginal and rectal culture during your current pregnancy showing GBS  You had an infant infected with GBS in the past GROUP B STREP PREVENTION - Most doctors and nurses recommend a urine culture early in your pregnancy to be sure that you do not have a bladder infection without symptoms. If you urine culture shows GBS or other bacteria, you may be treated with an antibiotic. If you have symptoms of urinary infection, such as pain with urination, any time during your pregnancy, a urine culture is done. If GBS grows from the urine culture, it should be treated with an antibiotic, and you should also receive intravenous antibiotics during labor. Expert groups recommend that all pregnant women have a GBS culture at 35 to 37 weeks of pregnancy. The culture is done by swabbing the vagina and rectum. If your GBS culture is positive, you will be given an intravenous antibiotic during labor. If you have preterm labor, the culture is done then and an intravenous antibiotic is given until the baby is born or the labor is stopped by your health care provider. If you have a positive GBS culture and you have an allergy to penicillin, be sure your doctor and nurse are aware of this allergy and tell them what happened with the allergy. If you had only a rash or itching, this   is not a serious allergy, and you can receive a common drug related to the penicillin. If you had a serious allergy (for example, trouble breathing, swelling of your face) you may need an additional test to determine which antibiotic should be used during labor. Being treated with an antibiotic during labor greatly reduces the chance that you or  your newborn will develop infections related to GBS. It is important to note that young infants up to age 3 months can also develop septicemia, meningitis and other serious infections from GBS. Being treated with an antibiotic during labor does not reduce the chance that your baby will develop this later type of infection. There is currently no known way of preventing this later-onset GBS disease. WHERE TO GET MORE INFORMATION - Your healthcare provider is the best source of information for questions and concerns related to your medical problem.  

## 2014-04-09 NOTE — Progress Notes (Signed)
Subjective:    Melah Ashok CordiaG Bove is a 33 y.o. female being seen today for her obstetrical visit. She is at 4523w3d gestation. Patient reports no complaints. Fetal movement: normal.  Problem List Items Addressed This Visit    Supervision of other normal pregnancy - Primary   Relevant Orders      POCT urinalysis dipstick (Completed)      Strep B DNA probe     Patient Active Problem List   Diagnosis Date Noted  . Chronic hypertension complicating or reason for care during pregnancy   . Pre-existing diabetes mellitus affecting pregnancy in third trimester, antepartum   . GERD without esophagitis 03/05/2014  . Supervision of other normal pregnancy 10/13/2013  . Chronic hypertension 10/13/2013   Objective:    BP 123/87 mmHg  Pulse 98  Temp(Src) 98.1 F (36.7 C)  Wt 90.719 kg (200 lb)  LMP 08/03/2013 FHT:  140 BPM  Uterine Size: size equals dates  Presentation: cephalic     Assessment:    Pregnancy @ 5223w3d weeks   Plan:     labs reviewed, problem list updated  Orders Placed This Encounter  Procedures  . Strep B DNA probe  . POCT urinalysis dipstick    Follow up in 1 Week.

## 2014-04-12 ENCOUNTER — Telehealth: Payer: Self-pay

## 2014-04-12 NOTE — Telephone Encounter (Signed)
CALLED PATIENT ABOUT HER FMLA PAPERWORK - READY TO PICK UP - WILL OWE 15.00 WHEN SHE COMES IN

## 2014-04-14 ENCOUNTER — Ambulatory Visit (INDEPENDENT_AMBULATORY_CARE_PROVIDER_SITE_OTHER): Payer: Medicaid Other | Admitting: Obstetrics & Gynecology

## 2014-04-14 VITALS — BP 121/84 | HR 103 | Temp 98.8°F | Wt 200.4 lb

## 2014-04-14 DIAGNOSIS — Z3483 Encounter for supervision of other normal pregnancy, third trimester: Secondary | ICD-10-CM

## 2014-04-14 DIAGNOSIS — Z029 Encounter for administrative examinations, unspecified: Secondary | ICD-10-CM

## 2014-04-14 NOTE — Progress Notes (Signed)
Patient reports she is doing well 

## 2014-04-14 NOTE — Patient Instructions (Signed)
Group B Streptococcus Infection During Pregnancy Group B streptococcus (GBS) is a type of bacteria often found in healthy women. GBS is not the same as the bacteria that causes strep throat. You may have GBS in your vagina, rectum, or bladder. GBS does not spread through sexual contact, but it can be passed to a baby during childbirth. This can be dangerous for your baby. It is not dangerous to you and usually does not cause any symptoms. Your health care provider may test you for GBS when your pregnancy is between 35 and 37 weeks. GBS is dangerous only during birth, so there is no need to test for it earlier. It is possible to have GBS during pregnancy and never pass it to your baby. If your test results are positive for GBS, your health care provider may recommend giving you antibiotic medicine during delivery to make sure your baby stays healthy. RISK FACTORS You are more likely to pass GBS to your baby if:   Your water breaks (ruptured membrane) or you go into labor before 37 weeks.  Your water breaks 18 hours before you deliver.  You passed GBS during a previous pregnancy.  You have a urinary tract infection caused by GBS any time during pregnancy.  You have a fever during labor. SYMPTOMS Most women who have GBS do not have any symptoms. If you have a urinary tract infection caused by GBS, you might have frequent or painful urination and fever. Babies who get GBS usually show symptoms within 7 days of birth. Symptoms may include:   Breathing problems.  Heart and blood pressure problems.  Digestive and kidney problems. DIAGNOSIS Routine screening for GBS is recommended for all pregnant women. A health care provider takes a sample of the fluid in your vagina and rectum with a swab. It is then sent to a lab to be checked for GBS. A sample of your urine may also be checked for the bacteria.  TREATMENT If you test positive for GBS, you may need treatment with an antibiotic medicine during  labor. As soon as you go into labor, or as soon as your membranes rupture, you will get the antibiotic medicine through an IV access. You will continue to get the medicine until after you give birth. You do not need antibiotic medicine if you are having a cesarean delivery.If your baby shows signs or symptoms of GBS after birth, your baby can also be treated with an antibiotic medicine. HOME CARE INSTRUCTIONS   Take all antibiotic medicine as prescribed by your health care provider. Only take medicine as directed.   Continue with prenatal visits and care.   Keep all follow-up appointments.  SEEK MEDICAL CARE IF:   You have pain when you urinate.   You have to urinate frequently.   You have a fever.  SEEK IMMEDIATE MEDICAL CARE IF:   Your membranes rupture.  You go into labor. Document Released: 08/14/2007 Document Revised: 05/12/2013 Document Reviewed: 02/27/2013 [facility] Patient Information 2015 [place], [place]. This information is not intended to replace advice given to you by your health care provider. Make sure you discuss any questions you have with your health care provider. Group B Streptococcus Infection During Pregnancy Group B streptococcus (GBS) is a type of bacteria often found in healthy women. GBS is not the same as the bacteria that causes strep throat. You may have GBS in your vagina, rectum, or bladder. GBS does not spread through sexual contact, but it can be passed to a baby  during childbirth. This can be dangerous for your baby. It is not dangerous to you and usually does not cause any symptoms. Your health care provider may test you for GBS when your pregnancy is between 35 and 37 weeks. GBS is dangerous only during birth, so there is no need to test for it earlier. It is possible to have GBS during pregnancy and never pass it to your baby. If your test results are positive for GBS, your health care provider may recommend giving you antibiotic medicine during  delivery to make sure your baby stays healthy. RISK FACTORS You are more likely to pass GBS to your baby if:   Your water breaks (ruptured membrane) or you go into labor before 37 weeks.  Your water breaks 18 hours before you deliver.  You passed GBS during a previous pregnancy.  You have a urinary tract infection caused by GBS any time during pregnancy.  You have a fever during labor. SYMPTOMS Most women who have GBS do not have any symptoms. If you have a urinary tract infection caused by GBS, you might have frequent or painful urination and fever. Babies who get GBS usually show symptoms within 7 days of birth. Symptoms may include:   Breathing problems.  Heart and blood pressure problems.  Digestive and kidney problems. DIAGNOSIS Routine screening for GBS is recommended for all pregnant women. A health care provider takes a sample of the fluid in your vagina and rectum with a swab. It is then sent to a lab to be checked for GBS. A sample of your urine may also be checked for the bacteria.  TREATMENT If you test positive for GBS, you may need treatment with an antibiotic medicine during labor. As soon as you go into labor, or as soon as your membranes rupture, you will get the antibiotic medicine through an IV access. You will continue to get the medicine until after you give birth. You do not need antibiotic medicine if you are having a cesarean delivery.If your baby shows signs or symptoms of GBS after birth, your baby can also be treated with an antibiotic medicine. HOME CARE INSTRUCTIONS   Take all antibiotic medicine as prescribed by your health care provider. Only take medicine as directed.   Continue with prenatal visits and care.   Keep all follow-up appointments.  SEEK MEDICAL CARE IF:   You have pain when you urinate.   You have to urinate frequently.   You have a fever.  SEEK IMMEDIATE MEDICAL CARE IF:   Your membranes rupture.  You go into  labor. Document Released: 08/14/2007 Document Revised: 05/12/2013 Document Reviewed: 02/27/2013 [facility] Patient Information 2015 [place], [place]. This information is not intended to replace advice given to you by your health care provider. Make sure you discuss any questions you have with your health care provider. Group B Streptococcus Infection During Pregnancy Group B streptococcus (GBS) is a type of bacteria often found in healthy women. GBS is not the same as the bacteria that causes strep throat. You may have GBS in your vagina, rectum, or bladder. GBS does not spread through sexual contact, but it can be passed to a baby during childbirth. This can be dangerous for your baby. It is not dangerous to you and usually does not cause any symptoms. Your health care provider may test you for GBS when your pregnancy is between 35 and 37 weeks. GBS is dangerous only during birth, so there is no need to test for it earlier.  It is possible to have GBS during pregnancy and never pass it to your baby. If your test results are positive for GBS, your health care provider may recommend giving you antibiotic medicine during delivery to make sure your baby stays healthy. RISK FACTORS You are more likely to pass GBS to your baby if:   Your water breaks (ruptured membrane) or you go into labor before 37 weeks.  Your water breaks 18 hours before you deliver.  You passed GBS during a previous pregnancy.  You have a urinary tract infection caused by GBS any time during pregnancy.  You have a fever during labor. SYMPTOMS Most women who have GBS do not have any symptoms. If you have a urinary tract infection caused by GBS, you might have frequent or painful urination and fever. Babies who get GBS usually show symptoms within 7 days of birth. Symptoms may include:   Breathing problems.  Heart and blood pressure problems.  Digestive and kidney problems. DIAGNOSIS Routine screening for GBS is recommended for  all pregnant women. A health care provider takes a sample of the fluid in your vagina and rectum with a swab. It is then sent to a lab to be checked for GBS. A sample of your urine may also be checked for the bacteria.  TREATMENT If you test positive for GBS, you may need treatment with an antibiotic medicine during labor. As soon as you go into labor, or as soon as your membranes rupture, you will get the antibiotic medicine through an IV access. You will continue to get the medicine until after you give birth. You do not need antibiotic medicine if you are having a cesarean delivery.If your baby shows signs or symptoms of GBS after birth, your baby can also be treated with an antibiotic medicine. HOME CARE INSTRUCTIONS   Take all antibiotic medicine as prescribed by your health care provider. Only take medicine as directed.   Continue with prenatal visits and care.   Keep all follow-up appointments.  SEEK MEDICAL CARE IF:   You have pain when you urinate.   You have to urinate frequently.   You have a fever.  SEEK IMMEDIATE MEDICAL CARE IF:   Your membranes rupture.  You go into labor. Document Released: 08/14/2007 Document Revised: 05/12/2013 Document Reviewed: 02/27/2013 [facility] Patient Information 2015 [place], [place]. This information is not intended to replace advice given to you by your health care provider. Make sure you discuss any questions you have with your health care provider. Group B Streptococcus Infection During Pregnancy Group B streptococcus (GBS) is a type of bacteria often found in healthy women. GBS is not the same as the bacteria that causes strep throat. You may have GBS in your vagina, rectum, or bladder. GBS does not spread through sexual contact, but it can be passed to a baby during childbirth. This can be dangerous for your baby. It is not dangerous to you and usually does not cause any symptoms. Your health care provider may test you for GBS when your  pregnancy is between 35 and 37 weeks. GBS is dangerous only during birth, so there is no need to test for it earlier. It is possible to have GBS during pregnancy and never pass it to your baby. If your test results are positive for GBS, your health care provider may recommend giving you antibiotic medicine during delivery to make sure your baby stays healthy. RISK FACTORS You are more likely to pass GBS to your baby if:  Your water breaks (ruptured membrane) or you go into labor before 37 weeks.  Your water breaks 18 hours before you deliver.  You passed GBS during a previous pregnancy.  You have a urinary tract infection caused by GBS any time during pregnancy.  You have a fever during labor. SYMPTOMS Most women who have GBS do not have any symptoms. If you have a urinary tract infection caused by GBS, you might have frequent or painful urination and fever. Babies who get GBS usually show symptoms within 7 days of birth. Symptoms may include:   Breathing problems.  Heart and blood pressure problems.  Digestive and kidney problems. DIAGNOSIS Routine screening for GBS is recommended for all pregnant women. A health care provider takes a sample of the fluid in your vagina and rectum with a swab. It is then sent to a lab to be checked for GBS. A sample of your urine may also be checked for the bacteria.  TREATMENT If you test positive for GBS, you may need treatment with an antibiotic medicine during labor. As soon as you go into labor, or as soon as your membranes rupture, you will get the antibiotic medicine through an IV access. You will continue to get the medicine until after you give birth. You do not need antibiotic medicine if you are having a cesarean delivery.If your baby shows signs or symptoms of GBS after birth, your baby can also be treated with an antibiotic medicine. HOME CARE INSTRUCTIONS   Take all antibiotic medicine as prescribed by your health care provider. Only take  medicine as directed.   Continue with prenatal visits and care.   Keep all follow-up appointments.  SEEK MEDICAL CARE IF:   You have pain when you urinate.   You have to urinate frequently.   You have a fever.  SEEK IMMEDIATE MEDICAL CARE IF:   Your membranes rupture.  You go into labor. Document Released: 08/14/2007 Document Revised: 05/12/2013 Document Reviewed: 02/27/2013 [facility] Patient Information 2015 [place], [place]. This information is not intended to replace advice given to you by your health care provider. Make sure you discuss any questions you have with your health care provider.

## 2014-04-16 LAB — POCT URINALYSIS DIPSTICK
Bilirubin, UA: NEGATIVE
Blood, UA: NEGATIVE
Glucose, UA: 100
KETONES UA: NEGATIVE
LEUKOCYTES UA: NEGATIVE
Nitrite, UA: NEGATIVE
Protein, UA: NEGATIVE
Spec Grav, UA: 1.015
UROBILINOGEN UA: NEGATIVE
pH, UA: 5

## 2014-04-19 ENCOUNTER — Telehealth: Payer: Self-pay | Admitting: *Deleted

## 2014-04-19 NOTE — Telephone Encounter (Signed)
Patient is calling with a cough and wants to know what to take. 5:38 Call to patient - may use Robitussin products, benadryl, allergy medications (Claritin,Zyrtec) - Patient advised to call back if symptoms do not improve or has fever.

## 2014-04-20 ENCOUNTER — Encounter (HOSPITAL_COMMUNITY): Payer: Self-pay

## 2014-04-20 ENCOUNTER — Ambulatory Visit (HOSPITAL_COMMUNITY)
Admission: RE | Admit: 2014-04-20 | Discharge: 2014-04-20 | Disposition: A | Discharge status: Home / Self Care | Payer: Medicaid Other | Source: Ambulatory Visit | Attending: Obstetrics & Gynecology | Admitting: Obstetrics & Gynecology

## 2014-04-20 DIAGNOSIS — R7303 Prediabetes: Secondary | ICD-10-CM

## 2014-04-20 DIAGNOSIS — E119 Type 2 diabetes mellitus without complications: Secondary | ICD-10-CM | POA: Insufficient documentation

## 2014-04-20 DIAGNOSIS — O24113 Pre-existing diabetes mellitus, type 2, in pregnancy, third trimester: Secondary | ICD-10-CM | POA: Insufficient documentation

## 2014-04-20 DIAGNOSIS — I10 Essential (primary) hypertension: Secondary | ICD-10-CM

## 2014-04-20 DIAGNOSIS — O99333 Smoking (tobacco) complicating pregnancy, third trimester: Secondary | ICD-10-CM | POA: Insufficient documentation

## 2014-04-20 DIAGNOSIS — Z3A37 37 weeks gestation of pregnancy: Secondary | ICD-10-CM | POA: Insufficient documentation

## 2014-04-20 DIAGNOSIS — O10013 Pre-existing essential hypertension complicating pregnancy, third trimester: Secondary | ICD-10-CM | POA: Insufficient documentation

## 2014-04-20 DIAGNOSIS — F17210 Nicotine dependence, cigarettes, uncomplicated: Secondary | ICD-10-CM | POA: Diagnosis not present

## 2014-04-20 NOTE — Progress Notes (Signed)
Subjective:    Michelle Houston is a 33 y.o. female being seen today for her obstetrical visit. She is at 2342w2d gestation. Patient reports no complaints. Fetal movement: normal.  Problem List Items Addressed This Visit    Supervision of other normal pregnancy - Primary   Relevant Orders      POCT urinalysis dipstick (Completed)     Patient Active Problem List   Diagnosis Date Noted  . Chronic hypertension complicating or reason for care during pregnancy   . Pre-existing diabetes mellitus affecting pregnancy in third trimester, antepartum   . GERD without esophagitis 03/05/2014  . Supervision of other normal pregnancy 10/13/2013  . Chronic hypertension 10/13/2013   Objective:    BP 121/84 mmHg  Pulse 103  Temp(Src) 98.8 F (37.1 C)  Wt 90.901 kg (200 lb 6.4 oz)  LMP 08/03/2013 FHT:  140 BPM  Uterine Size: size equals dates  Presentation: cephalic     Assessment:    Pregnancy @ 5042w2d weeks   Plan:     labs reviewed, problem list updated  Orders Placed This Encounter  Procedures  . POCT urinalysis dipstick    Follow up in 1 Week.

## 2014-04-21 ENCOUNTER — Encounter: Payer: Self-pay | Admitting: Obstetrics & Gynecology

## 2014-04-21 ENCOUNTER — Encounter: Payer: Medicaid Other | Admitting: Obstetrics & Gynecology

## 2014-04-21 DIAGNOSIS — O99820 Streptococcus B carrier state complicating pregnancy: Secondary | ICD-10-CM | POA: Insufficient documentation

## 2014-04-21 DIAGNOSIS — O10013 Pre-existing essential hypertension complicating pregnancy, third trimester: Secondary | ICD-10-CM | POA: Insufficient documentation

## 2014-04-21 DIAGNOSIS — Z3A37 37 weeks gestation of pregnancy: Secondary | ICD-10-CM | POA: Insufficient documentation

## 2014-04-23 ENCOUNTER — Encounter: Payer: Medicaid Other | Admitting: Obstetrics & Gynecology

## 2014-04-23 ENCOUNTER — Ambulatory Visit (INDEPENDENT_AMBULATORY_CARE_PROVIDER_SITE_OTHER): Payer: Medicaid Other | Admitting: Obstetrics & Gynecology

## 2014-04-23 VITALS — BP 122/84 | HR 99 | Temp 98.0°F | Wt 201.0 lb

## 2014-04-23 DIAGNOSIS — Z3483 Encounter for supervision of other normal pregnancy, third trimester: Secondary | ICD-10-CM

## 2014-04-23 LAB — POCT URINALYSIS DIPSTICK
Bilirubin, UA: NEGATIVE
Glucose, UA: 100
Ketones, UA: NEGATIVE
Leukocytes, UA: NEGATIVE
NITRITE UA: NEGATIVE
PH UA: 5
RBC UA: NEGATIVE
Spec Grav, UA: 1.015
Urobilinogen, UA: NEGATIVE

## 2014-04-25 NOTE — Patient Instructions (Signed)
Pregnancy, The Father's Role A father has an important role during their partners pregnancy, labor, delivery and afterward. It is important to help and support your partner through this new period. There are many physical and emotional changes that happen. To be helpful and supportive during this time, you should know and understand what is happening to your partner during the pregnancy, labor, delivery and postpartum period.  PREGNANCY Pregnancy lasts 40 weeks (plus or minus 2 weeks). The pregnancy is divided into three trimesters.   In the first 13 weeks, the mother feels tired, has painful breasts, may feel sick to her stomach (nauseated), throw up (vomit), urinates more often and may have mood changes. All of these changes are normal. If the father is aware of these, he can be more helpful, supportive and understanding. This may include helping with household duties and activities and spending more time with each other.  In the next 14 to 28 weeks, your partner is over the tiredness, nausea and vomiting. She will likely feel better and more energetic. This is the best time of the pregnancy to be more active together, go out more often or take trips. You will be able to see her belly popping out with the pregnancy. You may be able to feel the baby kick.  In the last 12 weeks, she may become more uncomfortable again because her abdomen is popping out more as the baby grows. She may have a hard time doing household chores, her balance may be off, she may have a hard time bending over, tires easily and has a tough time sleeping. At this time, you will realize the birth of your baby is close. You and your partner may have concerns about the safety of your partner and if the baby will be normal and healthy. These are all normal and natural feelings. You should talk with each other and your caregiver if you have any questions. Attend prenatal care visits with your partner. This is a good time for you to get  to know your caregiver, follow the pregnancy and ask questions. Prenatal visits are once a month for 6 months, then they are every 2 weeks for 2 months and then once a week the last month. You may have more prenatal visits if your caregiver feels it is needed. Your caregiver usually does an ultrasound of the baby at one of the prenatal visits or more often if needed. It is an exciting and emotional to see the baby moving and the heart beating.  Fathers can experience emotional changes during this time as well. These emotions can include happiness, excitement and feeling proud. Fathers may also be concerned about having new responsibilities. These include financial, educational and if it will change the relationship with his partner. These feelings are normal. They should be talked about openly and positively with each other. An important and often asked question is if sexual intercourse is safe during pregnancy and if it will harm the baby. Sexual intercourse is safe unless there is a problem with the pregnancy and your caregiver advises you to not have sexual intercourse. Because physical and emotional changes happen in pregnancy, your partner may not want to have sex during certain times. This is mostly true in the first and third trimesters. Trying different positions may make sexual intercourse comfortable. It is important for the both of you to discuss your feelings and desires with this problem. Talk to your caregiver about any questions you have about sexual intercourse during the   pregnancy. LABOR AND DELIVERY There are childbirth classes available for couples to take together. They help you understand what happens during labor and delivery. They also teach you how to help your partner with her labor pains, how to relax, breath properly during a contraction and focus on what is happening during labor. You may be asked to time the contractions, massage her back and breath with her during the contractions.  You are also there to see and enjoy the excitement your baby being born. If you have any feelings of fainting or are uncomfortable, tell someone to help you. You may be asked to leave the room if a problem develops during the labor or delivery. Sometimes a Cesarean Section (C-section) is scheduled or is an emergency during labor and delivery. A C-section is a major operation to deliver the baby. It is done through an incision in the abdomen and uterus. Your partner will be given a medicine to make her sleep (general anesthesia) or spinal anesthesia (numbing the body from the waist down). Most hospitals allow the father in the room for a C-section unless it is an emergency. Recovery from a C-section takes longer, is more uncomfortable and will require more help from the father. AFTER DELIVERY After the baby is born, the mother goes through many changes again. These changes could last 4 to 6 weeks or longer following a C-section. It is not unusual to be anxious, concerned and afraid that you may not be taking care of your newborn baby properly. Your partner may take a while to regain her strength. She may also get feelings of sadness (postpartum blues or depression), which is a more serious condition that may require medical treatment.  Your partner may decide to breastfeed the baby. This helps with bonding between the mother and the baby. Breastfeeding is the best way to feed the baby, but you may feel "left out." However, you can feel included by burping the baby and bottle feeding the baby with breast milk (collected by the mother) to give your partner some rest. This also helps you to bond with the baby. Breastfeeding mothers can get pregnant even if they are not having menstrual periods. Therefore, some form of birth control should be used if you do not want to get pregnant. Another question and concern is when it is safe to have sexual intercourse again. Usually it takes 4 to 6 weeks for healing to be over  with. It may take longer after a C-section. If you have any questions about having sexual intercourse or if it is painful, talk to your caregiver. As a father, you will be adjusting your role as the baby grows. Fatherhood is a on-going learning experience. You and your partner should still make time to be together alone and be the couple you were before the baby was born. This is helpful for you, your partner and your baby. As you can see, it is important for a father to be helpful, understanding and supportive during this special time. Document Released: 10/24/2007 Document Revised: 07/30/2011 Document Reviewed: 10/24/2007 [employer] Patient Information 2015 [employer]. This information is not intended to replace advice given to you by your health care provider. Make sure you discuss any questions you have with your health care provider.  

## 2014-04-25 NOTE — Progress Notes (Signed)
Subjective:    Michelle Houston is a 33 y.o. female being seen today for her obstetrical visit. She is at 6356w4d gestation. Patient reports no complaints. Fetal movement: normal.  Problem List Items Addressed This Visit    Supervision of other normal pregnancy - Primary   Relevant Orders      POCT urinalysis dipstick (Completed)     Patient Active Problem List   Diagnosis Date Noted  . GBS (group B Streptococcus carrier), +RV culture, currently pregnant 04/21/2014  . Pre-existing essential hypertension affecting pregnancy in third trimester   . Pre-existing diabetes mellitus affecting pregnancy in third trimester, antepartum   . GERD without esophagitis 03/05/2014  . Supervision of other normal pregnancy 10/13/2013  . Chronic hypertension 10/13/2013   Objective:    BP 122/84 mmHg  Pulse 99  Temp(Src) 98 F (36.7 C)  Wt 91.173 kg (201 lb)  LMP 08/03/2013 FHT:  140 BPM  Uterine Size: size equals dates  Presentation: cephalic     Assessment:    Pregnancy @ 3256w4d weeks   Plan:     labs reviewed, problem list updated  Orders Placed This Encounter  Procedures  . POCT urinalysis dipstick    Follow up in 1 Week.

## 2014-04-29 ENCOUNTER — Ambulatory Visit (INDEPENDENT_AMBULATORY_CARE_PROVIDER_SITE_OTHER): Payer: Medicaid Other | Admitting: Obstetrics & Gynecology

## 2014-04-29 ENCOUNTER — Encounter: Payer: Self-pay | Admitting: Obstetrics & Gynecology

## 2014-04-29 VITALS — BP 126/87 | HR 104 | Temp 97.0°F | Wt 203.0 lb

## 2014-04-29 DIAGNOSIS — Z3483 Encounter for supervision of other normal pregnancy, third trimester: Secondary | ICD-10-CM

## 2014-04-29 LAB — POCT URINALYSIS DIPSTICK
Bilirubin, UA: NEGATIVE
Blood, UA: NEGATIVE
GLUCOSE UA: NEGATIVE
Ketones, UA: NEGATIVE
Leukocytes, UA: NEGATIVE
Nitrite, UA: NEGATIVE
PROTEIN UA: NEGATIVE
Urobilinogen, UA: NEGATIVE
pH, UA: 5

## 2014-04-30 NOTE — Progress Notes (Signed)
Subjective:    Michelle Houston is a 33 y.o. female being seen today for her obstetrical visit. She is at 2254w3d gestation. Patient reports no complaints. Fetal movement: normal.  Problem List Items Addressed This Visit    Supervision of other normal pregnancy - Primary   Relevant Orders      POCT urinalysis dipstick (Completed)     Patient Active Problem List   Diagnosis Date Noted  . GBS (group B Streptococcus carrier), +RV culture, currently pregnant 04/21/2014  . Pre-existing essential hypertension affecting pregnancy in third trimester   . Pre-existing diabetes mellitus affecting pregnancy in third trimester, antepartum   . GERD without esophagitis 03/05/2014  . Supervision of other normal pregnancy 10/13/2013  . Chronic hypertension 10/13/2013   Objective:    BP 126/87 mmHg  Pulse 104  Temp(Src) 97 F (36.1 C)  Wt 92.08 kg (203 lb)  LMP 08/03/2013 FHT:  140 BPM  Uterine Size: size equals dates  Presentation: cephalic   Membranes stripped  Assessment:    Pregnancy @ 3254w3d weeks   Plan:     labs reviewed, problem list updated  Orders Placed This Encounter  Procedures  . POCT urinalysis dipstick    Follow up in 1 Week.

## 2014-04-30 NOTE — Progress Notes (Signed)
O2 Sat. 99%   Peek Flow  [phone]

## 2014-04-30 NOTE — Patient Instructions (Signed)
Pregnancy, The Father's Role A father has an important role during their partners pregnancy, labor, delivery and afterward. It is important to help and support your partner through this new period. There are many physical and emotional changes that happen. To be helpful and supportive during this time, you should know and understand what is happening to your partner during the pregnancy, labor, delivery and postpartum period.  PREGNANCY Pregnancy lasts 40 weeks (plus or minus 2 weeks). The pregnancy is divided into three trimesters.   In the first 13 weeks, the mother feels tired, has painful breasts, may feel sick to her stomach (nauseated), throw up (vomit), urinates more often and may have mood changes. All of these changes are normal. If the father is aware of these, he can be more helpful, supportive and understanding. This may include helping with household duties and activities and spending more time with each other.  In the next 14 to 28 weeks, your partner is over the tiredness, nausea and vomiting. She will likely feel better and more energetic. This is the best time of the pregnancy to be more active together, go out more often or take trips. You will be able to see her belly popping out with the pregnancy. You may be able to feel the baby kick.  In the last 12 weeks, she may become more uncomfortable again because her abdomen is popping out more as the baby grows. She may have a hard time doing household chores, her balance may be off, she may have a hard time bending over, tires easily and has a tough time sleeping. At this time, you will realize the birth of your baby is close. You and your partner may have concerns about the safety of your partner and if the baby will be normal and healthy. These are all normal and natural feelings. You should talk with each other and your caregiver if you have any questions. Attend prenatal care visits with your partner. This is a good time for you to get  to know your caregiver, follow the pregnancy and ask questions. Prenatal visits are once a month for 6 months, then they are every 2 weeks for 2 months and then once a week the last month. You may have more prenatal visits if your caregiver feels it is needed. Your caregiver usually does an ultrasound of the baby at one of the prenatal visits or more often if needed. It is an exciting and emotional to see the baby moving and the heart beating.  Fathers can experience emotional changes during this time as well. These emotions can include happiness, excitement and feeling proud. Fathers may also be concerned about having new responsibilities. These include financial, educational and if it will change the relationship with his partner. These feelings are normal. They should be talked about openly and positively with each other. An important and often asked question is if sexual intercourse is safe during pregnancy and if it will harm the baby. Sexual intercourse is safe unless there is a problem with the pregnancy and your caregiver advises you to not have sexual intercourse. Because physical and emotional changes happen in pregnancy, your partner may not want to have sex during certain times. This is mostly true in the first and third trimesters. Trying different positions may make sexual intercourse comfortable. It is important for the both of you to discuss your feelings and desires with this problem. Talk to your caregiver about any questions you have about sexual intercourse during the   pregnancy. LABOR AND DELIVERY There are childbirth classes available for couples to take together. They help you understand what happens during labor and delivery. They also teach you how to help your partner with her labor pains, how to relax, breath properly during a contraction and focus on what is happening during labor. You may be asked to time the contractions, massage her back and breath with her during the contractions.  You are also there to see and enjoy the excitement your baby being born. If you have any feelings of fainting or are uncomfortable, tell someone to help you. You may be asked to leave the room if a problem develops during the labor or delivery. Sometimes a Cesarean Section (C-section) is scheduled or is an emergency during labor and delivery. A C-section is a major operation to deliver the baby. It is done through an incision in the abdomen and uterus. Your partner will be given a medicine to make her sleep (general anesthesia) or spinal anesthesia (numbing the body from the waist down). Most hospitals allow the father in the room for a C-section unless it is an emergency. Recovery from a C-section takes longer, is more uncomfortable and will require more help from the father. AFTER DELIVERY After the baby is born, the mother goes through many changes again. These changes could last 4 to 6 weeks or longer following a C-section. It is not unusual to be anxious, concerned and afraid that you may not be taking care of your newborn baby properly. Your partner may take a while to regain her strength. She may also get feelings of sadness (postpartum blues or depression), which is a more serious condition that may require medical treatment.  Your partner may decide to breastfeed the baby. This helps with bonding between the mother and the baby. Breastfeeding is the best way to feed the baby, but you may feel "left out." However, you can feel included by burping the baby and bottle feeding the baby with breast milk (collected by the mother) to give your partner some rest. This also helps you to bond with the baby. Breastfeeding mothers can get pregnant even if they are not having menstrual periods. Therefore, some form of birth control should be used if you do not want to get pregnant. Another question and concern is when it is safe to have sexual intercourse again. Usually it takes 4 to 6 weeks for healing to be over  with. It may take longer after a C-section. If you have any questions about having sexual intercourse or if it is painful, talk to your caregiver. As a father, you will be adjusting your role as the baby grows. Fatherhood is a on-going learning experience. You and your partner should still make time to be together alone and be the couple you were before the baby was born. This is helpful for you, your partner and your baby. As you can see, it is important for a father to be helpful, understanding and supportive during this special time. Document Released: 10/24/2007 Document Revised: 07/30/2011 Document Reviewed: 10/24/2007 [employer] Patient Information 2015 [employer]. This information is not intended to replace advice given to you by your health care provider. Make sure you discuss any questions you have with your health care provider.  

## 2014-05-06 ENCOUNTER — Encounter: Payer: Medicaid Other | Admitting: Obstetrics & Gynecology

## 2014-05-07 ENCOUNTER — Ambulatory Visit (INDEPENDENT_AMBULATORY_CARE_PROVIDER_SITE_OTHER): Payer: Medicaid Other | Admitting: Obstetrics

## 2014-05-07 VITALS — BP 132/90 | HR 97 | Temp 97.1°F | Wt 204.0 lb

## 2014-05-07 DIAGNOSIS — Z3483 Encounter for supervision of other normal pregnancy, third trimester: Secondary | ICD-10-CM

## 2014-05-07 LAB — POCT URINALYSIS DIPSTICK
Bilirubin, UA: NEGATIVE
Blood, UA: NEGATIVE
Glucose, UA: NEGATIVE
KETONES UA: NEGATIVE
Leukocytes, UA: NEGATIVE
Nitrite, UA: NEGATIVE
Protein, UA: NEGATIVE
SPEC GRAV UA: 1.02
Urobilinogen, UA: NEGATIVE
pH, UA: 5

## 2014-05-08 ENCOUNTER — Encounter: Payer: Self-pay | Admitting: Obstetrics

## 2014-05-08 NOTE — Progress Notes (Signed)
Subjective:    Michelle Houston is a 33 y.o. female being seen today for her obstetrical visit. She is at 3143w5d gestation. Patient reports no complaints. Fetal movement: normal.  Problem List Items Addressed This Visit    Supervision of other normal pregnancy - Primary   Relevant Orders      POCT urinalysis dipstick (Completed)     Patient Active Problem List   Diagnosis Date Noted  . GBS (group B Streptococcus carrier), +RV culture, currently pregnant 04/21/2014  . Pre-existing essential hypertension affecting pregnancy in third trimester   . Pre-existing diabetes mellitus affecting pregnancy in third trimester, antepartum   . GERD without esophagitis 03/05/2014  . Supervision of other normal pregnancy 10/13/2013  . Chronic hypertension 10/13/2013    Objective:    BP 132/90 mmHg  Pulse 97  Temp(Src) 97.1 F (36.2 C)  Wt 204 lb (92.534 kg)  LMP 08/03/2013 FHT: 150 BPM  Uterine Size: size equals dates  Presentations: cephalic  Pelvic Exam:              Dilation: 1cm       Effacement: 50%             Station:  -3    Consistency: medium            Position: posterior     Assessment:    Pregnancy @ 6443w5d weeks   Plan:   Plans for delivery: Vaginal anticipated; labs reviewed; problem list updated Counseling: Consent signed. Infant feeding: plans to breastfeed. Cigarette smoking: smokes 1 PPD. L&D discussion: symptoms of labor, discussed when to call, discussed what number to call, anesthetic/analgesic options reviewed and delivering clinician:  plans Physician. Postpartum supports and preparation: circumcision discussed and contraception plans discussed.  Follow up in 1 Week.

## 2014-05-11 ENCOUNTER — Encounter: Payer: Self-pay | Admitting: Obstetrics

## 2014-05-11 ENCOUNTER — Inpatient Hospital Stay (HOSPITAL_COMMUNITY)
Admission: AD | Admit: 2014-05-11 | Discharge: 2014-05-13 | DRG: 775 | Disposition: A | Discharge status: Home / Self Care | Payer: Medicaid Other | Source: Ambulatory Visit | Attending: Obstetrics & Gynecology | Admitting: Obstetrics & Gynecology

## 2014-05-11 ENCOUNTER — Ambulatory Visit (INDEPENDENT_AMBULATORY_CARE_PROVIDER_SITE_OTHER): Payer: Medicaid Other | Admitting: Obstetrics

## 2014-05-11 ENCOUNTER — Encounter (HOSPITAL_COMMUNITY): Payer: Self-pay | Admitting: *Deleted

## 2014-05-11 VITALS — BP 139/97 | HR 112 | Temp 98.1°F | Wt 204.0 lb

## 2014-05-11 DIAGNOSIS — O133 Gestational [pregnancy-induced] hypertension without significant proteinuria, third trimester: Secondary | ICD-10-CM | POA: Diagnosis present

## 2014-05-11 DIAGNOSIS — Z3483 Encounter for supervision of other normal pregnancy, third trimester: Secondary | ICD-10-CM | POA: Diagnosis present

## 2014-05-11 DIAGNOSIS — Z3A40 40 weeks gestation of pregnancy: Secondary | ICD-10-CM | POA: Diagnosis present

## 2014-05-11 DIAGNOSIS — F17200 Nicotine dependence, unspecified, uncomplicated: Secondary | ICD-10-CM | POA: Diagnosis present

## 2014-05-11 DIAGNOSIS — O24420 Gestational diabetes mellitus in childbirth, diet controlled: Secondary | ICD-10-CM | POA: Diagnosis present

## 2014-05-11 DIAGNOSIS — O99824 Streptococcus B carrier state complicating childbirth: Secondary | ICD-10-CM | POA: Diagnosis present

## 2014-05-11 DIAGNOSIS — Z3490 Encounter for supervision of normal pregnancy, unspecified, unspecified trimester: Secondary | ICD-10-CM

## 2014-05-11 DIAGNOSIS — I10 Essential (primary) hypertension: Secondary | ICD-10-CM

## 2014-05-11 DIAGNOSIS — F1290 Cannabis use, unspecified, uncomplicated: Secondary | ICD-10-CM | POA: Diagnosis present

## 2014-05-11 DIAGNOSIS — O99334 Smoking (tobacco) complicating childbirth: Secondary | ICD-10-CM | POA: Diagnosis present

## 2014-05-11 DIAGNOSIS — O99820 Streptococcus B carrier state complicating pregnancy: Secondary | ICD-10-CM

## 2014-05-11 LAB — CBC
HEMATOCRIT: 40.8 % (ref 36.0–46.0)
Hemoglobin: 14.2 g/dL (ref 12.0–15.0)
MCH: 30.5 pg (ref 26.0–34.0)
MCHC: 34.8 g/dL (ref 30.0–36.0)
MCV: 87.7 fL (ref 78.0–100.0)
Platelets: 261 10*3/uL (ref 150–400)
RBC: 4.65 MIL/uL (ref 3.87–5.11)
RDW: 13.5 % (ref 11.5–15.5)
WBC: 7.9 10*3/uL (ref 4.0–10.5)

## 2014-05-11 LAB — POCT URINALYSIS DIPSTICK
BILIRUBIN UA: NEGATIVE
Blood, UA: NEGATIVE
Glucose, UA: NEGATIVE
KETONES UA: NEGATIVE
LEUKOCYTES UA: NEGATIVE
Nitrite, UA: NEGATIVE
PH UA: 5
Protein, UA: NEGATIVE
SPEC GRAV UA: 1.01
Urobilinogen, UA: NEGATIVE

## 2014-05-11 LAB — TYPE AND SCREEN
ABO/RH(D): A POS
Antibody Screen: NEGATIVE

## 2014-05-11 MED ORDER — LACTATED RINGERS IV SOLN: 500.0000 mL | INTRAVENOUS | Status: DC | PRN

## 2014-05-11 MED ORDER — OXYCODONE-ACETAMINOPHEN 5-325 MG PO TABS: 1.0000 | ORAL_TABLET | ORAL | Status: DC | PRN

## 2014-05-11 MED ORDER — LACTATED RINGERS IV SOLN
INTRAVENOUS | Status: DC
  Administered 2014-05-11 – 2014-05-12 (×3): via INTRAVENOUS

## 2014-05-11 MED ORDER — ONDANSETRON HCL 4 MG/2ML IJ SOLN: 4.0000 mg | Freq: Four times a day (QID) | INTRAMUSCULAR | Status: DC | PRN

## 2014-05-11 MED ORDER — FLEET ENEMA 7-19 GM/118ML RE ENEM: 1.0000 | ENEMA | RECTAL | Status: DC | PRN

## 2014-05-11 MED ORDER — LIDOCAINE HCL (PF) 1 % IJ SOLN
30.0000 mL | INTRAMUSCULAR | Status: DC | PRN
  Filled 2014-05-11: qty 30

## 2014-05-11 MED ORDER — TERBUTALINE SULFATE 1 MG/ML IJ SOLN: 0.2500 mg | Freq: Once | INTRAMUSCULAR | Status: AC | PRN

## 2014-05-11 MED ORDER — PHENYLEPHRINE 40 MCG/ML (10ML) SYRINGE FOR IV PUSH (FOR BLOOD PRESSURE SUPPORT)
80.0000 ug | PREFILLED_SYRINGE | INTRAVENOUS | Status: DC | PRN
  Filled 2014-05-11: qty 2
  Filled 2014-05-11: qty 10

## 2014-05-11 MED ORDER — NALBUPHINE HCL 10 MG/ML IJ SOLN
10.0000 mg | Freq: Four times a day (QID) | INTRAMUSCULAR | Status: DC | PRN
  Administered 2014-05-12: 10 mg via INTRAMUSCULAR
  Filled 2014-05-11: qty 1

## 2014-05-11 MED ORDER — OXYCODONE-ACETAMINOPHEN 5-325 MG PO TABS: 2.0000 | ORAL_TABLET | ORAL | Status: DC | PRN

## 2014-05-11 MED ORDER — CITRIC ACID-SODIUM CITRATE 334-500 MG/5ML PO SOLN: 30.0000 mL | ORAL | Status: DC | PRN

## 2014-05-11 MED ORDER — EPHEDRINE 5 MG/ML INJ
10.0000 mg | INTRAVENOUS | Status: DC | PRN
  Filled 2014-05-11: qty 2

## 2014-05-11 MED ORDER — NICOTINE 21 MG/24HR TD PT24
21.0000 mg | MEDICATED_PATCH | TRANSDERMAL | Status: DC
  Administered 2014-05-11: 21 mg via TRANSDERMAL
  Filled 2014-05-11 (×2): qty 1

## 2014-05-11 MED ORDER — LACTATED RINGERS IV SOLN
500.0000 mL | Freq: Once | INTRAVENOUS | Status: AC
  Administered 2014-05-12: 500 mL via INTRAVENOUS

## 2014-05-11 MED ORDER — OXYTOCIN BOLUS FROM INFUSION: 500.0000 mL | INTRAVENOUS | Status: DC

## 2014-05-11 MED ORDER — PROMETHAZINE HCL 25 MG/ML IJ SOLN
25.0000 mg | Freq: Four times a day (QID) | INTRAMUSCULAR | Status: DC | PRN
  Administered 2014-05-12: 25 mg via INTRAVENOUS
  Filled 2014-05-11: qty 1

## 2014-05-11 MED ORDER — NALBUPHINE HCL 10 MG/ML IJ SOLN
10.0000 mg | INTRAMUSCULAR | Status: DC | PRN
  Administered 2014-05-12: 10 mg via INTRAVENOUS
  Filled 2014-05-11: qty 1

## 2014-05-11 MED ORDER — OXYTOCIN 40 UNITS IN LACTATED RINGERS INFUSION - SIMPLE MED
62.5000 mL/h | INTRAVENOUS | Status: DC
Start: 2014-05-11 — End: 2014-05-12

## 2014-05-11 MED ORDER — DIPHENHYDRAMINE HCL 50 MG/ML IJ SOLN: 12.5000 mg | INTRAMUSCULAR | Status: DC | PRN

## 2014-05-11 MED ORDER — MISOPROSTOL 25 MCG QUARTER TABLET
25.0000 ug | ORAL_TABLET | ORAL | Status: DC | PRN
  Administered 2014-05-11: 25 ug via VAGINAL
  Filled 2014-05-11 (×2): qty 1

## 2014-05-11 MED ORDER — ACETAMINOPHEN 325 MG PO TABS: 650.0000 mg | ORAL_TABLET | ORAL | Status: DC | PRN

## 2014-05-11 MED ORDER — PHENYLEPHRINE 40 MCG/ML (10ML) SYRINGE FOR IV PUSH (FOR BLOOD PRESSURE SUPPORT)
80.0000 ug | PREFILLED_SYRINGE | INTRAVENOUS | Status: DC | PRN
  Filled 2014-05-11: qty 2

## 2014-05-11 MED ORDER — FENTANYL 2.5 MCG/ML BUPIVACAINE 1/10 % EPIDURAL INFUSION (WH - ANES)
14.0000 mL/h | INTRAMUSCULAR | Status: DC | PRN
  Administered 2014-05-12: 14 mL/h via EPIDURAL
  Filled 2014-05-11: qty 125

## 2014-05-11 NOTE — Progress Notes (Signed)
Subjective:    Michelle Houston is a 33 y.o. female being seen today for her obstetrical visit. She is at 2721w1d gestation. Patient reports no complaints. Fetal movement: normal.  Problem List Items Addressed This Visit    Supervision of other normal pregnancy - Primary   Relevant Orders      POCT urinalysis dipstick     Patient Active Problem List   Diagnosis Date Noted  . GBS (group B Streptococcus carrier), +RV culture, currently pregnant 04/21/2014  . Pre-existing essential hypertension affecting pregnancy in third trimester   . Pre-existing diabetes mellitus affecting pregnancy in third trimester, antepartum   . GERD without esophagitis 03/05/2014  . Supervision of other normal pregnancy 10/13/2013  . Chronic hypertension 10/13/2013    Objective:    BP 139/97 mmHg  Pulse 112  Temp(Src) 98.1 F (36.7 C)  Wt 204 lb (92.534 kg)  LMP 08/03/2013 FHT:  150 BPM  Uterine Size: size equals dates  Presentation: cephalic  Pelvic Exam: Deferred   NST:  Reactive  Assessment:    Pregnancy @ 3021w1d  weeks    PIH  Plan:     [facility]

## 2014-05-11 NOTE — H&P (Addendum)
Michelle Houston is a 33 y.o. female presenting for IOL for elevated BP.. Maternal Medical History:  Fetal activity: Perceived fetal activity is normal.   Last perceived fetal movement was within the past hour.    Prenatal complications: PIH.   Prenatal Complications - Diabetes: type 2. Diabetes is managed by diet.      OB History    Gravida Para Term Preterm AB TAB SAB Ectopic Multiple Living   4 2 2  1  1   2      Past Medical History  Diagnosis Date  . Hypertension   . Diabetes mellitus   . PCOS (polycystic ovarian syndrome)   . Bronchitis    Past Surgical History  Procedure Laterality Date  . Carpal tunnel release    . Cholecystectomy    . Bladder repair     Family History: family history includes Cancer in her father and maternal grandmother; Diabetes in her paternal aunt and paternal grandmother; Hypertension in her mother; Stroke in her maternal grandfather and paternal grandmother. Social History:  reports that she has been smoking Cigarettes.  She has a 18 pack-year smoking history. She has never used smokeless tobacco. She reports that she uses illicit drugs (Marijuana). She reports that she does not drink alcohol.   Prenatal Transfer Tool  Maternal Diabetes: Yes:  Diabetes Type:  Pre-pregnancy, Diet controlled Genetic Screening: Normal Maternal Ultrasounds/Referrals: Normal Fetal Ultrasounds or other Referrals:  None Maternal Substance Abuse:  yes Significant Maternal Medications:  None Significant Maternal Lab Results:  Lab values include: Group B Strep positive Other Comments:  None  Review of Systems  All other systems reviewed and are negative.   Dilation: 1 Effacement (%): Thick Station: -3 Exam by:: A.Davis,RN Blood pressure 134/72, pulse 93, temperature 97.9 F (36.6 C), temperature source Oral, resp. rate 18, height 5' 1.5" (1.562 m), weight 204 lb (92.534 kg), last menstrual period 08/03/2013, unknown if currently breastfeeding. Maternal Exam:   Abdomen: Patient reports no abdominal tenderness. Fetal presentation: vertex  Pelvis: adequate for delivery.   Cervix: Cervix evaluated by digital exam.     Physical Exam  Nursing note and vitals reviewed. Constitutional: She is oriented to person, place, and time. She appears well-developed and well-nourished.  HENT:  Head: Normocephalic and atraumatic.  Eyes: Conjunctivae are normal. Pupils are equal, round, and reactive to light.  Neck: Normal range of motion.  Cardiovascular: Normal rate and regular rhythm.   Respiratory: Effort normal.  GI: Soft.  Musculoskeletal: Normal range of motion.  Neurological: She is alert and oriented to person, place, and time.  Skin: Skin is warm and dry.  Psychiatric: She has a normal mood and affect. Her behavior is normal. Judgment and thought content normal.    Prenatal labs: ABO, Rh: --/--/A POS (12/22 1825) Antibody: NEG (12/22 1825) Rubella: 1.06 (05/26 1356) RPR: NON REAC (09/21 1114)  HBsAg: NEGATIVE (05/26 1356)  HIV: NONREACTIVE (09/21 1114)  GBS: Detected (11/19 1646)   Assessment/Plan: 40 weeks.  Hypertension.  2 stage IOL.   Michelle Houston A 05/11/2014, 11:18 PM

## 2014-05-12 ENCOUNTER — Encounter (HOSPITAL_COMMUNITY): Payer: Self-pay | Admitting: *Deleted

## 2014-05-12 ENCOUNTER — Inpatient Hospital Stay (HOSPITAL_COMMUNITY): Payer: Medicaid Other | Admitting: Anesthesiology

## 2014-05-12 LAB — RPR

## 2014-05-12 LAB — CBC
HCT: 41.5 % (ref 36.0–46.0)
HEMATOCRIT: 40.8 % (ref 36.0–46.0)
Hemoglobin: 13.9 g/dL (ref 12.0–15.0)
Hemoglobin: 14 g/dL (ref 12.0–15.0)
MCH: 30.2 pg (ref 26.0–34.0)
MCH: 29.9 pg (ref 26.0–34.0)
MCHC: 34.1 g/dL (ref 30.0–36.0)
MCHC: 33.7 g/dL (ref 30.0–36.0)
MCV: 88.5 fL (ref 78.0–100.0)
MCV: 88.7 fL (ref 78.0–100.0)
PLATELETS: 240 10*3/uL (ref 150–400)
Platelets: 231 10*3/uL (ref 150–400)
RBC: 4.61 MIL/uL (ref 3.87–5.11)
RBC: 4.68 MIL/uL (ref 3.87–5.11)
RDW: 13.6 % (ref 11.5–15.5)
RDW: 13.5 % (ref 11.5–15.5)
WBC: 6.3 10*3/uL (ref 4.0–10.5)
WBC: 7.4 10*3/uL (ref 4.0–10.5)

## 2014-05-12 MED ORDER — OXYCODONE-ACETAMINOPHEN 5-325 MG PO TABS
1.0000 | ORAL_TABLET | ORAL | Status: DC | PRN
  Administered 2014-05-12 – 2014-05-13 (×2): 1 via ORAL
  Filled 2014-05-12 (×2): qty 1

## 2014-05-12 MED ORDER — ONDANSETRON HCL 4 MG PO TABS: 4.0000 mg | ORAL_TABLET | ORAL | Status: DC | PRN

## 2014-05-12 MED ORDER — ZOLPIDEM TARTRATE 5 MG PO TABS: 5.0000 mg | ORAL_TABLET | Freq: Every evening | ORAL | Status: DC | PRN

## 2014-05-12 MED ORDER — LIDOCAINE HCL (PF) 1 % IJ SOLN
INTRAMUSCULAR | Status: DC | PRN
  Administered 2014-05-12 (×2): 5 mL

## 2014-05-12 MED ORDER — FERROUS SULFATE 325 (65 FE) MG PO TABS
325.0000 mg | ORAL_TABLET | Freq: Two times a day (BID) | ORAL | Status: DC
  Administered 2014-05-13: 325 mg via ORAL
  Filled 2014-05-12: qty 1

## 2014-05-12 MED ORDER — IBUPROFEN 600 MG PO TABS
600.0000 mg | ORAL_TABLET | Freq: Four times a day (QID) | ORAL | Status: DC
Start: 2014-05-12 — End: 2014-05-13
  Administered 2014-05-12 – 2014-05-13 (×4): 600 mg via ORAL
  Filled 2014-05-12 (×4): qty 1

## 2014-05-12 MED ORDER — MAGNESIUM HYDROXIDE 400 MG/5ML PO SUSP: 30.0000 mL | ORAL | Status: DC | PRN

## 2014-05-12 MED ORDER — DIPHENHYDRAMINE HCL 25 MG PO CAPS: 25.0000 mg | ORAL_CAPSULE | Freq: Four times a day (QID) | ORAL | Status: DC | PRN

## 2014-05-12 MED ORDER — TERBUTALINE SULFATE 1 MG/ML IJ SOLN: 0.2500 mg | Freq: Once | INTRAMUSCULAR | Status: DC | PRN

## 2014-05-12 MED ORDER — PENICILLIN G POTASSIUM 5000000 UNITS IJ SOLR
5.0000 10*6.[IU] | Freq: Once | INTRAVENOUS | Status: AC
  Administered 2014-05-12: 5 10*6.[IU] via INTRAVENOUS
  Filled 2014-05-12: qty 5

## 2014-05-12 MED ORDER — TETANUS-DIPHTH-ACELL PERTUSSIS 5-2.5-18.5 LF-MCG/0.5 IM SUSP: 0.5000 mL | Freq: Once | INTRAMUSCULAR | Status: DC

## 2014-05-12 MED ORDER — SENNOSIDES-DOCUSATE SODIUM 8.6-50 MG PO TABS
2.0000 | ORAL_TABLET | ORAL | Status: DC
  Filled 2014-05-12: qty 2

## 2014-05-12 MED ORDER — WITCH HAZEL-GLYCERIN EX PADS: 1.0000 "application " | MEDICATED_PAD | CUTANEOUS | Status: DC | PRN

## 2014-05-12 MED ORDER — ONDANSETRON HCL 4 MG/2ML IJ SOLN: 4.0000 mg | INTRAMUSCULAR | Status: DC | PRN

## 2014-05-12 MED ORDER — PENICILLIN G POTASSIUM 5000000 UNITS IJ SOLR
2.5000 10*6.[IU] | INTRAMUSCULAR | Status: DC
Start: 2014-05-12 — End: 2014-05-12
  Administered 2014-05-12: 2.5 10*6.[IU] via INTRAVENOUS
  Filled 2014-05-12 (×5): qty 2.5

## 2014-05-12 MED ORDER — MEASLES, MUMPS & RUBELLA VAC ~~LOC~~ INJ: 0.5000 mL | INJECTION | Freq: Once | SUBCUTANEOUS | Status: DC

## 2014-05-12 MED ORDER — BENZOCAINE-MENTHOL 20-0.5 % EX AERO: 1.0000 "application " | INHALATION_SPRAY | CUTANEOUS | Status: DC | PRN

## 2014-05-12 MED ORDER — OXYTOCIN 40 UNITS IN LACTATED RINGERS INFUSION - SIMPLE MED
1.0000 m[IU]/min | INTRAVENOUS | Status: DC
  Administered 2014-05-12: 1 m[IU]/min via INTRAVENOUS
  Filled 2014-05-12: qty 1000

## 2014-05-12 MED ORDER — OXYCODONE-ACETAMINOPHEN 5-325 MG PO TABS: 2.0000 | ORAL_TABLET | ORAL | Status: DC | PRN

## 2014-05-12 MED ORDER — PRENATAL MULTIVITAMIN CH
1.0000 | ORAL_TABLET | Freq: Every day | ORAL | Status: DC
  Administered 2014-05-13: 1 via ORAL
  Filled 2014-05-12: qty 1

## 2014-05-12 MED ORDER — DIBUCAINE 1 % RE OINT: 1.0000 "application " | TOPICAL_OINTMENT | RECTAL | Status: DC | PRN

## 2014-05-12 MED ORDER — LANOLIN HYDROUS EX OINT: TOPICAL_OINTMENT | CUTANEOUS | Status: DC | PRN

## 2014-05-12 NOTE — Progress Notes (Signed)
Michelle Houston is a 33 y.o. [id number] at 4024w2d by LMP admitted for induction of labor due to Hypertension.  Subjective:   Objective: BP 130/93 mmHg  Pulse 97  Temp(Src) 97.8 F (36.6 C) (Oral)  Resp 18  Ht 5' 1.5" (1.562 m)  Wt 204 lb (92.534 kg)  BMI 37.93 kg/m2  LMP 08/03/2013      FHT:  FHR: 135 bpm, variability: moderate,  accelerations:  Present,  decelerations:  Absent UC:   regular, every 2-5 minutes SVE:   Dilation: 3.5 Effacement (%): 70 Station: -1, -2 Exam by:: hk  Labs: Lab Results  Component Value Date   WBC 6.3 05/12/2014   HGB 13.9 05/12/2014   HCT 40.8 05/12/2014   MCV 88.5 05/12/2014   PLT 231 05/12/2014    Assessment / Plan: Induction of labor due to hypertension,  progressing well on pitocin  Labor: Progressing normally Preeclampsia:  stable Fetal Wellbeing:  Category I Pain Control:  Labor support without medications I/D:  n/a Anticipated MOD:  NSVD  Aiysha Jillson A 05/12/2014, 9:49 AM

## 2014-05-12 NOTE — Anesthesia Preprocedure Evaluation (Signed)
Anesthesia Evaluation  Patient identified by MRN, date of birth, ID band Patient awake    Reviewed: Allergy & Precautions, H&P , Patient's Chart, lab work & pertinent test results  Airway Mallampati: III  TM Distance: >3 FB Neck ROM: full    Dental   Pulmonary Current Smoker,  breath sounds clear to auscultation        Cardiovascular hypertension, Rhythm:regular Rate:Normal     Neuro/Psych    GI/Hepatic GERD-  ,  Endo/Other  diabetesMorbid obesity  Renal/GU      Musculoskeletal   Abdominal   Peds  Hematology   Anesthesia Other Findings   Reproductive/Obstetrics (+) Pregnancy                             Anesthesia Physical Anesthesia Plan  ASA: III  Anesthesia Plan: Epidural   Post-op Pain Management:    Induction:   Airway Management Planned:   Additional Equipment:   Intra-op Plan:   Post-operative Plan:   Informed Consent: I have reviewed the patients History and Physical, chart, labs and discussed the procedure including the risks, benefits and alternatives for the proposed anesthesia with the patient or authorized representative who has indicated his/her understanding and acceptance.     Plan Discussed with:   Anesthesia Plan Comments:         Anesthesia Quick Evaluation

## 2014-05-12 NOTE — Anesthesia Procedure Notes (Signed)
Epidural Patient location during procedure: OB Start time: 05/12/2014 11:37 AM  Staffing Anesthesiologist: Brayton CavesJACKSON, Vita Currin Performed by: anesthesiologist   Preanesthetic Checklist Completed: patient identified, site marked, surgical consent, pre-op evaluation, timeout performed, IV checked, risks and benefits discussed and monitors and equipment checked  Epidural Patient position: sitting Prep: site prepped and draped and DuraPrep Patient monitoring: continuous pulse ox and blood pressure Approach: midline Location: L3-L4 Injection technique: LOR air  Needle:  Needle type: Tuohy  Needle gauge: 17 G Needle length: 9 cm and 9 Needle insertion depth: 8 cm Catheter type: closed end flexible Catheter size: 19 Gauge Catheter at skin depth: 14 cm Test dose: negative  Assessment Events: blood not aspirated, injection not painful, no injection resistance, negative IV test and no paresthesia  Additional Notes Patient identified.  Risk benefits discussed including failed block, incomplete pain control, headache, nerve damage, paralysis, blood pressure changes, nausea, vomiting, reactions to medication both toxic or allergic, and postpartum back pain.  Patient expressed understanding and wished to proceed.  All questions were answered.  Sterile technique used throughout procedure and epidural site dressed with sterile barrier dressing. No paresthesia or other complications noted.The patient did not experience any signs of intravascular injection such as tinnitus or metallic taste in mouth nor signs of intrathecal spread such as rapid motor block. Please see nursing notes for vital signs.

## 2014-05-13 LAB — CBC
HCT: 38.9 % (ref 36.0–46.0)
Hemoglobin: 13.1 g/dL (ref 12.0–15.0)
MCH: 29.8 pg (ref 26.0–34.0)
MCHC: 33.7 g/dL (ref 30.0–36.0)
MCV: 88.6 fL (ref 78.0–100.0)
Platelets: 243 10*3/uL (ref 150–400)
RBC: 4.39 MIL/uL (ref 3.87–5.11)
RDW: 13.5 % (ref 11.5–15.5)
WBC: 7.6 10*3/uL (ref 4.0–10.5)

## 2014-05-13 MED ORDER — IBUPROFEN 600 MG PO TABS: 600.0000 mg | ORAL_TABLET | Freq: Four times a day (QID) | ORAL | Status: DC | PRN

## 2014-05-13 MED ORDER — OXYCODONE-ACETAMINOPHEN 5-325 MG PO TABS: 1.0000 | ORAL_TABLET | ORAL | Status: DC | PRN

## 2014-05-13 NOTE — Lactation Note (Signed)
This note was copied from the chart of Michelle Houston. Lactation Consultation Note Experienced BF mom for 3 months her last child. Mom has dx: of PCOS on chart, mom states she didn't believe she has it. Mom LARGE pendulum shaped breast, lots of breast tissue. Hand expression taught with noted colostrum. Encouraged to rub on sore nipples. Comfort gels given. Mom wants an early discharge. Hand pump given to post-pump after feeding to stimulate breast to build milk supply d/t PCOS. Mom has good everted compressible nipples. Discussed positioning, deep latching, monitoring I&O. Mom had hx: of MJ during pregnancy. Discussed not smoking MJ while BF that it will transfer to baby through breast milk. Discussed supply and demand and engorgement. Mom encouraged to feed baby 8-12 times/24 hours and with feeding cues. Mom encouraged to waken baby for feeds. Mom encouraged to do skin-to-skin. Educated about newborn behavior. Encouraged to call for assistance if needed and to verify proper latch.Mom reports + breast changes w/pregnancy. Referred to Baby and Me Book in Breastfeeding section Pg. 22-23 for position options and Proper latch demonstration. WH/LC brochure given w/resources, support groups and LC services. Patient Name: Michelle Houston Today's Date: 05/13/2014 Reason for consult: Initial assessment   Maternal Data Has patient been taught Hand Expression?: Yes Does the patient have breastfeeding experience prior to this delivery?: Yes  Feeding    LATCH Score/Interventions       Type of Nipple: Everted at rest and after stimulation  Comfort (Breast/Nipple): Filling, red/small blisters or bruises, mild/mod discomfort  Problem noted: Mild/Moderate discomfort Interventions (Mild/moderate discomfort): Comfort gels;Post-pump;Hand expression;Hand massage        Lactation Tools Discussed/Used Tools: Pump;Comfort gels Breast pump type: Manual Pump Review: Setup, frequency, and  cleaning;Milk Storage Initiated by:: Peri JeffersonL. Zelma Snead RN Date initiated:: 05/13/14   Consult Status Consult Status: Complete    Purnell Daigle G 05/13/2014, 9:59 AM

## 2014-05-13 NOTE — Progress Notes (Signed)
Post Partum Day 1 Subjective: no complaints  Objective: Blood pressure 124/75, pulse 89, temperature 98.4 F (36.9 C), temperature source Oral, resp. rate 18, height 5' 1.5" (1.562 m), weight 204 lb (92.534 kg), last menstrual period 08/03/2013, SpO2 98 %, unknown if currently breastfeeding.  Physical Exam:  General: alert and no distress Lochia: appropriate Uterine Fundus: firm Incision: none DVT Evaluation: No evidence of DVT seen on physical exam.   Recent Labs  05/12/14 1614 05/13/14 0700  HGB 14.0 13.1  HCT 41.5 38.9    Assessment/Plan: Discharge home   LOS: 2 days   Miranda Garber A 05/13/2014, 9:28 AM

## 2014-05-13 NOTE — Anesthesia Postprocedure Evaluation (Signed)
  Anesthesia Post-op Note  Patient: Michelle Houston  Procedure(s) Performed: * No procedures listed *  Patient Location: Mother/Baby  Anesthesia Type:Epidural  Level of Consciousness: awake, alert , oriented and patient cooperative  Airway and Oxygen Therapy: Patient Spontanous Breathing  Post-op Pain: mild  Post-op Assessment: Post-op Vital signs reviewed, Patient's Cardiovascular Status Stable, Respiratory Function Stable, Patent Airway, No headache, No backache, No residual numbness and No residual motor weakness  Post-op Vital Signs: Reviewed and stable  Last Vitals:  Filed Vitals:   05/13/14 0430  BP: 124/75  Pulse: 89  Temp: 36.9 C  Resp: 18    Complications: No apparent anesthesia complications

## 2014-05-13 NOTE — Progress Notes (Signed)
Clinical Social Work Department PSYCHOSOCIAL ASSESSMENT - MATERNAL/CHILD 05/13/2014  Patient:  Michelle Houston,Michelle Houston  Account Number:  [id number]  Admit Date:  05/11/2014  Childs Name:   Michelle Houston   Clinical Social Worker:  Savan Ruta, CLINICAL SOCIAL WORKER   Date/Time:  05/13/2014 10:15 AM  Date Referred:  05/12/2014   Referral source  Central Nursery     Referred reason  Substance Abuse   Other referral source:    I:  FAMILY / HOME ENVIRONMENT Child's legal guardian:  PARENT  Guardian - Name Guardian - Age Guardian - Address  Michelle Houston 33 [address] [place], [place] [address]  Michelle Houston  currently incarcerated in [place]   Other household support members/support persons Name Relationship DOB   DAUGHTER 18 years old   DAUGHTER 14 years old   Other support:   MOB reported that her mother and her sister are suportive. She discussed that her mother will be helping her in the postpartum period.    II  PSYCHOSOCIAL DATA Information Source:  Patient Interview  Financial and Community Resources Employment:   MOB stated that she is currently on FMLA.  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   Financial resources:  Medicaid If Medicaid - County:  [place]  School / Grade:  N/A Maternity Care Coordinator / Child Services Coordination / Early Interventions:   none reported  Cultural issues impacting care:   none reported   III  STRENGTHS Strengths  Adequate Resources  Home prepared for Child (including basic supplies)  Supportive family/friends   Strength comment:    IV  RISK FACTORS AND CURRENT PROBLEMS Current Problem:  YES   Risk Factor & Current Problem Patient Issue Family Issue Risk Factor / Current Problem Comment  Substance Abuse Y N MOB presents with THC use during pregnancy.  Baby's UDS is negative and MDS is pending.    V  SOCIAL WORK ASSESSMENT CSW met with the MOB due to maternal use of THC during pregnancy.  MOB  presented as closed, guarded, and difficult to engage.  She was willing to meet with CSW, but she was hyper-focused on wanting to return home and stress secondary to being in the hospital.  As CSW validated her feelings and provided supportive listening, she became less frustrated and less irritable, and became more receptive to completing assessment.  MOB was observed to be interacting and bonding with the baby, was observed to be smiling when attending to her needs.   MOB presented as frustrated upon CSW arrival since the MOB reported that she had been wanting "fresh air" while the baby was taking photos with Santa.  She stated that she was unable to get outside since the baby's photos were completed prior to her ability to get outside.  MOB expressed desire to have a "break", and shared that it has been difficult for her to have a break since her family has not been able to come to the hospital to support since they are helping with her other children and are at home since she has a visit from the cable company.  She discussed that she is "so stressed out" due to being at the hospital.  She continued to discuss stress associated with frequent visitors, including the baby constantly being woken up which results in the baby crying. Per MOB, she believes her stress level would be greatly reduced if she were able to return home since she would be in her own environment, there would   be less interruptions from hospital staff, and she would have her mother there to be supportive. MOB verbalized awareness that the stress is short term since she will not remain in the hospital forever, but presented as minimally receptive to exploring how to reduce stress in the present moment.   MOB confirmed that she lives at home with her other daughters (ages 14 and 18).  She did not express any stress secondary to adjusting to having a newborn again. Per MOB, she was excited when she learned that she was pregnant since she did  not think she would be able to get pregnant again.  MOB did express sadness secondary to the FOB's inability to be present for the baby's birth.  Per MOB, he was incarcerated one month ago due to "old business from [place]".  She did not clarify on his charges, but stated that it continues to be unknown how long he will be away.  She stated that she spoke to him yesterday on the phone which she did enjoy.    MOB denied mental health history and denied history of postpartum depression.   MOB denied concerns about developing postpartum depression, but did acknowledge education that was provided by CSW.   MOB acknowledged THC use during pregnancy.  She did not clarify exact use and stated "I was never using that much".  She reported last 2-3 months ago, and acknowledged CPS report that will be made if MDS is positive.  She denied concerns since "they will not take my baby away for marijuana use".  MOB denied prior history of CPS.   No barriers to discharge.   VI SOCIAL WORK PLAN Social Work Plan  Patient/Family Education  No Further Intervention Required / No Barriers to Discharge   Type of pt/family education:   Postpartum depression  Hospital drug screen policy   If child protective services report - county:  N/A If child protective services report - date:  N/A Information/referral to community resources comment:   No referrals at this time.   Other social work plan:   CSW to monitor MDS and will make CPS report if positive.     

## 2014-05-13 NOTE — Discharge Summary (Signed)
Physician Discharge Summary  Patient ID: Michelle Houston MRN: [id number] DOB/AGE: April 05, 1981 33 y.o.  Admit date: 05/11/2014 Discharge date: 05/13/2014  Admission Diagnoses:  40 weeks.  Hypertension.  Discharge Diagnoses:  Same.  Delivered. Active Problems:   Pregnancy   Normal delivery   Discharged Condition: good   Hospital Course: Admitted for 2 stage IOL for HTN.  Progressed to NSVD viable infant on 05-12-14 without complications.  Postpartum course uncomplicated.  Discharged home in good condition.  Consults: None  Significant Diagnostic Studies: labs: CBC, CMET  Treatments: analgesia: Nubain, epidural  Discharge Exam: Blood pressure 124/75, pulse 89, temperature 98.4 F (36.9 C), temperature source Oral, resp. rate 18, height 5' 1.5" (1.562 m), weight 204 lb (92.534 kg), last menstrual period 08/03/2013, SpO2 98 %, unknown if currently breastfeeding. General appearance: alert and no distress GI: normal findings: soft, non-tender and uterus firm Extremities: extremities normal, atraumatic, no cyanosis or edema  Disposition: 01-Home or Self Care  Discharge Instructions    Diet - low sodium heart healthy    Complete by:  As directed             Medication List    STOP taking these medications        acetaminophen 500 MG tablet  Commonly known as:  TYLENOL      TAKE these medications        ibuprofen 600 MG tablet  Commonly known as:  ADVIL,MOTRIN  Take 1 tablet (600 mg total) by mouth every 6 (six) hours as needed for mild pain.     oxyCODONE-acetaminophen 5-325 MG per tablet  Commonly known as:  PERCOCET/ROXICET  Take 1-2 tablets by mouth every 4 (four) hours as needed for moderate pain or severe pain (for pain scale equal to or greater than 7).     prenatal multivitamin Tabs tablet  Take 1 tablet by mouth daily at 12 noon.           Follow-up Information    Follow up with HARPER,CHARLES A, MD In 2 weeks.   Specialty:  Obstetrics and  Gynecology   Contact information:   [address] [place] [address] [phone]       Signed: Brock BadHARPER,CHARLES A 05/13/2014, 12:14 PM

## 2014-05-13 NOTE — Progress Notes (Signed)
UR chart review completed.  

## 2014-05-17 ENCOUNTER — Encounter: Payer: Self-pay | Admitting: *Deleted

## 2014-05-18 ENCOUNTER — Encounter: Payer: Self-pay | Admitting: Obstetrics & Gynecology

## 2014-06-25 ENCOUNTER — Ambulatory Visit (INDEPENDENT_AMBULATORY_CARE_PROVIDER_SITE_OTHER): Payer: Medicaid Other | Admitting: Obstetrics

## 2014-06-25 ENCOUNTER — Encounter: Payer: Self-pay | Admitting: Obstetrics

## 2014-06-25 DIAGNOSIS — I10 Essential (primary) hypertension: Secondary | ICD-10-CM

## 2014-06-25 DIAGNOSIS — Z30014 Encounter for initial prescription of intrauterine contraceptive device: Secondary | ICD-10-CM

## 2014-06-25 NOTE — Progress Notes (Signed)
Subjective:     Michelle Houston is a 34 y.o. female who presents for a postpartum visit. She is 6 weeks postpartum following a spontaneous vaginal delivery. I have fully reviewed the prenatal and intrapartum course. The delivery was at 41 gestational weeks. Outcome: spontaneous vaginal delivery. Anesthesia: epidural. Postpartum course has been normal. Baby's course has been normal. Baby is feeding by bottle - Similac Advance. Bleeding no bleeding. Bowel function is normal. Bladder function is normal. Patient is not sexually active. Contraception method is abstinence. Postpartum depression screening: negative.  Tobacco, alcohol and substance abuse history reviewed.  Adult immunizations reviewed including TDAP, rubella and varicella.  The following portions of the patient's history were reviewed and updated as appropriate: allergies, current medications, past family history, past medical history, past social history, past surgical history and problem list.  Review of Systems A comprehensive review of systems was negative.   Objective:    BP 139/94 mmHg  Pulse 75  Temp(Src) 97 F (36.1 C)  Ht 5\' 2"  (1.575 m)  Wt 202 lb (91.627 kg)  BMI 36.94 kg/m2  Breastfeeding? No  General:  alert and no distress   Breasts:  inspection negative, no nipple discharge or bleeding, no masses or nodularity palpable  Lungs: clear to auscultation bilaterally  Heart:  regular rate and rhythm, S1, S2 normal, no murmur, click, rub or gallop  Abdomen: normal findings: soft, non-tender   Vulva:  normal  Vagina: normal vagina  Cervix:  no cervical motion tenderness  Corpus: normal size, contour, position, consistency, mobility, non-tender  Adnexa:  no mass, fullness, tenderness  Rectal Exam: Not performed.           Assessment:     Bormal postpartum exam. Pap smear not done at today's visit.   Contraceptive counseling.  Wants IUD. Plan:    1. Contraception: IUD 2. Continue PNV's 3. Follow up in: several  months or as needed.   Healthy lifestyle practices reviewed

## 2014-06-30 ENCOUNTER — Telehealth: Payer: Self-pay | Admitting: *Deleted

## 2014-06-30 NOTE — Telephone Encounter (Signed)
Patient is interested in the Mirena IUD for contraception. Patient has not resumed intercourse since delivery. Per Dr. Clearance CootsHarper schedule 4 weeks from her last appointment. Patient has been scheduled for July 27, 2014 @ 3:30 pm. Patient advised not resume intercourse until after insertion. Patient verbalized understanding.

## 2014-07-27 ENCOUNTER — Ambulatory Visit: Payer: Self-pay | Admitting: Obstetrics

## 2014-10-27 ENCOUNTER — Ambulatory Visit: Payer: Medicaid Other | Admitting: Certified Nurse Midwife

## 2014-11-17 ENCOUNTER — Ambulatory Visit: Payer: Self-pay | Admitting: Surgery

## 2015-07-05 ENCOUNTER — Emergency Department (HOSPITAL_COMMUNITY)
Admission: EM | Admit: 2015-07-05 | Discharge: 2015-07-05 | Disposition: A | Discharge status: Home / Self Care | Payer: Medicaid Other

## 2015-07-05 NOTE — ED Notes (Signed)
No answer x3

## 2015-08-07 ENCOUNTER — Emergency Department (HOSPITAL_BASED_OUTPATIENT_CLINIC_OR_DEPARTMENT_OTHER)
Admission: EM | Admit: 2015-08-07 | Discharge: 2015-08-07 | Disposition: A | Discharge status: Home / Self Care | Payer: Medicaid Other | Attending: Emergency Medicine | Admitting: Emergency Medicine

## 2015-08-07 ENCOUNTER — Encounter (HOSPITAL_BASED_OUTPATIENT_CLINIC_OR_DEPARTMENT_OTHER): Payer: Self-pay

## 2015-08-07 DIAGNOSIS — Z8709 Personal history of other diseases of the respiratory system: Secondary | ICD-10-CM | POA: Diagnosis not present

## 2015-08-07 DIAGNOSIS — L299 Pruritus, unspecified: Secondary | ICD-10-CM | POA: Insufficient documentation

## 2015-08-07 DIAGNOSIS — Z79899 Other long term (current) drug therapy: Secondary | ICD-10-CM | POA: Insufficient documentation

## 2015-08-07 DIAGNOSIS — I10 Essential (primary) hypertension: Secondary | ICD-10-CM | POA: Diagnosis not present

## 2015-08-07 DIAGNOSIS — R21 Rash and other nonspecific skin eruption: Secondary | ICD-10-CM | POA: Diagnosis not present

## 2015-08-07 DIAGNOSIS — F17210 Nicotine dependence, cigarettes, uncomplicated: Secondary | ICD-10-CM | POA: Insufficient documentation

## 2015-08-07 DIAGNOSIS — E119 Type 2 diabetes mellitus without complications: Secondary | ICD-10-CM | POA: Diagnosis not present

## 2015-08-07 MED ORDER — MUPIROCIN 2 % EX OINT: 1.0000 "application " | TOPICAL_OINTMENT | Freq: Two times a day (BID) | CUTANEOUS | Status: DC

## 2015-08-07 NOTE — ED Provider Notes (Signed)
CSN: [id number]     Arrival date & time 08/07/15  1012 History   First MD Initiated Contact with Patient 08/07/15 1039     Chief Complaint  Patient presents with  . Rash     (Consider location/radiation/quality/duration/timing/severity/associated sxs/prior Treatment) HPI   Michelle Houston is a 35 y.o. female, with a history of DM and hypertension, presenting to the ED with a puritic rash on the right buttock, first noticed yesterday. Pt endorses pain with itching. Pt has tried some home remedies with no relief. She denies fever/chills, pain with bowel movements or dysuria, or any other complaints.    Past Medical History  Diagnosis Date  . Hypertension   . Diabetes mellitus   . PCOS (polycystic ovarian syndrome)   . Bronchitis    Past Surgical History  Procedure Laterality Date  . Carpal tunnel release    . Cholecystectomy    . Bladder repair     Family History  Problem Relation Age of Onset  . Hypertension Mother   . Diabetes Paternal Aunt   . Cancer Maternal Grandmother   . Stroke Maternal Grandfather   . Diabetes Paternal Grandmother   . Stroke Paternal Grandmother   . Cancer Father    Social History  Substance Use Topics  . Smoking status: Current Every Day Smoker -- 1.00 packs/day for 18 years    Types: Cigarettes  . Smokeless tobacco: Never Used  . Alcohol Use: No   OB History    Gravida Para Term Preterm AB TAB SAB Ectopic Multiple Living   4 3 3  1  1   0 3     Review of Systems  Constitutional: Negative for fever and chills.  Skin: Positive for rash.  Neurological: Negative for numbness.      Allergies  Review of patient's allergies indicates no known allergies.  Home Medications   Prior to Admission medications   Medication Sig Start Date End Date Taking? Authorizing Provider  losartan-hydrochlorothiazide (HYZAAR) 50-12.5 MG tablet Take 1 tablet by mouth daily.   Yes Historical Provider, MD  mupirocin ointment (BACTROBAN) 2 % Place 1  application into the nose 2 (two) times daily. 08/07/15   Jaeanna Mccomber C Xoey Warmoth, PA-C   BP 155/90 mmHg  Pulse 97  Temp(Src) 97.9 F (36.6 C) (Oral)  Resp 18  Ht 5\' 1"  (1.549 m)  Wt 104.327 kg  BMI 43.48 kg/m2  SpO2 100% Physical Exam  Constitutional: She appears well-developed and well-nourished. No distress.  HENT:  Head: Normocephalic and atraumatic.  Eyes: Conjunctivae are normal.  Cardiovascular: Normal rate and regular rhythm.   Pulmonary/Chest: Effort normal.  Neurological: She is alert.  Skin: Skin is warm and dry. She is not diaphoretic.  Area of individual white, clustered raised lesions with surrounding erythema located near the right gluteal fold. No discharge. No underlying fluctuance.   Nursing note and vitals reviewed.   ED Course  Procedures (including critical care time)   MDM   Final diagnoses:  Rash    Michelle Houston presents with a painful and sometimes periodic rash to the right buttock for the last 2 days.  Suspect impetigo. No history of MRSA. Mupirocin ointment for the next 5 days. Patient to follow up with PCP should symptoms continue. Return precautions discussed. Patient voiced understanding of these instructions and is comfortable with discharge.  Anselm PancoastShawn C Duru Reiger, PA-C 08/07/15 1653  Pricilla LovelessScott Goldston, MD 08/08/15 [phone]

## 2015-08-07 NOTE — Discharge Instructions (Signed)
You have been seen today for a rash. Apply the mupirocin ointment to the affected area twice a day for the next 5 days. Follow up with PCP should symptoms continue. Return to ED should symptoms worsen.

## 2015-08-07 NOTE — ED Notes (Signed)
Patient here with 2 days of raised rash to right buttock with itching, no drainage

## 2015-09-21 ENCOUNTER — Other Ambulatory Visit: Payer: Medicaid Other

## 2016-01-21 ENCOUNTER — Encounter (HOSPITAL_COMMUNITY): Payer: Self-pay | Admitting: Emergency Medicine

## 2016-01-21 ENCOUNTER — Emergency Department (HOSPITAL_COMMUNITY)
Admission: EM | Admit: 2016-01-21 | Discharge: 2016-01-21 | Disposition: A | Discharge status: Home / Self Care | Payer: Medicaid Other | Attending: Emergency Medicine | Admitting: Emergency Medicine

## 2016-01-21 DIAGNOSIS — I10 Essential (primary) hypertension: Secondary | ICD-10-CM | POA: Insufficient documentation

## 2016-01-21 DIAGNOSIS — R040 Epistaxis: Secondary | ICD-10-CM | POA: Insufficient documentation

## 2016-01-21 DIAGNOSIS — Z79899 Other long term (current) drug therapy: Secondary | ICD-10-CM | POA: Diagnosis not present

## 2016-01-21 DIAGNOSIS — E119 Type 2 diabetes mellitus without complications: Secondary | ICD-10-CM | POA: Diagnosis not present

## 2016-01-21 DIAGNOSIS — F17210 Nicotine dependence, cigarettes, uncomplicated: Secondary | ICD-10-CM | POA: Insufficient documentation

## 2016-01-21 MED ORDER — OXYMETAZOLINE HCL 0.05 % NA SOLN: 1.0000 | Freq: Two times a day (BID) | NASAL | 0 refills | Status: AC

## 2016-01-21 NOTE — Discharge Instructions (Signed)

## 2016-01-21 NOTE — ED Provider Notes (Signed)
[facility]EMER[facility]ENCY DEPT Provider Note   CSN: [id number] Arrival date & time: 01/21/16  0931     History   Chief Complaint Chief Complaint  Patient presents with  . Epistaxis    HPI Michelle Houston is a 35 y.o. female.  HPI  Pt with hx of HTN and DM comes in with epistaxis. She had an episode of bleed yday, and again this morning. She called EMS yday but decided to stay in the house when the bleeding stopped. Today she decided to come to the ER. There was large amount of bleeding per patient. It stopped en route to the ER. Pt  Is not sure what caused her to bleed, she has no hx of bleeding problems and her records dont show her being on blood thinners or aspirin. Pt reports that her BP was 190s SBP when EMS checked it yday.   Past Medical History:  Diagnosis Date  . Bronchitis   . Diabetes mellitus   . Hypertension   . PCOS (polycystic ovarian syndrome)     Patient Active Problem List   Diagnosis Date Noted  . Normal delivery 05/12/2014  . Pregnancy 05/11/2014  . GBS (group B Streptococcus carrier), +RV culture, currently pregnant 04/21/2014  . Pre-existing essential hypertension affecting pregnancy in third trimester   . Pre-existing diabetes mellitus affecting pregnancy in third trimester, antepartum   . GERD without esophagitis 03/05/2014  . Supervision of other normal pregnancy 10/13/2013  . Chronic hypertension 10/13/2013    Past Surgical History:  Procedure Laterality Date  . BLADDER REPAIR    . CARPAL TUNNEL RELEASE    . CHOLECYSTECTOMY      OB History    Gravida Para Term Preterm AB Living   4 3 3   1 3    SAB TAB Ectopic Multiple Live Births   1     0 3       Home Medications    Prior to Admission medications   Medication Sig Start Date End Date Taking? Authorizing Provider  losartan-hydrochlorothiazide (HYZAAR) 50-12.5 MG tablet Take 1 tablet by mouth daily.   Yes Historical Provider, MD  oxymetazoline (AFRIN NASAL SPRAY) 0.05 % nasal spray Place 1  spray into both nostrils 2 (two) times daily. 01/21/16 01/24/16  Derwood KaplanAnkit Jordany Russett, MD    Family History Family History  Problem Relation Age of Onset  . Cancer Maternal Grandmother   . Hypertension Mother   . Diabetes Paternal Aunt   . Stroke Maternal Grandfather   . Diabetes Paternal Grandmother   . Stroke Paternal Grandmother   . Cancer Father     Social History Social History  Substance Use Topics  . Smoking status: Current Every Day Smoker    Packs/day: 1.00    Years: 18.00    Types: Cigarettes  . Smokeless tobacco: Never Used  . Alcohol use 0.0 oz/week     Comment: occ     Allergies   Review of patient's allergies indicates no known allergies.   Review of Systems Review of Systems  ROS 10 Systems reviewed and are negative for acute change except as noted in the HPI.     Physical Exam Updated Vital Signs BP 125/78   Pulse 73   Temp 98.1 F (36.7 C) (Oral)   Resp 16   Ht 5' 1.5" (1.562 m)   Wt 230 lb (104.3 kg)   LMP 12/21/2015 (Exact Date)   SpO2 99%   BMI 42.75 kg/m   Physical Exam  Constitutional: She is  oriented to person, place, and time. She appears well-developed.  HENT:  Head: Normocephalic.  Pt's L nare shows no dry blood or active bleeding -essentially the focus of the bleed was not clear.  Eyes: EOM are normal.  Neck: Neck supple.  Cardiovascular: Normal rate.   Pulmonary/Chest: Effort normal.  Neurological: She is alert and oriented to person, place, and time.  Skin: Skin is warm.  Nursing note and vitals reviewed.    ED Treatments / Results  Labs (all labs ordered are listed, but only abnormal results are displayed) Labs Reviewed - No data to display  EKG  EKG Interpretation None       Radiology No results found.  Procedures Procedures (including critical care time)  Medications Ordered in ED Medications - No data to display   Initial Impression / Assessment and Plan / ED Course  I have reviewed the triage vital  signs and the nursing notes.  Pertinent labs & imaging results that were available during my care of the patient were reviewed by me and considered in my medical decision making (see chart for details).  Clinical Course    Pt with epistaxis, that stopped prior to me seeing her. Pt in the ER for 1 hour - no bleeds here.  She is not on anticoagulation, antiplatelet. Denies trauma We counseled her thoroughly on all the steps involved in care of epistaxis and how it is not uncommon for [place] to not visualize the source.  Strict ER return precautions have been discussed, and patient is agreeing with the plan and is comfortable with the workup done and the recommendations from the ER.   Final Clinical Impressions(s) / ED Diagnoses   Final diagnoses:  Epistaxis  Epistaxis, recurrent    New Prescriptions New Prescriptions   OXYMETAZOLINE (AFRIN NASAL SPRAY) 0.05 % NASAL SPRAY    Place 1 spray into both nostrils 2 (two) times daily.     Derwood Kaplan, MD 01/21/16 1101

## 2016-01-21 NOTE — ED Triage Notes (Signed)
Per [facility]: Patient to ED from home c/o R sided epistaxis x 20 minutes this morning after smoking 2 cigarettes. Patient also had 20 minute episode last night at 2100, EMS was called out, but bleed subsided. Per EMS, pt's BP last night was elevated 190/110 - hx HTN, but noncompliant with BP medication for 6 months. Patient does report taking a losartan/HCTZ this morning. Bleeding controlled at this time. EMS VS: 148/100, HR 100, RR 18, 98% RA, A&O x 4. Patient denies pain/dizziness/N/V.

## 2016-01-21 NOTE — ED Notes (Signed)
Patient seems upset that MD did not do anything for her nosebleed and she is going home, thinking she is "going to die from another nosebleed." RN assured her that her nosebleed has stopped and asked if she would like to speak with the MD again. Patient denies wanting to see MD and states she has no further questions or needs. Patient ambulatory with steady gait. A&O x 4.

## 2016-01-22 ENCOUNTER — Encounter (HOSPITAL_COMMUNITY): Payer: Self-pay | Admitting: *Deleted

## 2016-01-22 ENCOUNTER — Emergency Department (HOSPITAL_COMMUNITY)
Admission: EM | Admit: 2016-01-22 | Discharge: 2016-01-22 | Disposition: A | Discharge status: Home / Self Care | Payer: Medicaid Other | Attending: Emergency Medicine | Admitting: Emergency Medicine

## 2016-01-22 ENCOUNTER — Telehealth (HOSPITAL_COMMUNITY): Payer: Self-pay

## 2016-01-22 DIAGNOSIS — Z79899 Other long term (current) drug therapy: Secondary | ICD-10-CM

## 2016-01-22 DIAGNOSIS — I10 Essential (primary) hypertension: Secondary | ICD-10-CM | POA: Insufficient documentation

## 2016-01-22 DIAGNOSIS — R040 Epistaxis: Secondary | ICD-10-CM | POA: Insufficient documentation

## 2016-01-22 DIAGNOSIS — E119 Type 2 diabetes mellitus without complications: Secondary | ICD-10-CM | POA: Insufficient documentation

## 2016-01-22 DIAGNOSIS — R51 Headache: Secondary | ICD-10-CM

## 2016-01-22 DIAGNOSIS — F17210 Nicotine dependence, cigarettes, uncomplicated: Secondary | ICD-10-CM | POA: Insufficient documentation

## 2016-01-22 MED ORDER — ACETAMINOPHEN 500 MG PO TABS
1000.0000 mg | ORAL_TABLET | Freq: Once | ORAL | Status: AC
  Administered 2016-01-22: 1000 mg via ORAL
  Filled 2016-01-22: qty 2

## 2016-01-22 MED ORDER — CLONIDINE HCL 0.1 MG PO TABS: 0.1000 mg | ORAL_TABLET | Freq: Every day | ORAL | 0 refills | Status: DC

## 2016-01-22 MED ORDER — TRAMADOL HCL 50 MG PO TABS: 50.0000 mg | ORAL_TABLET | Freq: Four times a day (QID) | ORAL | 0 refills | Status: DC | PRN

## 2016-01-22 MED ORDER — AMOXICILLIN 500 MG PO CAPS: 500.0000 mg | ORAL_CAPSULE | Freq: Three times a day (TID) | ORAL | 0 refills | Status: DC

## 2016-01-22 MED ORDER — CLONIDINE HCL 0.1 MG PO TABS
0.1000 mg | ORAL_TABLET | Freq: Once | ORAL | Status: AC
  Administered 2016-01-22: 0.1 mg via ORAL
  Filled 2016-01-22: qty 1

## 2016-01-22 NOTE — ED Notes (Signed)
Pt ambulatory to restroom with steady gait. Bleeding controlled at present.

## 2016-01-22 NOTE — ED Notes (Signed)
ENT cart at bedside

## 2016-01-22 NOTE — Discharge Instructions (Signed)
Please be sure to call our ENT specialist in 2 days to schedule appropriate follow-up. Return here for concerning changes in your condition.

## 2016-01-22 NOTE — ED Triage Notes (Signed)
Pt to Ed by EMS c/o epistaxis onset at 2315. Pt was seen yesterday morning for the same thing. Bleeding noted from both nostrils. Pt also reports bleeding from R eye onset 15 mins ago.

## 2016-01-22 NOTE — Telephone Encounter (Signed)
Call from pt requiring follow up MD contact info.  Pt provided contact info for Dr Christia Readingwight Bates.

## 2016-01-22 NOTE — ED Provider Notes (Signed)
[facility] Provider Note   CSN: [id number] Arrival date & time: 01/22/16  0015  By signing my name below, I, Soijett Blue, attest that this documentation has been prepared under the direction and in the presence of Gerhard Munchobert Blessen Kimbrough, MD. Electronically Signed: Soijett Blue, ED Scribe. 01/22/16. 12:46 AM.   History   Chief Complaint Chief Complaint  Patient presents with  . Epistaxis    HPI  Michelle Houston is a 35 y.o. female with a medical hx of HTN, DM, who presents to the Emergency Department complaining of bilateral nose bleeding onset 1 hour PTA. Pt states that PTA she had bleeding from her right eye and bilateral nares. Pt reports that she has been applying pressure to her nose by pinching her nostrils x 1 hour with no relief of her symptoms. Pt states that she had a similar episode earlier and was seen in the ED for her symptoms, but her nose bleeding resolved while in the ED and she was Rx afrin. Pt denies having nose bleeds in the past. Pt states that she has associated symptoms of resolved right eye bleeding. She notes that she has tried Rx afrin with no relief of her symptoms. She denies any other symptoms. Pt states that she takes losartan-HCTZ 50-12.5 mg daily for her HTN.    The history is provided by the patient. No language interpreter was used.    Past Medical History:  Diagnosis Date  . Bronchitis   . Diabetes mellitus   . Hypertension   . PCOS (polycystic ovarian syndrome)     Patient Active Problem List   Diagnosis Date Noted  . Normal delivery 05/12/2014  . Pregnancy 05/11/2014  . GBS (group B Streptococcus carrier), +RV culture, currently pregnant 04/21/2014  . Pre-existing essential hypertension affecting pregnancy in third trimester   . Pre-existing diabetes mellitus affecting pregnancy in third trimester, antepartum   . GERD without esophagitis 03/05/2014  . Supervision of other normal pregnancy 10/13/2013  . Chronic hypertension 10/13/2013     Past Surgical History:  Procedure Laterality Date  . BLADDER REPAIR    . CARPAL TUNNEL RELEASE    . CHOLECYSTECTOMY      OB History    Gravida Para Term Preterm AB Living   4 3 3   1 3    SAB TAB Ectopic Multiple Live Births   1     0 3       Home Medications    Prior to Admission medications   Medication Sig Start Date End Date Taking? Authorizing Provider  losartan-hydrochlorothiazide (HYZAAR) 50-12.5 MG tablet Take 1 tablet by mouth daily.    Historical Provider, MD  oxymetazoline (AFRIN NASAL SPRAY) 0.05 % nasal spray Place 1 spray into both nostrils 2 (two) times daily. 01/21/16 01/24/16  Derwood KaplanAnkit Nanavati, MD    Family History Family History  Problem Relation Age of Onset  . Cancer Maternal Grandmother   . Hypertension Mother   . Diabetes Paternal Aunt   . Stroke Maternal Grandfather   . Diabetes Paternal Grandmother   . Stroke Paternal Grandmother   . Cancer Father     Social History Social History  Substance Use Topics  . Smoking status: Current Every Day Smoker    Packs/day: 1.00    Years: 18.00    Types: Cigarettes  . Smokeless tobacco: Never Used  . Alcohol use 0.0 oz/week     Comment: occ     Allergies   Review of patient's allergies indicates no known allergies.  Review of Systems Review of Systems  Constitutional:       Per HPI, otherwise negative  HENT: Positive for nosebleeds.        Per HPI, otherwise negative  Respiratory:       Per HPI, otherwise negative  Cardiovascular:       Per HPI, otherwise negative  Gastrointestinal: Negative for vomiting.  Endocrine:       Negative aside from HPI  Genitourinary:       Neg aside from HPI   Musculoskeletal:       Per HPI, otherwise negative  Skin: Negative.   Neurological: Negative for syncope.     Physical Exam Updated Vital Signs BP 191/99   Pulse (!) 134   Temp 98.9 F (37.2 C) (Oral)   Resp 18   LMP 12/21/2015 (Exact Date)   SpO2 100%   Physical Exam  Constitutional: She  is oriented to person, place, and time. She appears well-developed and well-nourished. No distress.  HENT:  Head: Normocephalic and atraumatic.  Substantial gross blood in each nostril  Eyes: Conjunctivae and EOM are normal.  Cardiovascular: Normal rate and regular rhythm.   Pulmonary/Chest: Effort normal and breath sounds normal. No stridor. No respiratory distress.  Abdominal: She exhibits no distension.  Musculoskeletal: She exhibits no edema.  Neurological: She is alert and oriented to person, place, and time. No cranial nerve deficit.  Skin: Skin is warm and dry.  Psychiatric: She has a normal mood and affect.  Nursing note and vitals reviewed.    ED Treatments / Results  DIAGNOSTIC STUDIES: Oxygen Saturation is 100% on RA, nl by my interpretation.    COORDINATION OF CARE: 12:55 AM Discussed treatment plan with pt at bedside which includes epistaxis management and pt agreed to plan.   Procedures .Epistaxis Management Date/Time: 01/22/2016 12:55 AM Performed by: Gerhard Munch Authorized by: Gerhard Munch   Consent:    Consent obtained:  Verbal   Consent given by:  Patient   Risks discussed:  Pain   Alternatives discussed:  Observation Anesthesia (see MAR for exact dosages):    Anesthesia method:  None Procedure details:    Treatment site:  R posterior   Treatment method:  Nasal balloon   Treatment complexity:  Extensive   Treatment episode: recurring   Post-procedure details:    Assessment:  Bleeding stopped   Patient tolerance of procedure:  Tolerated well, no immediate complications    (including critical care time)  Medications Ordered in ED Medications - No data to display   Initial Impression / Assessment and Plan / ED Course  I have reviewed the triage vital signs and the nursing notes.   Clinical Course    3:36 AM Patient with no additional bleeding.  Follow-up instructions, home care monitoring, instructions discussed with the patient and  her mother.  Blood pressure now normal Final Clinical Impressions(s) / ED Diagnoses  Patient with history of hypertension presents with hypertensive urgency, ongoing epistaxis. Here the patient was found have substantial bleeding, bilaterally, though after initial packing with Neo-Synephrine, only posterior right nostril bleed is persistent. Patient tolerated placement of Rhino Rocket. Patient received clonidine for blood pressure control, and had substantial reduction in her blood pressure. With no recurrence of bleeding, she was discharged in stable condition with ongoing monitoring, following up with ENT.    Gerhard Munch, MD 01/22/16 [phone]

## 2016-01-22 NOTE — ED Triage Notes (Signed)
The pt is c/o a nosebleed since Friday she was seen here last pm and had her nose packed  She woke up tonight and it was bleeding again.  Only a small amount of blood at present

## 2016-01-23 ENCOUNTER — Emergency Department (HOSPITAL_COMMUNITY)
Admission: EM | Admit: 2016-01-23 | Discharge: 2016-01-23 | Disposition: A | Discharge status: Home / Self Care | Payer: Medicaid Other | Source: Home / Self Care | Attending: Emergency Medicine | Admitting: Emergency Medicine

## 2016-01-23 DIAGNOSIS — R040 Epistaxis: Secondary | ICD-10-CM

## 2016-01-23 DIAGNOSIS — R51 Headache: Secondary | ICD-10-CM

## 2016-01-23 DIAGNOSIS — I10 Essential (primary) hypertension: Secondary | ICD-10-CM

## 2016-01-23 DIAGNOSIS — R519 Headache, unspecified: Secondary | ICD-10-CM

## 2016-01-23 LAB — I-STAT BETA HCG BLOOD, ED (MC, WL, AP ONLY): I-stat hCG, quantitative: <5 m[IU]/mL (ref <5)

## 2016-01-23 LAB — CBC WITH DIFFERENTIAL/PLATELET
BASOS ABS: 0 10*3/uL (ref 0.0–0.1)
BASOS PCT: 0 %
EOS ABS: 0.1 10*3/uL (ref 0.0–0.7)
EOS PCT: 1 %
HCT: 46.4 % — ABNORMAL HIGH (ref 36.0–46.0)
Hemoglobin: 15.6 g/dL — ABNORMAL HIGH (ref 12.0–15.0)
Lymphocytes Relative: 29 %
Lymphs Abs: 2.2 10*3/uL (ref 0.7–4.0)
MCH: 30.3 pg (ref 26.0–34.0)
MCHC: 33.6 g/dL (ref 30.0–36.0)
MCV: 90.1 fL (ref 78.0–100.0)
MONO ABS: 0.5 10*3/uL (ref 0.1–1.0)
MONOS PCT: 7 %
NEUTROS ABS: 4.7 10*3/uL (ref 1.7–7.7)
Neutrophils Relative %: 63 %
PLATELETS: 322 10*3/uL (ref 150–400)
RBC: 5.15 MIL/uL — ABNORMAL HIGH (ref 3.87–5.11)
RDW: 12.7 % (ref 11.5–15.5)
WBC: 7.5 10*3/uL (ref 4.0–10.5)

## 2016-01-23 LAB — BASIC METABOLIC PANEL
ANION GAP: 12 (ref 5–15)
BUN: 7 mg/dL (ref 6–20)
CALCIUM: 9.6 mg/dL (ref 8.9–10.3)
CO2: 23 mmol/L (ref 22–32)
CREATININE: 0.81 mg/dL (ref 0.44–1.00)
Chloride: 101 mmol/L (ref 101–111)
Glucose, Bld: 129 mg/dL — ABNORMAL HIGH (ref 65–99)
Potassium: 3.5 mmol/L (ref 3.5–5.1)
Sodium: 136 mmol/L (ref 135–145)

## 2016-01-23 MED ORDER — OXYCODONE-ACETAMINOPHEN 5-325 MG PO TABS: 1.0000 | ORAL_TABLET | Freq: Four times a day (QID) | ORAL | 0 refills | Status: DC | PRN

## 2016-01-23 MED ORDER — HYDROMORPHONE HCL 1 MG/ML IJ SOLN
1.0000 mg | Freq: Once | INTRAMUSCULAR | Status: AC
  Administered 2016-01-23: 1 mg via INTRAMUSCULAR
  Filled 2016-01-23: qty 1

## 2016-01-23 MED ORDER — HYDROMORPHONE HCL 1 MG/ML IJ SOLN: 1.0000 mg | Freq: Once | INTRAMUSCULAR | Status: DC

## 2016-01-23 NOTE — ED Provider Notes (Signed)
TIME SEEN: 1:50 AM  CHIEF COMPLAINT: Hypertension, epistaxis  HPI: Pt is a 35 y.o. female with history of hypertension, diabetes, PCO S, anxiety who presents to the emergency department for right-sided epistaxis. Was seen in the emergency department on September 2 and September 3 for the same. Had a Rhino Rocket placed in the left nostril during her last visit. States she has been taking her losartan and hydrochlorothiazide as prescribed and was just started on clonidine yesterday. Last dose was 2 PM yesterday. States she is having a lot of pain in the left sinus. Tonight she started noticing that she had some blood in the back of her throat and was spitting up clear sputum with blood. No chest pain or shortness of breath. No severe headache, vision changes. No numbness, tingling or focal weakness. She is not bleeding currently. Rhino Rocket is still in place. Called the ENT physician on call who instructed her to call their office Tuesday morning for an appointment. She is not on anticoagulation or antiplatelets.  ROS: See HPI Constitutional: no fever  Eyes: no drainage  ENT: no runny nose   Cardiovascular:  no chest pain  Resp: no SOB  GI: no vomiting GU: no dysuria Integumentary: no rash  Allergy: no hives  Musculoskeletal: no leg swelling  Neurological: no slurred speech ROS otherwise negative  PAST MEDICAL HISTORY/PAST SURGICAL HISTORY:  Past Medical History:  Diagnosis Date  . Bronchitis   . Diabetes mellitus   . Hypertension   . PCOS (polycystic ovarian syndrome)     MEDICATIONS:  Prior to Admission medications   Medication Sig Start Date End Date Taking? Authorizing Provider  amoxicillin (AMOXIL) 500 MG capsule Take 1 capsule (500 mg total) by mouth 3 (three) times daily. 01/22/16   Gerhard Munchobert Lockwood, MD  cloNIDine (CATAPRES) 0.1 MG tablet Take 1 tablet (0.1 mg total) by mouth daily. 01/22/16   Gerhard Munchobert Lockwood, MD  losartan-hydrochlorothiazide (HYZAAR) 50-12.5 MG tablet Take 1  tablet by mouth daily.    Historical Provider, MD  oxymetazoline (AFRIN NASAL SPRAY) 0.05 % nasal spray Place 1 spray into both nostrils 2 (two) times daily. Patient taking differently: Place 1 spray into both nostrils 2 (two) times daily as needed for congestion.  01/21/16 01/24/16  Derwood KaplanAnkit Nanavati, MD  traMADol (ULTRAM) 50 MG tablet Take 1 tablet (50 mg total) by mouth every 6 (six) hours as needed. 01/22/16   Gerhard Munchobert Lockwood, MD    ALLERGIES:  No Known Allergies  SOCIAL HISTORY:  Social History  Substance Use Topics  . Smoking status: Current Every Day Smoker    Packs/day: 1.00    Years: 18.00    Types: Cigarettes  . Smokeless tobacco: Never Used  . Alcohol use 0.0 oz/week     Comment: occ    FAMILY HISTORY: Family History  Problem Relation Age of Onset  . Cancer Maternal Grandmother   . Hypertension Mother   . Diabetes Paternal Aunt   . Stroke Maternal Grandfather   . Diabetes Paternal Grandmother   . Stroke Paternal Grandmother   . Cancer Father     EXAM: BP (!) 177/118 (BP Location: Right Arm)   Pulse 100   Temp 98.4 F (36.9 C) (Oral)   Resp 16   LMP 12/21/2015 (Exact Date)   SpO2 100%  CONSTITUTIONAL: Alert and oriented and responds appropriately to questions. Tearful, appears uncomfortable but is afebrile and nontoxic HEAD: Normocephalic EYES: Conjunctivae clear, PERRL ENT: normal nose; no rhinorrhea; moist mucous membranes, Rhino Rocket in the  right nostril with no bleeding around it and no blood in the posterior oropharynx; No pharyngeal erythema or petechiae, no tonsillar hypertrophy or exudate, no uvular deviation, no trismus or drooling, normal phonation, no stridor, no dental caries present, no drainable dental abscess noted, no Ludwig's angina, tongue sits flat in the bottom of the mouth, no angioedema, no facial erythema or warmth, no facial swelling NECK: Supple, no meningismus, no LAD  CARD: RRR; S1 and S2 appreciated; no murmurs, no clicks, no rubs, no  gallops RESP: Normal chest excursion without splinting or tachypnea; breath sounds clear and equal bilaterally; no wheezes, no rhonchi, no rales, no hypoxia or respiratory distress, speaking full sentences ABD/GI: Normal bowel sounds; non-distended; soft, non-tender, no rebound, no guarding, no peritoneal signs BACK:  The back appears normal and is non-tender to palpation, there is no CVA tenderness EXT: Normal ROM in all joints; non-tender to palpation; no edema; normal capillary refill; no cyanosis, no calf tenderness or swelling    SKIN: Normal color for age and race; warm; no rash NEURO: Moves all extremities equally, sensation to light touch intact diffusely, cranial nerves II through XII intact PSYCH: The patient's mood and manner are appropriate. Grooming and personal hygiene are appropriate.  MEDICAL DECISION MAKING: Patient here with bleeding down her throat earlier this evening around her [employer]. She is not bleeding currently. Have offered to change out her [employer] but she refuses that she states it was very uncomfortable. She is having a lot of pain around the site. No sign of facial cellulitis and doubt intracranial hemorrhage. I feel that pain and anxiety are driving her blood pressure up which is likely what is causing her to continue to have intermittent bleeding. We'll leave her Rhino Rocket in at this time and work on pain control to see if this helps with her blood pressure and continue to monitor to make sure that there is no bleeding starts again in the ED.  ED PROGRESS: 4:45 AM  Labs are unremarkable. Blood pressure has improved.  Her pain is also improved. She has not had any further bleeding in the emergency department. We will leave her Rhino Rocket in place and have her follow-up with ENT. We'll discharge with perception for Percocet given she states Ultram is not helping. Discussed with her return precautions. Advised her to continue her blood pressure medications as  prescribed. She verbalizes understanding and is comfortable with this plan.     EKG Interpretation  Date/Time:  Monday January 23 2016 04:11:26 EDT Ventricular Rate:  82 PR Interval:    QRS Duration: 66 QT Interval:  350 QTC Calculation: 409 R Axis:   83 Text Interpretation:  Sinus arrhythmia Baseline wander in lead(s) II III aVF No significant change since last tracing Confirmed by Lyrik Buresh,  DO, Bernhardt Riemenschneider [id number]) on 01/23/2016 4:13:43 AM         Layla Maw Donald Jacque, DO 01/23/16 6045

## 2016-01-24 ENCOUNTER — Ambulatory Visit (HOSPITAL_COMMUNITY)
Admission: EM | Admit: 2016-01-24 | Discharge: 2016-01-24 | Disposition: A | Discharge status: Home / Self Care | Payer: Medicaid Other | Attending: Internal Medicine | Admitting: Internal Medicine

## 2016-01-24 ENCOUNTER — Encounter (HOSPITAL_COMMUNITY): Payer: Self-pay | Admitting: Emergency Medicine

## 2016-01-24 DIAGNOSIS — R040 Epistaxis: Secondary | ICD-10-CM | POA: Diagnosis not present

## 2016-01-24 NOTE — Discharge Instructions (Signed)
Followup with ENT as planned.  Continue amoxicillin.  Blood pressure under good control today at urgent care.

## 2016-01-24 NOTE — ED Triage Notes (Signed)
[facility] a [facility] of a possible infection involving a Rhyno-Rocket that was placed in her right nare on 01/21/2016 for an uncontrolled epistaxis. The patient stated that she was referred to an ENT but did not have the money for the visit.

## 2016-01-24 NOTE — ED Provider Notes (Signed)
[facility]    CSN: [id number] Arrival date & time: 01/24/16  1246  First Provider Contact:  None       History   Chief Complaint Chief Complaint  Patient presents with  . Follow-up    HPI Michelle Houston is a 35 y.o. female. Blood pressure was elevated over the weekend, and she had some bleeding from the right side of her nose. She was seen a couple of times in the emergency room, and had a Rhino Rocket placed. She has a lot of sensation of pressure in her right maxillary and frontal sinuses, the Rhino Rocket is uncomfortable, and now she says there is some odor. She is worried about infection. She is taking amoxicillin as prescribed.    HPI  Past Medical History:  Diagnosis Date  . Bronchitis   . Diabetes mellitus   . Hypertension   . PCOS (polycystic ovarian syndrome)     Patient Active Problem List   Diagnosis Date Noted  . Normal delivery 05/12/2014  . Pregnancy 05/11/2014  . GBS (group B Streptococcus carrier), +RV culture, currently pregnant 04/21/2014  . Pre-existing essential hypertension affecting pregnancy in third trimester   . Pre-existing diabetes mellitus affecting pregnancy in third trimester, antepartum   . GERD without esophagitis 03/05/2014  . Supervision of other normal pregnancy 10/13/2013  . Chronic hypertension 10/13/2013    Past Surgical History:  Procedure Laterality Date  . BLADDER REPAIR    . CARPAL TUNNEL RELEASE    . CHOLECYSTECTOMY      OB History    Gravida Para Term Preterm AB Living   4 3 3   1 3    SAB TAB Ectopic Multiple Live Births   1     0 3       Home Medications    Prior to Admission medications   Medication Sig Start Date End Date Taking? Authorizing Provider  amoxicillin (AMOXIL) 500 MG capsule Take 1 capsule (500 mg total) by mouth 3 (three) times daily. 01/22/16   Gerhard Munchobert Lockwood, MD  cloNIDine (CATAPRES) 0.1 MG tablet Take 1 tablet (0.1 mg total) by mouth daily. 01/22/16   Gerhard Munchobert Lockwood, MD    losartan-hydrochlorothiazide (HYZAAR) 50-12.5 MG tablet Take 1 tablet by mouth daily.    Historical Provider, MD  oxyCODONE-acetaminophen (PERCOCET/ROXICET) 5-325 MG tablet Take 1-2 tablets by mouth every 6 (six) hours as needed. 01/23/16   Kristen N Ward, DO  oxymetazoline (AFRIN NASAL SPRAY) 0.05 % nasal spray Place 1 spray into both nostrils 2 (two) times daily. Patient taking differently: Place 1 spray into both nostrils 2 (two) times daily as needed for congestion.  01/21/16 01/24/16  Derwood KaplanAnkit Nanavati, MD    Family History Family History  Problem Relation Age of Onset  . Cancer Maternal Grandmother   . Hypertension Mother   . Diabetes Paternal Aunt   . Stroke Maternal Grandfather   . Diabetes Paternal Grandmother   . Stroke Paternal Grandmother   . Cancer Father     Social History Social History  Substance Use Topics  . Smoking status: Current Every Day Smoker    Packs/day: 1.00    Years: 18.00    Types: Cigarettes  . Smokeless tobacco: Never Used  . Alcohol use 0.0 oz/week     Comment: occ     Allergies   Review of patient's allergies indicates no known allergies.   Review of Systems Review of Systems  All other systems reviewed and are negative.    Physical  Exam Triage Vital Signs ED Triage Vitals  Enc Vitals Group     BP 01/24/16 1312 126/87     Pulse Rate 01/24/16 1312 109     Resp 01/24/16 1312 20     Temp 01/24/16 1312 98.5 F (36.9 C)     Temp Source 01/24/16 1312 Oral     SpO2 01/24/16 1312 97 %     Weight --      Height --      Pain Score 01/24/16 1313 4   Updated Vital Signs BP 126/87 (BP Location: Left Arm)   Pulse 109   Temp 98.5 F (36.9 C) (Oral)   Resp 20   LMP 12/21/2015 (Exact Date)   SpO2 97%  Physical Exam  Constitutional: She is oriented to person, place, and time. No distress.  Alert, nicely groomed  HENT:  Head: Atraumatic.  Rhino Rocket in right naris, taped to right cheek  Eyes:  Conjugate gaze, no eye redness/drainage   Neck: Neck supple.  Cardiovascular: Normal rate.   Pulmonary/Chest: No respiratory distress.  Abdominal: She exhibits no distension.  Musculoskeletal: Normal range of motion.  No leg swelling  Neurological: She is alert and oriented to person, place, and time.  Skin: Skin is warm and dry.  No cyanosis  Nursing note and vitals reviewed.    UC Treatments / Results   Procedures Procedures (including critical care time)      none  Final Clinical Impressions(s) / UC Diagnoses   Final diagnoses:  Right-sided epistaxis   No active bleeding. Blood pressure controlled today, no fever. Continue amoxicillin. Follow-up with ENT on 9/7 as planned.     Eustace Moore, MD 01/29/16 [phone]

## 2016-01-26 DIAGNOSIS — I10 Essential (primary) hypertension: Secondary | ICD-10-CM | POA: Insufficient documentation

## 2016-01-26 DIAGNOSIS — R040 Epistaxis: Secondary | ICD-10-CM | POA: Insufficient documentation

## 2016-04-08 ENCOUNTER — Encounter (HOSPITAL_BASED_OUTPATIENT_CLINIC_OR_DEPARTMENT_OTHER): Payer: Self-pay | Admitting: *Deleted

## 2016-04-08 ENCOUNTER — Emergency Department (HOSPITAL_BASED_OUTPATIENT_CLINIC_OR_DEPARTMENT_OTHER)
Admission: EM | Admit: 2016-04-08 | Discharge: 2016-04-08 | Disposition: A | Discharge status: Home / Self Care | Payer: Medicaid Other | Attending: Emergency Medicine | Admitting: Emergency Medicine

## 2016-04-08 DIAGNOSIS — I10 Essential (primary) hypertension: Secondary | ICD-10-CM | POA: Diagnosis not present

## 2016-04-08 DIAGNOSIS — Z7984 Long term (current) use of oral hypoglycemic drugs: Secondary | ICD-10-CM | POA: Diagnosis not present

## 2016-04-08 DIAGNOSIS — E119 Type 2 diabetes mellitus without complications: Secondary | ICD-10-CM | POA: Diagnosis not present

## 2016-04-08 DIAGNOSIS — Z79899 Other long term (current) drug therapy: Secondary | ICD-10-CM | POA: Diagnosis not present

## 2016-04-08 DIAGNOSIS — N899 Noninflammatory disorder of vagina, unspecified: Secondary | ICD-10-CM | POA: Diagnosis present

## 2016-04-08 DIAGNOSIS — N898 Other specified noninflammatory disorders of vagina: Secondary | ICD-10-CM

## 2016-04-08 DIAGNOSIS — F17210 Nicotine dependence, cigarettes, uncomplicated: Secondary | ICD-10-CM | POA: Insufficient documentation

## 2016-04-08 LAB — URINALYSIS, ROUTINE W REFLEX MICROSCOPIC
Bilirubin Urine: NEGATIVE
Glucose, UA: NEGATIVE mg/dL
Hgb urine dipstick: NEGATIVE
KETONES UR: NEGATIVE mg/dL
LEUKOCYTES UA: NEGATIVE
Nitrite: NEGATIVE
PROTEIN: NEGATIVE mg/dL
Specific Gravity, Urine: 1.007 (ref 1.005–1.030)
pH: 5.5 (ref 5.0–8.0)

## 2016-04-08 LAB — WET PREP, GENITAL
CLUE CELLS WET PREP: NONE SEEN
Sperm: NONE SEEN
Trich, Wet Prep: NONE SEEN
YEAST WET PREP: NONE SEEN

## 2016-04-08 LAB — PREGNANCY, URINE: PREG TEST UR: NEGATIVE

## 2016-04-08 NOTE — ED Triage Notes (Signed)
Pt c/o vaginal d/c for past week. Describes as a mucus d/c that is blood tinged. Concerned because she should have had her menstrual cycle this past Wednesday. States she has had some vaginal itching as well so concerned that she may have a UTI.

## 2016-04-08 NOTE — ED Notes (Signed)
Pt left without being discharged by RN.

## 2016-04-08 NOTE — ED Provider Notes (Signed)
[facility] Provider Note: Michelle Houston Michelle Rivard, MD, FACEP  CSN: [id number] MRN: [id number] ARRIVAL: 04/08/16 at 0122 ROOM: [facility]03/[facility]03   CHIEF COMPLAINT  Vaginal Discharge   HISTORY OF PRESENT ILLNESS  Michelle Houston Neuroth is a 35 y.o. female with a one-week history of a vaginal discharge. She describes the vaginal discharge as watery with blood streaks in it. She has not noticed an abnormal odor. Her period was due this week but it has not yet started other than the blood streaking is noted above. She denies dysuria. She denies fever, chills, nausea, vomiting and diarrhea.   Past Medical History:  Diagnosis Date  . Bronchitis   . Diabetes mellitus   . Hypertension   . PCOS (polycystic ovarian syndrome)     Past Surgical History:  Procedure Laterality Date  . BLADDER REPAIR    . CARPAL TUNNEL RELEASE    . CHOLECYSTECTOMY      Family History  Problem Relation Age of Onset  . Cancer Maternal Grandmother   . Hypertension Mother   . Diabetes Paternal Aunt   . Stroke Maternal Grandfather   . Diabetes Paternal Grandmother   . Stroke Paternal Grandmother   . Cancer Father     Social History  Substance Use Topics  . Smoking status: Current Every Day Smoker    Packs/day: 1.00    Years: 18.00    Types: Cigarettes  . Smokeless tobacco: Never Used  . Alcohol use 0.0 oz/week     Comment: rare     Prior to Admission medications   Medication Sig Start Date End Date Taking? Authorizing Provider  metFORMIN (GLUCOPHAGE) 500 MG tablet Take 500 mg by mouth 2 (two) times daily with a meal.   Yes Historical Provider, MD  zolpidem (AMBIEN) 5 MG tablet Take 5 mg by mouth at bedtime as needed for sleep.   Yes Historical Provider, MD  amoxicillin (AMOXIL) 500 MG capsule Take 1 capsule (500 mg total) by mouth 3 (three) times daily. 01/22/16   Gerhard Munchobert Lockwood, MD  cloNIDine (CATAPRES) 0.1 MG tablet Take 1 tablet (0.1 mg total) by mouth daily. 01/22/16   Gerhard Munchobert Lockwood, MD    losartan-hydrochlorothiazide (HYZAAR) 50-12.5 MG tablet Take 1 tablet by mouth daily.    Historical Provider, MD  oxyCODONE-acetaminophen (PERCOCET/ROXICET) 5-325 MG tablet Take 1-2 tablets by mouth every 6 (six) hours as needed. 01/23/16   Kristen N Ward, DO    Allergies Patient has no known allergies.   REVIEW OF SYSTEMS  Negative except as noted here or in the History of Present Illness.   PHYSICAL EXAMINATION  Initial Vital Signs Blood pressure 138/81, temperature 97.9 F (36.6 C), resp. rate 18, height 5' 1.5" (1.562 m), weight 238 lb (108 kg), last menstrual period 02/29/2016, SpO2 100 %, not currently breastfeeding.  Examination General: Well-developed, well-nourished female in no acute distress; appearance consistent with age of record HENT: normocephalic; atraumatic Eyes: pupils equal, round and reactive to light; extraocular muscles intact Neck: supple Heart: regular rate and rhythm Lungs: clear to auscultation bilaterally Abdomen: soft; nondistended; nontender; no masses or hepatosplenomegaly; bowel sounds present GU: No CVA tenderness; normal external genitalia; no vaginal bleeding; yellowish, watery vaginal discharge; no cervical motion tenderness Extremities: No deformity; full range of motion; pulses normal Neurologic: Awake, alert and oriented; motor function intact in all extremities and symmetric; no facial droop Skin: Warm and dry Psychiatric: Normal mood and affect   RESULTS  Summary of this visit's results, reviewed by myself:   EKG  Interpretation  Date/Time:    Ventricular Rate:    PR Interval:    QRS Duration:   QT Interval:    QTC Calculation:   R Axis:     Text Interpretation:        Laboratory Studies: Results for orders placed or performed during the hospital encounter of 04/08/16 (from the past 24 hour(s))  Pregnancy, urine     Status: None   Collection Time: 04/08/16  1:48 AM  Result Value Ref Range   Preg Test, Ur NEGATIVE NEGATIVE   Urinalysis, Routine w reflex microscopic ([facility])     Status: None   Collection Time: 04/08/16  1:48 AM  Result Value Ref Range   Color, Urine YELLOW YELLOW   APPearance CLEAR CLEAR   Specific Gravity, Urine 1.007 1.005 - 1.030   pH 5.5 5.0 - 8.0   Glucose, UA NEGATIVE NEGATIVE mg/dL   Hgb urine dipstick NEGATIVE NEGATIVE   Bilirubin Urine NEGATIVE NEGATIVE   Ketones, ur NEGATIVE NEGATIVE mg/dL   Protein, ur NEGATIVE NEGATIVE mg/dL   Nitrite NEGATIVE NEGATIVE   Leukocytes, UA NEGATIVE NEGATIVE  Wet prep, genital     Status: Abnormal   Collection Time: 04/08/16  2:58 AM  Result Value Ref Range   Yeast Wet Prep HPF POC NONE SEEN NONE SEEN   Trich, Wet Prep NONE SEEN NONE SEEN   Clue Cells Wet Prep HPF POC NONE SEEN NONE SEEN   WBC, Wet Prep HPF POC MANY (A) NONE SEEN   Sperm NONE SEEN    Imaging Studies: No results found.  ED COURSE  Nursing notes and initial vitals signs, including pulse oximetry, reviewed.  Vitals:   04/08/16 0142 04/08/16 0145  BP:  138/81  Resp:  18  Temp:  97.9 F (36.6 C)  SpO2:  100%  Weight: 238 lb (108 kg)   Height: 5' 1.5" (1.562 m)     PROCEDURES    ED DIAGNOSES     ICD-9-CM ICD-10-CM   1. Vaginal discharge 623.5 N89.8        Paula LibraJohn Holiday Mcmenamin, MD 04/08/16 [phone]

## 2016-04-09 LAB — GC/CHLAMYDIA PROBE AMP (~~LOC~~) NOT AT ARMC
Chlamydia: NEGATIVE
Neisseria Gonorrhea: NEGATIVE

## 2016-05-21 NOTE — L&D Delivery Note (Signed)
Patient is a 36 y.o. now [id number] s/p NSVD at 350w6d, who was admitted for IOL for NR NST. Pregnancy also complicated by T2DM and CHTN. AROM 20:33 with clear fliud. GBS neg.  Delivery Note At 10:23 PM a viable female was delivered via Vaginal, Spontaneous (Presentation: LOA).  APGAR: 9, 9; weight pending  Placenta status: intact;  Cord: 3-vessel.  Anesthesia:  Epidural Episiotomy: None Lacerations: None Est. Blood Loss (mL): 100  Mom to postpartum.  Baby to Couplet care / Skin to Skin.  Raynelle FanningJulie P. Aracely Rickett, MD OB Fellow 04/16/17, 11:11 PM

## 2016-06-13 ENCOUNTER — Ambulatory Visit (HOSPITAL_COMMUNITY)
Admission: EM | Admit: 2016-06-13 | Discharge: 2016-06-13 | Disposition: A | Discharge status: Home / Self Care | Payer: Medicaid Other | Attending: Emergency Medicine | Admitting: Emergency Medicine

## 2016-06-13 ENCOUNTER — Encounter (HOSPITAL_COMMUNITY): Payer: Self-pay | Admitting: Emergency Medicine

## 2016-06-13 ENCOUNTER — Emergency Department (HOSPITAL_BASED_OUTPATIENT_CLINIC_OR_DEPARTMENT_OTHER)
Admission: EM | Admit: 2016-06-13 | Discharge: 2016-06-13 | Disposition: A | Discharge status: Home / Self Care | Payer: Medicaid Other | Attending: Dermatology | Admitting: Dermatology

## 2016-06-13 ENCOUNTER — Encounter (HOSPITAL_BASED_OUTPATIENT_CLINIC_OR_DEPARTMENT_OTHER): Payer: Self-pay | Admitting: *Deleted

## 2016-06-13 DIAGNOSIS — I10 Essential (primary) hypertension: Secondary | ICD-10-CM | POA: Insufficient documentation

## 2016-06-13 DIAGNOSIS — J111 Influenza due to unidentified influenza virus with other respiratory manifestations: Secondary | ICD-10-CM

## 2016-06-13 DIAGNOSIS — Z5321 Procedure and treatment not carried out due to patient leaving prior to being seen by health care provider: Secondary | ICD-10-CM | POA: Diagnosis not present

## 2016-06-13 DIAGNOSIS — R05 Cough: Secondary | ICD-10-CM | POA: Insufficient documentation

## 2016-06-13 DIAGNOSIS — R69 Illness, unspecified: Secondary | ICD-10-CM

## 2016-06-13 DIAGNOSIS — Z7984 Long term (current) use of oral hypoglycemic drugs: Secondary | ICD-10-CM | POA: Diagnosis not present

## 2016-06-13 DIAGNOSIS — R059 Cough, unspecified: Secondary | ICD-10-CM

## 2016-06-13 DIAGNOSIS — E119 Type 2 diabetes mellitus without complications: Secondary | ICD-10-CM | POA: Insufficient documentation

## 2016-06-13 DIAGNOSIS — F17210 Nicotine dependence, cigarettes, uncomplicated: Secondary | ICD-10-CM | POA: Diagnosis not present

## 2016-06-13 DIAGNOSIS — Z79899 Other long term (current) drug therapy: Secondary | ICD-10-CM | POA: Insufficient documentation

## 2016-06-13 DIAGNOSIS — J40 Bronchitis, not specified as acute or chronic: Secondary | ICD-10-CM

## 2016-06-13 MED ORDER — AZITHROMYCIN 250 MG PO TABS: 250.0000 mg | ORAL_TABLET | Freq: Every day | ORAL | 0 refills | Status: DC

## 2016-06-13 MED ORDER — OSELTAMIVIR PHOSPHATE 75 MG PO CAPS: 75.0000 mg | ORAL_CAPSULE | Freq: Two times a day (BID) | ORAL | 0 refills | Status: DC

## 2016-06-13 MED ORDER — BENZONATATE 100 MG PO CAPS: 200.0000 mg | ORAL_CAPSULE | Freq: Three times a day (TID) | ORAL | 0 refills | Status: DC | PRN

## 2016-06-13 NOTE — ED Provider Notes (Signed)
CSN: [id number]     Arrival date & time 06/13/16  1249 History   First MD Initiated Contact with Patient 06/13/16 1308     Chief Complaint  Patient presents with  . Cough   (Consider location/radiation/quality/duration/timing/severity/associated sxs/prior Treatment) Patiient c/o cough and uri sx's.  She has developed a fever over last couple days.   The history is provided by the patient.  Cough  Cough characteristics:  Productive Sputum characteristics:  White Severity:  Moderate Onset quality:  Sudden Duration:  4 days Timing:  Constant Relieved by:  Nothing Worsened by:  Nothing Ineffective treatments:  None tried Associated symptoms: sore throat     Past Medical History:  Diagnosis Date  . Bronchitis   . Diabetes mellitus   . Hypertension   . PCOS (polycystic ovarian syndrome)    Past Surgical History:  Procedure Laterality Date  . BLADDER REPAIR    . CARPAL TUNNEL RELEASE    . CHOLECYSTECTOMY     Family History  Problem Relation Age of Onset  . Cancer Maternal Grandmother   . Hypertension Mother   . Diabetes Paternal Aunt   . Stroke Maternal Grandfather   . Diabetes Paternal Grandmother   . Stroke Paternal Grandmother   . Cancer Father    Social History  Substance Use Topics  . Smoking status: Current Every Day Smoker    Packs/day: 1.00    Years: 18.00    Types: Cigarettes  . Smokeless tobacco: Never Used  . Alcohol use 0.0 oz/week     Comment: rare    OB History    Gravida Para Term Preterm AB Living   4 3 3   1 3    SAB TAB Ectopic Multiple Live Births   1     0 3     Review of Systems  Constitutional: Negative.   HENT: Positive for sore throat.   Eyes: Negative.   Respiratory: Positive for cough.   Cardiovascular: Negative.   Gastrointestinal: Negative.   Endocrine: Negative.   Genitourinary: Negative.   Musculoskeletal: Negative.   Allergic/Immunologic: Negative.   Neurological: Negative.   Hematological: Negative.    Psychiatric/Behavioral: Negative.     Allergies  Patient has no known allergies.  Home Medications   Prior to Admission medications   Medication Sig Start Date End Date Taking? Authorizing Provider  azithromycin (ZITHROMAX) 250 MG tablet Take 1 tablet (250 mg total) by mouth daily. Take first 2 tablets together, then 1 every day until finished. 06/13/16   Deatra CanterWilliam J Oxford, FNP  benzonatate (TESSALON) 100 MG capsule Take 2 capsules (200 mg total) by mouth 3 (three) times daily as needed for cough. 06/13/16   Deatra CanterWilliam J Oxford, FNP  losartan-hydrochlorothiazide (HYZAAR) 50-12.5 MG tablet Take 1 tablet by mouth daily.    Historical Provider, MD  metFORMIN (GLUCOPHAGE) 500 MG tablet Take 500 mg by mouth 2 (two) times daily with a meal.    Historical Provider, MD  oseltamivir (TAMIFLU) 75 MG capsule Take 1 capsule (75 mg total) by mouth every 12 (twelve) hours. 06/13/16   Deatra CanterWilliam J Oxford, FNP  zolpidem (AMBIEN) 5 MG tablet Take 5 mg by mouth at bedtime as needed for sleep.    Historical Provider, MD   Meds Ordered and Administered this Visit  Medications - No data to display  BP 114/85 (BP Location: Right Arm)   Pulse 118   Temp 99.7 F (37.6 C) (Oral)   Resp 20   LMP 05/14/2016   SpO2 98%  No data found.   Physical Exam  Constitutional: She appears well-developed and well-nourished.  HENT:  Head: Normocephalic and atraumatic.  Right Ear: External ear normal.  Left Ear: External ear normal.  Mouth/Throat: Oropharynx is clear and moist.  Eyes: Conjunctivae and EOM are normal. Pupils are equal, round, and reactive to light.  Neck: Normal range of motion. Neck supple.  Cardiovascular: Normal rate, regular rhythm and normal heart sounds.   Pulmonary/Chest: Effort normal.  Nursing note and vitals reviewed.   Urgent Care Course     Procedures (including critical care time)  Labs Review Labs Reviewed - No data to display  Imaging Review No results found.   Visual Acuity  Review  Right Eye Distance:   Left Eye Distance:   Bilateral Distance:    Right Eye Near:   Left Eye Near:    Bilateral Near:         MDM   1. Bronchitis   2. Cough   3. Influenza-like illness    Zpak Tessalon Tamiflu  Push po fluids, rest, tylenol and motrin otc prn as directed for fever, arthralgias, and myalgias.  Follow up prn if sx's continue or persist.    Deatra Canter, FNP 06/13/16 1324

## 2016-06-13 NOTE — ED Triage Notes (Signed)
Pt reports cough, congestion, back pain, body aches x sunday

## 2016-06-13 NOTE — ED Notes (Signed)
Pt seen leaving ed with her mother, did not notify staff that she was leaving.

## 2016-06-13 NOTE — ED Triage Notes (Signed)
[facility] with a complaint of a cough, congestion and body aches x 4 days.

## 2016-08-27 ENCOUNTER — Encounter: Payer: Self-pay | Admitting: *Deleted

## 2016-08-27 ENCOUNTER — Ambulatory Visit (INDEPENDENT_AMBULATORY_CARE_PROVIDER_SITE_OTHER): Payer: Medicaid Other

## 2016-08-27 DIAGNOSIS — Z3201 Encounter for pregnancy test, result positive: Secondary | ICD-10-CM | POA: Diagnosis not present

## 2016-08-27 DIAGNOSIS — Z3200 Encounter for pregnancy test, result unknown: Secondary | ICD-10-CM

## 2016-08-27 LAB — POCT URINE PREGNANCY: Preg Test, Ur: POSITIVE — AB

## 2016-08-27 MED ORDER — PRENATAL PLUS 27-1 MG PO TABS: 1.0000 | ORAL_TABLET | Freq: Every day | ORAL | 5 refills | Status: DC

## 2016-08-27 NOTE — Progress Notes (Signed)
Patient is in the office for UPT, results positive, LMP 07-20-16, EDD 04-26-17. Patient is an estimated 5w 3d today.

## 2016-08-30 ENCOUNTER — Encounter (HOSPITAL_COMMUNITY): Payer: Self-pay

## 2016-08-30 ENCOUNTER — Emergency Department (HOSPITAL_COMMUNITY)
Admission: EM | Admit: 2016-08-30 | Discharge: 2016-08-30 | Disposition: A | Discharge status: Home / Self Care | Payer: Medicaid Other | Attending: Emergency Medicine | Admitting: Emergency Medicine

## 2016-08-30 DIAGNOSIS — E119 Type 2 diabetes mellitus without complications: Secondary | ICD-10-CM | POA: Insufficient documentation

## 2016-08-30 DIAGNOSIS — F17210 Nicotine dependence, cigarettes, uncomplicated: Secondary | ICD-10-CM | POA: Insufficient documentation

## 2016-08-30 DIAGNOSIS — O9A211 Injury, poisoning and certain other consequences of external causes complicating pregnancy, first trimester: Secondary | ICD-10-CM | POA: Insufficient documentation

## 2016-08-30 DIAGNOSIS — Z3A01 Less than 8 weeks gestation of pregnancy: Secondary | ICD-10-CM | POA: Insufficient documentation

## 2016-08-30 DIAGNOSIS — I10 Essential (primary) hypertension: Secondary | ICD-10-CM | POA: Diagnosis not present

## 2016-08-30 DIAGNOSIS — Z3491 Encounter for supervision of normal pregnancy, unspecified, first trimester: Secondary | ICD-10-CM

## 2016-08-30 DIAGNOSIS — Z7984 Long term (current) use of oral hypoglycemic drugs: Secondary | ICD-10-CM | POA: Insufficient documentation

## 2016-08-30 DIAGNOSIS — Y999 Unspecified external cause status: Secondary | ICD-10-CM | POA: Insufficient documentation

## 2016-08-30 DIAGNOSIS — Y9389 Activity, other specified: Secondary | ICD-10-CM | POA: Insufficient documentation

## 2016-08-30 DIAGNOSIS — Y92410 Unspecified street and highway as the place of occurrence of the external cause: Secondary | ICD-10-CM | POA: Diagnosis not present

## 2016-08-30 DIAGNOSIS — S3991XA Unspecified injury of abdomen, initial encounter: Secondary | ICD-10-CM | POA: Diagnosis not present

## 2016-08-30 DIAGNOSIS — Z79899 Other long term (current) drug therapy: Secondary | ICD-10-CM | POA: Insufficient documentation
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
## 2016-08-30 NOTE — Discharge Instructions (Signed)
Tylenol 1-2 tabs po q4h prn ° °

## 2016-08-31 ENCOUNTER — Inpatient Hospital Stay (HOSPITAL_COMMUNITY)
Admission: AD | Admit: 2016-08-31 | Discharge: 2016-08-31 | Disposition: A | Discharge status: Home / Self Care | Payer: No Typology Code available for payment source | Source: Ambulatory Visit | Attending: Obstetrics and Gynecology | Admitting: Obstetrics and Gynecology

## 2016-08-31 ENCOUNTER — Encounter (HOSPITAL_COMMUNITY): Payer: Self-pay

## 2016-08-31 ENCOUNTER — Inpatient Hospital Stay (HOSPITAL_COMMUNITY): Payer: No Typology Code available for payment source

## 2016-08-31 DIAGNOSIS — O99331 Smoking (tobacco) complicating pregnancy, first trimester: Secondary | ICD-10-CM | POA: Diagnosis not present

## 2016-08-31 DIAGNOSIS — O9989 Other specified diseases and conditions complicating pregnancy, childbirth and the puerperium: Secondary | ICD-10-CM

## 2016-08-31 DIAGNOSIS — Z3A01 Less than 8 weeks gestation of pregnancy: Secondary | ICD-10-CM | POA: Diagnosis not present

## 2016-08-31 DIAGNOSIS — F17210 Nicotine dependence, cigarettes, uncomplicated: Secondary | ICD-10-CM | POA: Insufficient documentation

## 2016-08-31 DIAGNOSIS — Z7984 Long term (current) use of oral hypoglycemic drugs: Secondary | ICD-10-CM | POA: Insufficient documentation

## 2016-08-31 DIAGNOSIS — R109 Unspecified abdominal pain: Secondary | ICD-10-CM | POA: Diagnosis not present

## 2016-08-31 DIAGNOSIS — M545 Low back pain: Secondary | ICD-10-CM | POA: Insufficient documentation

## 2016-08-31 DIAGNOSIS — M549 Dorsalgia, unspecified: Secondary | ICD-10-CM | POA: Diagnosis not present

## 2016-08-31 DIAGNOSIS — O26899 Other specified pregnancy related conditions, unspecified trimester: Secondary | ICD-10-CM

## 2016-08-31 DIAGNOSIS — O3680X0 Pregnancy with inconclusive fetal viability, not applicable or unspecified: Secondary | ICD-10-CM

## 2016-08-31 DIAGNOSIS — O26891 Other specified pregnancy related conditions, first trimester: Secondary | ICD-10-CM | POA: Diagnosis not present

## 2016-08-31 LAB — URINALYSIS, ROUTINE W REFLEX MICROSCOPIC
Bilirubin Urine: NEGATIVE
GLUCOSE, UA: NEGATIVE mg/dL
Ketones, ur: NEGATIVE mg/dL
Nitrite: NEGATIVE
PH: 5 (ref 5.0–8.0)
Protein, ur: 30 mg/dL — AB
Specific Gravity, Urine: 1.026 (ref 1.005–1.030)

## 2016-08-31 LAB — CBC
HEMATOCRIT: 39.9 % (ref 36.0–46.0)
HEMOGLOBIN: 13.7 g/dL (ref 12.0–15.0)
MCH: 29.8 pg (ref 26.0–34.0)
MCHC: 34.3 g/dL (ref 30.0–36.0)
MCV: 86.7 fL (ref 78.0–100.0)
Platelets: 328 10*3/uL (ref 150–400)
RBC: 4.6 MIL/uL (ref 3.87–5.11)
RDW: 13.5 % (ref 11.5–15.5)
WBC: 5 10*3/uL (ref 4.0–10.5)

## 2016-08-31 LAB — HCG, QUANTITATIVE, PREGNANCY: hCG, Beta Chain, Quant, S: 25236 m[IU]/mL — ABNORMAL HIGH (ref <5)

## 2016-08-31 MED ORDER — CYCLOBENZAPRINE HCL 5 MG PO TABS
7.5000 mg | ORAL_TABLET | Freq: Once | ORAL | Status: AC
  Administered 2016-08-31: 7.5 mg via ORAL
  Filled 2016-08-31: qty 2

## 2016-08-31 MED ORDER — CYCLOBENZAPRINE HCL 10 MG PO TABS: 10.0000 mg | ORAL_TABLET | Freq: Three times a day (TID) | ORAL | 0 refills | Status: DC | PRN

## 2016-08-31 NOTE — Discharge Instructions (Signed)
First Trimester of Pregnancy °The first trimester of pregnancy is from week 1 until the end of week 13 (months 1 through 3). A week after a sperm fertilizes an egg, the egg will implant on the wall of the uterus. This embryo will begin to develop into a baby. Genes from you and your partner will form the baby. The female genes will determine whether the baby will be a boy or a girl. At 6-8 weeks, the eyes and face will be formed, and the heartbeat can be seen on ultrasound. At the end of 12 weeks, all the baby's organs will be formed. °Now that you are pregnant, you will want to do everything you can to have a healthy baby. Two of the most important things are to get good prenatal care and to follow your health care provider's instructions. Prenatal care is all the medical care you receive before the baby's birth. This care will help prevent, find, and treat any problems during the pregnancy and childbirth. °Body changes during your first trimester °Your body goes through many changes during pregnancy. The changes vary from woman to woman. °· You may gain or lose a couple of pounds at first. °· You may feel sick to your stomach (nauseous) and you may throw up (vomit). If the vomiting is uncontrollable, call your health care provider. °· You may tire easily. °· You may develop headaches that can be relieved by medicines. All medicines should be approved by your health care provider. °· You may urinate more often. Painful urination may mean you have a bladder infection. °· You may develop heartburn as a result of your pregnancy. °· You may develop constipation because certain hormones are causing the muscles that push stool through your intestines to slow down. °· You may develop hemorrhoids or swollen veins (varicose veins). °· Your breasts may begin to grow larger and become tender. Your nipples may stick out more, and the tissue that surrounds them (areola) may become darker. °· Your gums may bleed and may be  sensitive to brushing and flossing. °· Dark spots or blotches (chloasma, mask of pregnancy) may develop on your face. This will likely fade after the baby is born. °· Your menstrual periods will stop. °· You may have a loss of appetite. °· You may develop cravings for certain kinds of food. °· You may have changes in your emotions from day to day, such as being excited to be pregnant or being concerned that something may go wrong with the pregnancy and baby. °· You may have more vivid and strange dreams. °· You may have changes in your hair. These can include thickening of your hair, rapid growth, and changes in texture. Some women also have hair loss during or after pregnancy, or hair that feels dry or thin. Your hair will most likely return to normal after your baby is born. °What to expect at prenatal visits °During a routine prenatal visit: °· You will be weighed to make sure you and the baby are growing normally. °· Your blood pressure will be taken. °· Your abdomen will be measured to track your baby's growth. °· The fetal heartbeat will be listened to between weeks 10 and 14 of your pregnancy. °· Test results from any previous visits will be discussed. °Your health care provider may ask you: °· How you are feeling. °· If you are feeling the baby move. °· If you have had any abnormal symptoms, such as leaking fluid, bleeding, severe headaches, or abdominal   cramping.  If you are using any tobacco products, including cigarettes, chewing tobacco, and electronic cigarettes.  If you have any questions. Other tests that may be performed during your first trimester include:  Blood tests to find your blood type and to check for the presence of any previous infections. The tests will also be used to check for low iron levels (anemia) and protein on red blood cells (Rh antibodies). Depending on your risk factors, or if you previously had diabetes during pregnancy, you may have tests to check for high blood sugar  that affects pregnant women (gestational diabetes).  Urine tests to check for infections, diabetes, or protein in the urine.  An ultrasound to confirm the proper growth and development of the baby.  Fetal screens for spinal cord problems (spina bifida) and Down syndrome.  HIV (human immunodeficiency virus) testing. Routine prenatal testing includes screening for HIV, unless you choose not to have this test.  You may need other tests to make sure you and the baby are doing well. Follow these instructions at home: Medicines   Follow your health care provider's instructions regarding medicine use. Specific medicines may be either safe or unsafe to take during pregnancy.  Take a prenatal vitamin that contains at least 600 micrograms (mcg) of folic acid.  If you develop constipation, try taking a stool softener if your health care provider approves. Eating and drinking   Eat a balanced diet that includes fresh fruits and vegetables, whole grains, good sources of protein such as meat, eggs, or tofu, and low-fat dairy. Your health care provider will help you determine the amount of weight gain that is right for you.  Avoid raw meat and uncooked cheese. These carry germs that can cause birth defects in the baby.  Eating four or five small meals rather than three large meals a day may help relieve nausea and vomiting. If you start to feel nauseous, eating a few soda crackers can be helpful. Drinking liquids between meals, instead of during meals, also seems to help ease nausea and vomiting.  Limit foods that are high in fat and processed sugars, such as fried and sweet foods.  To prevent constipation:  Eat foods that are high in fiber, such as fresh fruits and vegetables, whole grains, and beans.  Drink enough fluid to keep your urine clear or pale yellow. Activity   Exercise only as directed by your health care provider. Most women can continue their usual exercise routine during  pregnancy. Try to exercise for 30 minutes at least 5 days a week. Exercising will help you:  Control your weight.  Stay in shape.  Be prepared for labor and delivery.  Experiencing pain or cramping in the lower abdomen or lower back is a good sign that you should stop exercising. Check with your health care provider before continuing with normal exercises.  Try to avoid standing for long periods of time. Move your legs often if you must stand in one place for a long time.  Avoid heavy lifting.  Wear low-heeled shoes and practice good posture.  You may continue to have sex unless your health care provider tells you not to. Relieving pain and discomfort   Wear a good support bra to relieve breast tenderness.  Take warm sitz baths to soothe any pain or discomfort caused by hemorrhoids. Use hemorrhoid cream if your health care provider approves.  Rest with your legs elevated if you have leg cramps or low back pain.  If you develop  varicose veins in your legs, wear support hose. Elevate your feet for 15 minutes, 3-4 times a day. Limit salt in your diet. Prenatal care   Schedule your prenatal visits by the twelfth week of pregnancy. They are usually scheduled monthly at first, then more often in the last 2 months before delivery.  Write down your questions. Take them to your prenatal visits.  Keep all your prenatal visits as told by your health care provider. This is important. Safety   Wear your seat belt at all times when driving.  Make a list of emergency phone numbers, including numbers for family, friends, the hospital, and police and fire departments. General instructions   Ask your health care provider for a referral to a local prenatal education class. Begin classes no later than the beginning of month 6 of your pregnancy.  Ask for help if you have counseling or nutritional needs during pregnancy. Your health care provider can offer advice or refer you to specialists for  help with various needs.  Do not use hot tubs, steam rooms, or saunas.  Do not douche or use tampons or scented sanitary pads.  Do not cross your legs for long periods of time.  Avoid cat litter boxes and soil used by cats. These carry germs that can cause birth defects in the baby and possibly loss of the fetus by miscarriage or stillbirth.  Avoid all smoking, herbs, alcohol, and medicines not prescribed by your health care provider. Chemicals in these products affect the formation and growth of the baby.  Do not use any products that contain nicotine or tobacco, such as cigarettes and e-cigarettes. If you need help quitting, ask your health care provider. You may receive counseling support and other resources to help you quit.  Schedule a dentist appointment. At home, brush your teeth with a soft toothbrush and be gentle when you floss. Contact a health care provider if:  You have dizziness.  You have mild pelvic cramps, pelvic pressure, or nagging pain in the abdominal area.  You have persistent nausea, vomiting, or diarrhea.  You have a bad smelling vaginal discharge.  You have pain when you urinate.  You notice increased swelling in your face, hands, legs, or ankles.  You are exposed to fifth disease or chickenpox.  You are exposed to [place] measles (rubella) and have never had it. Get help right away if:  You have a fever.  You are leaking fluid from your vagina.  You have spotting or bleeding from your vagina.  You have severe abdominal cramping or pain.  You have rapid weight gain or loss.  You vomit blood or material that looks like coffee grounds.  You develop a severe headache.  You have shortness of breath.  You have any kind of trauma, such as from a fall or a car accident. Summary  The first trimester of pregnancy is from week 1 until the end of week 13 (months 1 through 3).  Your body goes through many changes during pregnancy. The changes vary  from woman to woman.  You will have routine prenatal visits. During those visits, your health care provider will examine you, discuss any test results you may have, and talk with you about how you are feeling. This information is not intended to replace advice given to you by your health care provider. Make sure you discuss any questions you have with your health care provider. Document Released: 05/01/2001 Document Revised: 04/18/2016 Document Reviewed: 04/18/2016 Elsevier Interactive Patient Education  2017 [employer] Prenatal Care Providers [facility]    [facility]  [phone]     Phone: [phone]          [facility]                      [facility]   [place]     Phone: [phone]  Phone: [phone]         [facility]     Phone: [phone]  Phone: [phone]           [facility] [place]                hone: [phone]  Phone: [phone]         [facility] Dr[address] Francoise Ceo      Phone: [phone]  Phone: [phone]         [facility]                Phone: [phone]  [facility]   [address] [place])          Phone: [phone] [facility]   Phone: [phone]
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
## 2016-08-31 NOTE — MAU Note (Signed)
Urine in lab 

## 2016-09-19 ENCOUNTER — Ambulatory Visit (INDEPENDENT_AMBULATORY_CARE_PROVIDER_SITE_OTHER): Payer: Medicaid Other | Admitting: Certified Nurse Midwife

## 2016-09-19 ENCOUNTER — Encounter: Payer: Self-pay | Admitting: Certified Nurse Midwife

## 2016-09-19 ENCOUNTER — Other Ambulatory Visit (HOSPITAL_COMMUNITY)
Admission: RE | Admit: 2016-09-19 | Discharge: 2016-09-19 | Disposition: A | Discharge status: Home / Self Care | Payer: Medicaid Other | Source: Ambulatory Visit | Attending: Certified Nurse Midwife | Admitting: Certified Nurse Midwife

## 2016-09-19 VITALS — BP 110/76 | HR 87 | Wt 229.0 lb

## 2016-09-19 DIAGNOSIS — Z9049 Acquired absence of other specified parts of digestive tract: Secondary | ICD-10-CM

## 2016-09-19 DIAGNOSIS — O10911 Unspecified pre-existing hypertension complicating pregnancy, first trimester: Secondary | ICD-10-CM

## 2016-09-19 DIAGNOSIS — I10 Essential (primary) hypertension: Secondary | ICD-10-CM

## 2016-09-19 DIAGNOSIS — Z9889 Other specified postprocedural states: Secondary | ICD-10-CM | POA: Diagnosis not present

## 2016-09-19 DIAGNOSIS — J40 Bronchitis, not specified as acute or chronic: Secondary | ICD-10-CM | POA: Insufficient documentation

## 2016-09-19 DIAGNOSIS — O24111 Pre-existing diabetes mellitus, type 2, in pregnancy, first trimester: Secondary | ICD-10-CM | POA: Diagnosis not present

## 2016-09-19 DIAGNOSIS — N870 Mild cervical dysplasia: Secondary | ICD-10-CM | POA: Insufficient documentation

## 2016-09-19 DIAGNOSIS — O0990 Supervision of high risk pregnancy, unspecified, unspecified trimester: Secondary | ICD-10-CM

## 2016-09-19 DIAGNOSIS — O99211 Obesity complicating pregnancy, first trimester: Secondary | ICD-10-CM | POA: Diagnosis not present

## 2016-09-19 DIAGNOSIS — O0991 Supervision of high risk pregnancy, unspecified, first trimester: Secondary | ICD-10-CM

## 2016-09-19 DIAGNOSIS — O3441 Maternal care for other abnormalities of cervix, first trimester: Secondary | ICD-10-CM | POA: Insufficient documentation

## 2016-09-19 DIAGNOSIS — E119 Type 2 diabetes mellitus without complications: Secondary | ICD-10-CM

## 2016-09-19 DIAGNOSIS — Z3A09 9 weeks gestation of pregnancy: Secondary | ICD-10-CM | POA: Insufficient documentation

## 2016-09-19 DIAGNOSIS — Z3481 Encounter for supervision of other normal pregnancy, first trimester: Secondary | ICD-10-CM

## 2016-09-19 DIAGNOSIS — E6609 Other obesity due to excess calories: Secondary | ICD-10-CM

## 2016-09-19 DIAGNOSIS — E669 Obesity, unspecified: Secondary | ICD-10-CM | POA: Insufficient documentation

## 2016-09-19 MED ORDER — ASPIRIN 81 MG PO CHEW: 81.0000 mg | CHEWABLE_TABLET | Freq: Every day | ORAL | 12 refills | Status: DC

## 2016-09-19 MED ORDER — PRENATE PIXIE 10-0.6-0.4-200 MG PO CAPS: 1.0000 | ORAL_CAPSULE | Freq: Every day | ORAL | 12 refills | Status: DC

## 2016-09-19 MED ORDER — LABETALOL HCL 200 MG PO TABS: 200.0000 mg | ORAL_TABLET | Freq: Two times a day (BID) | ORAL | 3 refills | Status: DC

## 2016-09-19 NOTE — Progress Notes (Signed)
Subjective:    Michelle Houston is being seen today for her first obstetrical visit.  This is not a planned pregnancy. She is at 21w0dgestation. Her obstetrical history is significant for advanced maternal age, obesity and hypertension,pcos, bronchitis, type 2 diabetes, cholesystetomy, bladder mesh surgery carpel tunnel repair.  last Alc is 6 % per patient. Relationship with FOB: spouse, living together,present at this visit today. Patient is a current smoker annd reports only smoking 7 cigarettes today. Patient does not intend to breast feed. Pregnancy history fully reviewed.  The information documented in the HPI was reviewed and verified.  Menstrual History: OB History    Gravida Para Term Preterm AB Living   '5 3 3   1 3   ' SAB TAB Ectopic Multiple Live Births   1     0 3      Menarche age: 89Patient's last menstrual period was 07/20/2016 (approximate).    Past Medical History:  Diagnosis Date  . Bronchitis   . Diabetes mellitus   . Hypertension   . PCOS (polycystic ovarian syndrome)     Past Surgical History:  Procedure Laterality Date  . BLADDER REPAIR    . CARPAL TUNNEL RELEASE    . CHOLECYSTECTOMY       (Not in a hospital admission) No Known Allergies  Social History  Substance Use Topics  . Smoking status: Current Every Day Smoker    Packs/day: 1.00    Years: 18.00    Types: Cigarettes  . Smokeless tobacco: Never Used     Comment: 6 day  . Alcohol use No     Comment: rare     Family History  Problem Relation Age of Onset  . Cancer Maternal Grandmother   . Hypertension Mother   . Diabetes Paternal Aunt   . Stroke Maternal Grandfather   . Diabetes Paternal Grandmother   . Stroke Paternal Grandmother   . Cancer Father      Review of Systems Constitutional: negative for weight loss Gastrointestinal: negative for vomiting Genitourinary:negative for genital lesions and vaginal discharge and dysuria Musculoskeletal:negative for back  pain Behavioral/Psych: negative for abusive relationship, depression, illegal drug usage and tobacco use    Objective:    BP 110/76   Pulse 87   Wt 229 lb (103.9 kg)   LMP 07/20/2016 (Approximate)   BMI 42.57 kg/m  General Appearance:    Alert, cooperative, no distress, appears stated age  Head:    Normocephalic, without obvious abnormality, atraumatic  Eyes:    PERRL, conjunctiva/corneas clear, EOM's intact, fundi    benign, both eyes  Ears:    Normal TM's and external ear canals, both ears  Nose:   Nares normal, septum midline, mucosa normal, no drainage    or sinus tenderness  Throat:   Lips, mucosa, and tongue normal; teeth and gums normal  Neck:   Supple, symmetrical, trachea midline, no adenopathy;    thyroid:  no enlargement/tenderness/nodules; no carotid   bruit or JVD  Back:     Symmetric, no curvature, ROM normal, no CVA tenderness  Lungs:     Clear to auscultation bilaterally, respirations unlabored  Chest Wall:    No tenderness or deformity   Heart:    Regular rate and rhythm, S1 and S2 normal, no murmur, rub   or gallop  Breast Exam:    No tenderness, masses, or nipple abnormality  Abdomen:     Soft, non-tender, bowel sounds active all four quadrants,    no  masses, no organomegaly  Genitalia:    Normal female without lesion, discharge or tenderness  Extremities:   Extremities normal, atraumatic, no cyanosis or edema  Pulses:   2+ and symmetric all extremities  Skin:   Skin color, texture, turgor normal, no rashes or lesions  Lymph nodes:   Cervical, supraclavicular, and axillary nodes normal  Neurologic:   CNII-XII intact, normal strength, sensation and reflexes    Throughout Cervix closed long.  FHR obtained with [place]: 165.        Lab Review Urine pregnancy test Labs reviewed yes Radiologic studies reviewed yes Assessment:    Pregnancy at 47w0dweeks ,Obsese,CHTN,TYPE2DM, transabdominal U/S   Plan:     Supervision of high risk pregnancy, antepartum - Plan:  Varicella zoster antibody, IgG, Hemoglobinopathy evaluation, VITAMIN D 25 Hydroxy (Vit-D Deficiency, Fractures), Culture, OB Urine, Cystic Fibrosis Mutation 97, Obstetric Panel, Including HIV, Cytology - PAP, Cervicovaginal ancillary only, labetalol (NORMODYNE) 200 MG tablet, AMB referral to maternal fetal medicine, AMB MFM GENETICS REFERRAL, [place] Fetal Nuchal Translucency, Prenat-FeAsp-Meth-FA-DHA w/o A (PRENATE PIXIE) 10-0.6-0.4-200 MG CAPS  Chronic hypertension - Plan: labetalol (NORMODYNE) 200 MG tablet, aspirin 81 MG chewable tablet, Protein / creatinine ratio, urine, Comp Met (CMET)  Type 2 diabetes mellitus without complication, without long-term current use of insulin (HCC)  S/P cholecystectomy  History of bladder repair surgery  H/O carpal tunnel repair  Obesity due to excess calories with serious comorbidity, unspecified classification   Prenatal vitamins.  Counseling provided regarding continued use of seat belts, cessation of alcohol consumption, smoking or use of illicit drugs; infection precautions i.e., influenza/TDAP immunizations, toxoplasmosis,CMV, parvovirus, listeria and varicella; workplace safety, exercise during pregnancy; routine dental care, safe medications, sexual activity, hot tubs, saunas, pools, travel, caffeine use, fish and methlymercury, potential toxins, hair treatments, varicose veins Weight gain recommendations per IOM guidelines reviewed: underweight/BMI< 18.5--> gain 28 - 40 lbs; normal weight/BMI 18.5 - 24.9--> gain 25 - 35 lbs; overweight/BMI 25 - 29.9--> gain 15 - 25 lbs; obese/BMI >30->gain  11 - 20 lbs Problem list reviewed and updated. FIRST/CF mutation testing/NIPT/QUAD SCREEN/fragile X/Ashkenazi Jewish population testing/Spinal muscular atrophy discussed: ordered. Role of ultrasound in pregnancy discussed; fetal survey: ordered. Amniocentesis discussed: declined. VBAC calculator score: VBAC consent form provided Meds ordered this encounter   Medications  . labetalol (NORMODYNE) 200 MG tablet    Sig: Take 1 tablet (200 mg total) by mouth 2 (two) times daily.    Dispense:  60 tablet    Refill:  3  . aspirin 81 MG chewable tablet    Sig: Chew 1 tablet (81 mg total) by mouth daily.    Dispense:  30 tablet    Refill:  12  . Prenat-FeAsp-Meth-FA-DHA w/o A (PRENATE PIXIE) 10-0.6-0.4-200 MG CAPS    Sig: Take 1 tablet by mouth daily.    Dispense:  30 capsule    Refill:  12    Please process coupon: Rx BIN: [id number] RxPCN: [id number], RxGRP:: [id number] RxID:: [id number] SUF: 01   Orders Placed This Encounter  Procedures  . Culture, OB Urine  . [place]FM Fetal Nuchal Translucency    Standing Status:   Future    Standing Expiration Date:   11/19/2017    Order Specific Question:   Reason for Exam (SYMPTOM  OR DIAGNOSIS REQUIRED)    Answer:   CHTN, DM2, AMA    Order Specific Question:   Preferred imaging location?    Answer:   [facility]-Ultrasound  . Varicella zoster antibody, IgG  .  Hemoglobinopathy evaluation  . VITAMIN D 25 Hydroxy (Vit-D Deficiency, Fractures)  . Cystic Fibrosis Mutation 97  . Obstetric Panel, Including HIV  . Protein / creatinine ratio, urine  . Comp Met (CMET)  . AMB referral to maternal fetal medicine    Referral Priority:   Routine    Referral Type:   Consultation    Referral Reason:   Specialty Services Required    Number of Visits Requested:   1  . AMB MFM GENETICS REFERRAL    Referral Priority:   Routine    Referral Type:   Consultation    Referral Reason:   Specialty Services Required    Number of Visits Requested:   1    Follow up in 4 weeks.  50% of 30 min visit spent on counseling and coordination of care.

## 2016-09-20 LAB — PROTEIN / CREATININE RATIO, URINE
CREATININE, UR: 156.9 mg/dL
PROTEIN UR: 20.3 mg/dL
PROTEIN/CREAT RATIO: 129 mg/g{creat} (ref 0–200)

## 2016-09-21 LAB — CERVICOVAGINAL ANCILLARY ONLY
Bacterial vaginitis: NEGATIVE
CANDIDA VAGINITIS: NEGATIVE
CHLAMYDIA, DNA PROBE: NEGATIVE
NEISSERIA GONORRHEA: NEGATIVE
Trichomonas: NEGATIVE

## 2016-09-23 LAB — URINE CULTURE, OB REFLEX

## 2016-09-23 LAB — CULTURE, OB URINE

## 2016-09-25 LAB — HEMOGLOBINOPATHY EVALUATION
HEMOGLOBIN A2 QUANTITATION: 2.2 % (ref 1.8–3.2)
HEMOGLOBIN F QUANTITATION: 0 % (ref 0.0–2.0)
HGB C: 0 %
HGB S: 0 %
HGB VARIANT: 0 %
Hgb A: 97.8 % (ref 96.4–98.8)

## 2016-09-25 LAB — OBSTETRIC PANEL, INCLUDING HIV
ANTIBODY SCREEN: NEGATIVE
Basophils Absolute: 0 10*3/uL (ref 0.0–0.2)
Basos: 0 %
EOS (ABSOLUTE): 0.1 10*3/uL (ref 0.0–0.4)
EOS: 1 %
HEMOGLOBIN: 12.9 g/dL (ref 11.1–15.9)
HIV SCREEN 4TH GENERATION: NONREACTIVE
Hematocrit: 39.4 % (ref 34.0–46.6)
Hepatitis B Surface Ag: NEGATIVE
IMMATURE GRANULOCYTES: 0 %
Immature Grans (Abs): 0 10*3/uL (ref 0.0–0.1)
LYMPHS ABS: 2.2 10*3/uL (ref 0.7–3.1)
Lymphs: 49 %
MCH: 28.9 pg (ref 26.6–33.0)
MCHC: 32.7 g/dL (ref 31.5–35.7)
MCV: 88 fL (ref 79–97)
MONOS ABS: 0.3 10*3/uL (ref 0.1–0.9)
Monocytes: 6 %
NEUTROS ABS: 2 10*3/uL (ref 1.4–7.0)
NEUTROS PCT: 44 %
Platelets: 311 10*3/uL (ref 150–379)
RBC: 4.47 x10E6/uL (ref 3.77–5.28)
RDW: 14.4 % (ref 12.3–15.4)
RH TYPE: POSITIVE
RPR Ser Ql: NONREACTIVE
Rubella Antibodies, IGG: 1.31 index (ref >0.99)
WBC: 4.5 10*3/uL (ref 3.4–10.8)

## 2016-09-25 LAB — COMPREHENSIVE METABOLIC PANEL
ALBUMIN: 4.1 g/dL (ref 3.5–5.5)
ALT: 23 IU/L (ref 0–32)
AST: 18 IU/L (ref 0–40)
Albumin/Globulin Ratio: 1.4 (ref 1.2–2.2)
Alkaline Phosphatase: 85 IU/L (ref 39–117)
BUN / CREAT RATIO: 9 (ref 9–23)
BUN: 6 mg/dL (ref 6–20)
Bilirubin Total: <0.2 mg/dL (ref 0.0–1.2)
CALCIUM: 9.5 mg/dL (ref 8.7–10.2)
CO2: 22 mmol/L (ref 18–29)
CREATININE: 0.64 mg/dL (ref 0.57–1.00)
Chloride: 99 mmol/L (ref 96–106)
GFR calc Af Amer: 132 mL/min/{1.73_m2} (ref >59)
GFR, EST NON AFRICAN AMERICAN: 114 mL/min/{1.73_m2} (ref >59)
GLOBULIN, TOTAL: 2.9 g/dL (ref 1.5–4.5)
Glucose: 84 mg/dL (ref 65–99)
Potassium: 4.1 mmol/L (ref 3.5–5.2)
SODIUM: 137 mmol/L (ref 134–144)
TOTAL PROTEIN: 7 g/dL (ref 6.0–8.5)

## 2016-09-25 LAB — VITAMIN D 25 HYDROXY (VIT D DEFICIENCY, FRACTURES): VIT D 25 HYDROXY: 10.5 ng/mL — AB (ref 30.0–100.0)

## 2016-09-25 LAB — CYTOLOGY - PAP: HPV (WINDOPATH): NOT DETECTED

## 2016-09-25 LAB — HEMOGLOBIN A1C
Est. average glucose Bld gHb Est-mCnc: 131 mg/dL
Hgb A1c MFr Bld: 6.2 % — ABNORMAL HIGH (ref 4.8–5.6)

## 2016-09-25 LAB — CYSTIC FIBROSIS MUTATION 97: Interpretation: NOT DETECTED

## 2016-09-25 LAB — VARICELLA ZOSTER ANTIBODY, IGG: Varicella zoster IgG: 2624 index (ref >165)

## 2016-09-30 ENCOUNTER — Other Ambulatory Visit: Payer: Self-pay | Admitting: Certified Nurse Midwife

## 2016-09-30 DIAGNOSIS — E119 Type 2 diabetes mellitus without complications: Secondary | ICD-10-CM

## 2016-09-30 DIAGNOSIS — O09529 Supervision of elderly multigravida, unspecified trimester: Secondary | ICD-10-CM | POA: Insufficient documentation

## 2016-09-30 DIAGNOSIS — R7989 Other specified abnormal findings of blood chemistry: Secondary | ICD-10-CM | POA: Insufficient documentation

## 2016-09-30 DIAGNOSIS — O0990 Supervision of high risk pregnancy, unspecified, unspecified trimester: Secondary | ICD-10-CM

## 2016-09-30 DIAGNOSIS — R87612 Low grade squamous intraepithelial lesion on cytologic smear of cervix (LGSIL): Secondary | ICD-10-CM

## 2016-09-30 DIAGNOSIS — R87619 Unspecified abnormal cytological findings in specimens from cervix uteri: Secondary | ICD-10-CM | POA: Insufficient documentation

## 2016-09-30 MED ORDER — VITAMIN D (ERGOCALCIFEROL) 1.25 MG (50000 UNIT) PO CAPS: 50000.0000 [IU] | ORAL_CAPSULE | ORAL | 2 refills | Status: DC

## 2016-10-17 ENCOUNTER — Ambulatory Visit (INDEPENDENT_AMBULATORY_CARE_PROVIDER_SITE_OTHER): Payer: Medicaid Other | Admitting: Obstetrics & Gynecology

## 2016-10-17 ENCOUNTER — Encounter (HOSPITAL_COMMUNITY): Payer: Self-pay

## 2016-10-17 ENCOUNTER — Ambulatory Visit (HOSPITAL_COMMUNITY)
Admission: RE | Admit: 2016-10-17 | Discharge: 2016-10-17 | Disposition: A | Discharge status: Home / Self Care | Payer: Medicaid Other | Source: Ambulatory Visit | Attending: Certified Nurse Midwife | Admitting: Certified Nurse Midwife

## 2016-10-17 ENCOUNTER — Encounter: Payer: Medicaid Other | Admitting: Certified Nurse Midwife

## 2016-10-17 ENCOUNTER — Other Ambulatory Visit: Payer: Self-pay | Admitting: Certified Nurse Midwife

## 2016-10-17 DIAGNOSIS — O09521 Supervision of elderly multigravida, first trimester: Secondary | ICD-10-CM | POA: Diagnosis not present

## 2016-10-17 DIAGNOSIS — Z3A13 13 weeks gestation of pregnancy: Secondary | ICD-10-CM

## 2016-10-17 DIAGNOSIS — E119 Type 2 diabetes mellitus without complications: Secondary | ICD-10-CM

## 2016-10-17 DIAGNOSIS — Z3682 Encounter for antenatal screening for nuchal translucency: Secondary | ICD-10-CM | POA: Insufficient documentation

## 2016-10-17 DIAGNOSIS — O10011 Pre-existing essential hypertension complicating pregnancy, first trimester: Secondary | ICD-10-CM

## 2016-10-17 DIAGNOSIS — O24919 Unspecified diabetes mellitus in pregnancy, unspecified trimester: Secondary | ICD-10-CM

## 2016-10-17 DIAGNOSIS — I10 Essential (primary) hypertension: Secondary | ICD-10-CM

## 2016-10-17 DIAGNOSIS — O24911 Unspecified diabetes mellitus in pregnancy, first trimester: Secondary | ICD-10-CM

## 2016-10-17 DIAGNOSIS — O24111 Pre-existing diabetes mellitus, type 2, in pregnancy, first trimester: Secondary | ICD-10-CM | POA: Diagnosis not present

## 2016-10-17 DIAGNOSIS — O10911 Unspecified pre-existing hypertension complicating pregnancy, first trimester: Secondary | ICD-10-CM

## 2016-10-17 DIAGNOSIS — O0990 Supervision of high risk pregnancy, unspecified, unspecified trimester: Secondary | ICD-10-CM

## 2016-10-17 DIAGNOSIS — O0991 Supervision of high risk pregnancy, unspecified, first trimester: Secondary | ICD-10-CM | POA: Diagnosis not present

## 2016-10-17 NOTE — Progress Notes (Signed)
   PRENATAL VISIT NOTE  Subjective:  Michelle Houston is a 36 y.o. [phone] at 2498w0d being seen today for ongoing prenatal care.  She is currently monitored for the following issues for this high-risk pregnancy and has Supervision of high risk pregnancy, antepartum; Chronic hypertension; DM (diabetes mellitus) (HCC); S/P cholecystectomy; History of bladder repair surgery; H/O carpal tunnel repair; Obese; Low vitamin D level; Abnormal Pap smear of cervix; AMA (advanced maternal age) multigravida 35+; and Diabetes mellitus affecting pregnancy, antepartum on her problem list.  Patient reports no complaints.  Contractions: Not present. Vag. Bleeding: None.  Movement: Absent. Denies leaking of fluid.   The following portions of the patient's history were reviewed and updated as appropriate: allergies, current medications, past family history, past medical history, past social history, past surgical history and problem list. Problem list updated.  Objective:   Vitals:   10/17/16 0903  BP: 121/81  Pulse: 87  Weight: 227 lb 6.4 oz (103.1 kg)    Fetal Status:     Movement: Absent     General:  Alert, oriented and cooperative. Patient is in no acute distress.  Skin: Skin is warm and dry. No rash noted.   Cardiovascular: Normal heart rate noted  Respiratory: Normal respiratory effort, no problems with respiration noted  Abdomen: Soft, gravid, appropriate for gestational age. Pain/Pressure: Present     Pelvic:  Cervical exam deferred        Extremities: Normal range of motion.  Edema: None  Mental Status: Normal mood and affect. Normal behavior. Normal judgment and thought content.   Assessment and Plan:  Pregnancy: [id number] at 2598w0d  1. Supervision of high risk pregnancy, antepartum AMA and DM, CHTN good control  3. Diabetes mellitus affecting pregnancy, antepartum No meds, needs to record FBS and 2 hr PP and RTC 2 weeks  4. Type 2 diabetes mellitus without complication, without long-term  current use of insulin (HCC) Anticipate start glyburide next visit but need to see her BG values  Preterm labor symptoms and general obstetric precautions including but not limited to vaginal bleeding, contractions, leaking of fluid and fetal movement were reviewed in detail with the patient. Please refer to After Visit Summary for other counseling recommendations.  Return in about 2 weeks (around 10/31/2016). First screen today  Scheryl DarterJames Arnold, MD

## 2016-10-17 NOTE — Progress Notes (Addendum)
Appointment Date: 10/17/2016 DOB: 1981-05-12 Referring Provider: Roe Coombsenney, Rachelle A, CNM Attending: Dr. Charlsie MerlesMark Newman  Michelle Houston was seen for genetic counseling because of a maternal age of 36 y.o..     In summary:  Discussed AMA and associated risk for fetal aneuploidy  Discussed options for screening - Panorama drawn today  First screen  Quad screen  NIPS  Ultrasound  Discussed diagnostic testing options - declined  [facility]  Amniocentesis  Reviewed family history concerns - see note  Discussed carrier screening options  CF - reported to be normal  SMA - declined  Hemoglobinopathies - reported to be normal  She was counseled regarding maternal age and the association with risk for chromosome conditions due to nondisjunction with aging of the ova.   We reviewed chromosomes, nondisjunction, and the associated 1 in 566 risk for fetal aneuploidy related to a maternal age of 36 y.o. at 7110w0d gestation.  She was counseled that the risk for aneuploidy decreases as gestational age increases, accounting for those pregnancies which spontaneously abort.  We specifically discussed Down syndrome (trisomy 8421), trisomies 4113 and 1218, and sex chromosome aneuploidies (47,XXX and 47,XXY) including the common features and prognoses of each.   We reviewed available screening options including First Screen, Quad screen, noninvasive prenatal screening (NIPS)/cell free DNA (cfDNA) screening, and detailed ultrasound.  She was counseled that screening tests are used to modify a patient's a priori risk for aneuploidy, typically based on age. This estimate provides a pregnancy specific risk assessment. We reviewed the benefits and limitations of each option. Specifically, we discussed the conditions for which each test screens, the detection rates, and false positive rates of each. She was also counseled regarding diagnostic testing via [facility] and amniocentesis. We reviewed the approximate 1 in 300-500  risk for complications from amniocentesis, including spontaneous pregnancy loss. We discussed the possible results that the tests might provide including: positive, negative, unanticipated, and no result. Finally, she was counseled regarding the cost of each option and potential out of pocket expenses.   After consideration of all the options, she elected to proceed with NIPS.  Those results will be available in 8-10 days.    A nuchal translucency ultrasound was also performed today.  The report will be documented separately.    Michelle Houston was provided with written information regarding cystic fibrosis (CF), spinal muscular atrophy (SMA) and hemoglobinopathies including the carrier frequency, availability of carrier screening and prenatal diagnosis if indicated.  In addition, we discussed that CF and hemoglobinopathies are routinely screened for as part of the [place] newborn screening panel and SMA is available as an opt-in.  After a discussion regarding her normal CF and normal hemoglobin electrophoresis, she declined screening for SMA .  Both family histories were reviewed and found to be noncontributory for birth defects and known genetic conditions. However, Michelle Houston reported that her oldest two children, both girls born to she and her first husband, have intellectual disabilities and autistic spectrum behaviors (although they have not been diagnosed with autistic spectrum disorder).  Michelle Houston reported that during the developmental evaluation for her daughters, that she was told that several other individuals on their father's side of the family have similar difficulties, including their father.  For this reason, Michelle Houston feels like it is not a risk factor for her current pregnancy because her current partner is different from the father of her oldest two daughters.  Without further information regarding the provided family history, an accurate genetic risk  cannot be calculated. Further genetic  counseling is warranted if more information is obtained.  Michelle Houston denied exposure to environmental toxins or chemical agents. She denied the use of alcohol or street drugs but reports smoking approximately 8 cigarettes per day.  She was encouraged in smoking cessation. She denied significant viral illnesses during the course of her pregnancy. Her medical and surgical histories were noncontributory.   I counseled Michelle Houston regarding the above risks and available options.  The approximate face-to-face time with the genetic counselor was 40 minutes.  Mady Gemma, MS,  Certified Genetic Counselor

## 2016-10-17 NOTE — Addendum Note (Signed)
Encounter addended by: Levonne HubertStalter, Zadie Deemer M on: 10/17/2016  2:32 PM<BR>    Actions taken: Imaging Exam ended

## 2016-10-17 NOTE — Patient Instructions (Signed)

## 2016-10-18 ENCOUNTER — Other Ambulatory Visit: Payer: Self-pay | Admitting: Certified Nurse Midwife

## 2016-10-18 DIAGNOSIS — O0990 Supervision of high risk pregnancy, unspecified, unspecified trimester: Secondary | ICD-10-CM

## 2016-10-22 ENCOUNTER — Ambulatory Visit: Payer: Medicaid Other | Admitting: Registered"

## 2016-10-23 ENCOUNTER — Other Ambulatory Visit: Payer: Self-pay | Admitting: Certified Nurse Midwife

## 2016-10-23 ENCOUNTER — Other Ambulatory Visit: Payer: Self-pay

## 2016-10-24 ENCOUNTER — Telehealth (HOSPITAL_COMMUNITY): Payer: Self-pay | Admitting: Genetics

## 2016-10-24 NOTE — Telephone Encounter (Signed)
Attempted to return [name]'s call regarding her cell free DNA screening results (Panorama).  There was no answer at either the home number she provided, or her cell phone.  Neither phone went to voicemail it just continued to ring more than 20 times.  I was unable to leave a message for a return call.

## 2016-10-24 NOTE — Telephone Encounter (Signed)
Called Michelle Houston to discuss her prenatal cell free DNA test results.  Mrs. Michelle Houston had Panorama screening through [facility].  Screening was offered because of AMA.   The patient was identified by name and DOB.  We reviewed that these are within normal limits, showing a less than 1 in 10,000 risk for trisomies 21, 18 and 13, and monosomy X (Turner syndrome).  In addition, the risk for triploidy and sex chromosome trisomies (47,XXX and 47,XXY) was also low risk.   We reviewed that this testing identifies > 99% of pregnancies with trisomy 6921, trisomy 313, sex chromosome trisomies (47,XXX and 47,XXY), and triploidy. The detection rate for trisomy 18 is 96%.  The detection rate for monosomy X is ~92%.  The false positive rate is <0.1% for all conditions. Screening was also consistent with femalefetal sex.  The patient did wish to know fetal sex.  She understands that this testing does not identify all genetic conditions.  All questions were answered to her satisfaction, she was encouraged to call with additional questions or concerns.  Mady Gemmaaragh Keisha Amer, MS Certified Genetic Counselor

## 2016-10-31 ENCOUNTER — Ambulatory Visit (INDEPENDENT_AMBULATORY_CARE_PROVIDER_SITE_OTHER): Payer: Medicaid Other | Admitting: Obstetrics & Gynecology

## 2016-10-31 VITALS — BP 116/80 | HR 91 | Wt 227.0 lb

## 2016-10-31 DIAGNOSIS — O0992 Supervision of high risk pregnancy, unspecified, second trimester: Secondary | ICD-10-CM

## 2016-10-31 DIAGNOSIS — I10 Essential (primary) hypertension: Secondary | ICD-10-CM | POA: Diagnosis not present

## 2016-10-31 DIAGNOSIS — O24919 Unspecified diabetes mellitus in pregnancy, unspecified trimester: Secondary | ICD-10-CM

## 2016-10-31 DIAGNOSIS — O24912 Unspecified diabetes mellitus in pregnancy, second trimester: Secondary | ICD-10-CM

## 2016-10-31 DIAGNOSIS — O0990 Supervision of high risk pregnancy, unspecified, unspecified trimester: Secondary | ICD-10-CM

## 2016-10-31 NOTE — Progress Notes (Signed)
Pt declines to attend N&D class d/t restricted work scheduled. CBG readings available today. Pt started back taking Metformin couple days ago.

## 2016-10-31 NOTE — Patient Instructions (Signed)
Second Trimester of Pregnancy The second trimester is from week 13 through week 28, month 4 through 6. This is often the time in pregnancy that you feel your best. Often times, morning sickness has lessened or quit. You may have more energy, and you may get hungry more often. Your unborn baby (fetus) is growing rapidly. At the end of the sixth month, he or she is about 9 inches long and weighs about 1 pounds. You will likely feel the baby move (quickening) between 18 and 20 weeks of pregnancy. Follow these instructions at home:  Avoid all smoking, herbs, and alcohol. Avoid drugs not approved by your doctor.  Do not use any tobacco products, including cigarettes, chewing tobacco, and electronic cigarettes. If you need help quitting, ask your doctor. You may get counseling or other support to help you quit.  Only take medicine as told by your doctor. Some medicines are safe and some are not during pregnancy.  Exercise only as told by your doctor. Stop exercising if you start having cramps.  Eat regular, healthy meals.  Wear a good support bra if your breasts are tender.  Do not use hot tubs, steam rooms, or saunas.  Wear your seat belt when driving.  Avoid raw meat, uncooked cheese, and liter boxes and soil used by cats.  Take your prenatal vitamins.  Take 1500-2000 milligrams of calcium daily starting at the 20th week of pregnancy until you deliver your baby.  Try taking medicine that helps you poop (stool softener) as needed, and if your doctor approves. Eat more fiber by eating fresh fruit, vegetables, and whole grains. Drink enough fluids to keep your pee (urine) clear or pale yellow.  Take warm water baths (sitz baths) to soothe pain or discomfort caused by hemorrhoids. Use hemorrhoid cream if your doctor approves.  If you have puffy, bulging veins (varicose veins), wear support hose. Raise (elevate) your feet for 15 minutes, 3-4 times a day. Limit salt in your diet.  Avoid heavy  lifting, wear low heals, and sit up straight.  Rest with your legs raised if you have leg cramps or low back pain.  Visit your dentist if you have not gone during your pregnancy. Use a soft toothbrush to brush your teeth. Be gentle when you floss.  You can have sex (intercourse) unless your doctor tells you not to.  Go to your doctor visits. Get help if:  You feel dizzy.  You have mild cramps or pressure in your lower belly (abdomen).  You have a nagging pain in your belly area.  You continue to feel sick to your stomach (nauseous), throw up (vomit), or have watery poop (diarrhea).  You have bad smelling fluid coming from your vagina.  You have pain with peeing (urination). Get help right away if:  You have a fever.  You are leaking fluid from your vagina.  You have spotting or bleeding from your vagina.  You have severe belly cramping or pain.  You lose or gain weight rapidly.  You have trouble catching your breath and have chest pain.  You notice sudden or extreme puffiness (swelling) of your face, hands, ankles, feet, or legs.  You have not felt the baby move in over an hour.  You have severe headaches that do not go away with medicine.  You have vision changes. This information is not intended to replace advice given to you by your health care provider. Make sure you discuss any questions you have with your health care   provider. Document Released: 08/01/2009 Document Revised: 10/13/2015 Document Reviewed: 07/08/2012 Elsevier Interactive Patient Education  2017 Elsevier Inc.  

## 2016-10-31 NOTE — Progress Notes (Signed)
   PRENATAL VISIT NOTE  Subjective:  Michelle Houston is a 36 y.o. [phone] at 805w0d being seen today for ongoing prenatal care.  She is currently monitored for the following issues for this high-risk pregnancy and has Supervision of high risk pregnancy, antepartum; Chronic hypertension; DM (diabetes mellitus) (HCC); S/P cholecystectomy; History of bladder repair surgery; H/O carpal tunnel repair; Obese; Low vitamin D level; Abnormal Pap smear of cervix; AMA (advanced maternal age) multigravida 35+; Diabetes mellitus affecting pregnancy, antepartum; and [redacted] weeks gestation of pregnancy on her problem list.  Patient reports has an irregular schedule, often does not eat breakfast and does not do QID BG determinaition.  Contractions: Not present. Vag. Bleeding: None.  Movement: Present. Denies leaking of fluid.   The following portions of the patient's history were reviewed and updated as appropriate: allergies, current medications, past family history, past medical history, past social history, past surgical history and problem list. Problem list updated.  Objective:   Vitals:   10/31/16 1030  BP: 116/80  Pulse: 91  Weight: 227 lb (103 kg)    Fetal Status: Fetal Heart Rate (bpm): 150   Movement: Present     General:  Alert, oriented and cooperative. Patient is in no acute distress.  Skin: Skin is warm and dry. No rash noted.   Cardiovascular: Normal heart rate noted  Respiratory: Normal respiratory effort, no problems with respiration noted  Abdomen: Soft, gravid, appropriate for gestational age. Pain/Pressure: Present     Pelvic:  Cervical exam deferred        Extremities: Normal range of motion.  Edema: None  Mental Status: Normal mood and affect. Normal behavior. Normal judgment and thought content.   Assessment and Plan:  Pregnancy: [id number] at 165w0d  1. Supervision of high risk pregnancy, antepartum Class B DM, FBS 122, just started her metformin again in last week. Stressed doing  BG FBS and 2 hr PP  2. Chronic hypertension Nl BP today  3. Diabetes mellitus affecting pregnancy, antepartum May consider change medication if more data to review Nv  Preterm labor symptoms and general obstetric precautions including but not limited to vaginal bleeding, contractions, leaking of fluid and fetal movement were reviewed in detail with the patient. Please refer to After Visit Summary for other counseling recommendations.  Return in about 2 weeks (around 11/14/2016). Declines AFP  Scheryl DarterJames Arnold, MD

## 2016-11-08 ENCOUNTER — Other Ambulatory Visit: Payer: Self-pay | Admitting: Certified Nurse Midwife

## 2016-11-08 DIAGNOSIS — O0990 Supervision of high risk pregnancy, unspecified, unspecified trimester: Secondary | ICD-10-CM

## 2016-11-14 ENCOUNTER — Ambulatory Visit (INDEPENDENT_AMBULATORY_CARE_PROVIDER_SITE_OTHER): Payer: Medicaid Other | Admitting: Obstetrics and Gynecology

## 2016-11-14 VITALS — BP 121/81 | HR 96 | Wt 226.0 lb

## 2016-11-14 DIAGNOSIS — O24912 Unspecified diabetes mellitus in pregnancy, second trimester: Secondary | ICD-10-CM

## 2016-11-14 DIAGNOSIS — O09522 Supervision of elderly multigravida, second trimester: Secondary | ICD-10-CM | POA: Diagnosis not present

## 2016-11-14 DIAGNOSIS — O24919 Unspecified diabetes mellitus in pregnancy, unspecified trimester: Secondary | ICD-10-CM

## 2016-11-14 DIAGNOSIS — O0992 Supervision of high risk pregnancy, unspecified, second trimester: Secondary | ICD-10-CM | POA: Diagnosis not present

## 2016-11-14 DIAGNOSIS — O10912 Unspecified pre-existing hypertension complicating pregnancy, second trimester: Secondary | ICD-10-CM

## 2016-11-14 DIAGNOSIS — O10919 Unspecified pre-existing hypertension complicating pregnancy, unspecified trimester: Secondary | ICD-10-CM | POA: Insufficient documentation

## 2016-11-14 DIAGNOSIS — O0990 Supervision of high risk pregnancy, unspecified, unspecified trimester: Secondary | ICD-10-CM

## 2016-11-14 MED ORDER — LANCET DEVICES MISC: 1.0000 | Freq: Four times a day (QID) | 11 refills | Status: DC

## 2016-11-14 MED ORDER — METFORMIN HCL 500 MG PO TABS: 1000.0000 mg | ORAL_TABLET | Freq: Two times a day (BID) | ORAL | 5 refills | Status: DC

## 2016-11-14 MED ORDER — GLUCOSE BLOOD VI STRP: ORAL_STRIP | 12 refills | Status: DC

## 2016-11-14 NOTE — Progress Notes (Signed)
Subjective:  Michelle Houston is a 36 y.o. [phone] at 6347w0d being seen today for ongoing prenatal care.  She is currently monitored for the following issues for this high-risk pregnancy and has Supervision of high risk pregnancy, antepartum; Chronic hypertension; DM (diabetes mellitus) (HCC); S/P cholecystectomy; History of bladder repair surgery; H/O carpal tunnel repair; Obese; Low vitamin D level; Abnormal Pap smear of cervix; AMA (advanced maternal age) multigravida 35+; Diabetes mellitus affecting pregnancy, antepartum; and Chronic hypertension during pregnancy, antepartum on her problem list.  Patient reports no complaints.  Contractions: Not present. Vag. Bleeding: None.  Movement: Present. Denies leaking of fluid.   The following portions of the patient's history were reviewed and updated as appropriate: allergies, current medications, past family history, past medical history, past social history, past surgical history and problem list. Problem list updated.  Objective:   Vitals:   11/14/16 1621  BP: 121/81  Pulse: 96  Weight: 226 lb (102.5 kg)    Fetal Status: Fetal Heart Rate (bpm): 152   Movement: Present     General:  Alert, oriented and cooperative. Patient is in no acute distress.  Skin: Skin is warm and dry. No rash noted.   Cardiovascular: Normal heart rate noted  Respiratory: Normal respiratory effort, no problems with respiration noted  Abdomen: Soft, gravid, appropriate for gestational age. Pain/Pressure: Absent     Pelvic:  Cervical exam deferred        Extremities: Normal range of motion.  Edema: None  Mental Status: Normal mood and affect. Normal behavior. Normal judgment and thought content.   Urinalysis:      Assessment and Plan:  Pregnancy: [id number] at 7147w0d  1. Diabetes mellitus affecting pregnancy, antepartum BS remain uncontrolled. Pt not following diet, drink soft drinks and sweet tea. Missed Diabetic educator appt Discussed importance of following  diet, stopping soft drinks and sweet tea, monitoring BS. Will increase Metformin to 1000 mg bid.  Reschedule Diabetic educator appt Other referrals for fetal ECHO and Opth made.  DM and impact on pregnancy discussed with pt Will f/u in 2 weeks   - Ambulatory referral to Ophthalmology - TSH - US Fetal Echocardiography; Future - Lanc[employer] Devices MISC; 1 Device by Does not apply route 4 (four) times daily.  Dispense: 100 each; Refill: 11 - glucose blood (ACCU-CHEK GUIDE) test strip; Use as instructed  Dispense: 100 each; Refill: 12 - metFORMIN (GLUCOPHAGE) 500 MG tablet; Take 2 tablets (1,000 mg total) by mouth 2 (two) times daily with a meal.  Dispense: 60 tablet; Refill: 5  2. Supervision of high risk pregnancy, antepartum Stable - AFP, Serum, Open Spina Bifida  3. Elderly multigravida in second trimester Panorama, low risk AFP today  4. Chronic hypertension during pregnancy, antepartum Stable Continue with BASA and labetalol.  Preterm labor symptoms and general obstetric precautions including but not limited to vaginal bleeding, contractions, leaking of fluid and fetal movement were reviewed in detail with the patient. Please refer to After Visit Summary for other counseling recommendations.  Return in about 2 weeks (around 11/28/2016) for OB visit.   Hermina StaggersErvin, Raye Wiens L, MD

## 2016-11-20 ENCOUNTER — Other Ambulatory Visit: Payer: Self-pay | Admitting: *Deleted

## 2016-11-20 DIAGNOSIS — O24919 Unspecified diabetes mellitus in pregnancy, unspecified trimester: Secondary | ICD-10-CM

## 2016-11-20 MED ORDER — GLYBURIDE 2.5 MG PO TABS: 2.5000 mg | ORAL_TABLET | Freq: Two times a day (BID) | ORAL | 5 refills | Status: DC

## 2016-11-20 NOTE — Progress Notes (Signed)
Spoke with pt about Metformin. Pt states she has had diarrhea since starting and today she is not feeling well.  Pt states she is able to leave work today due to problem.  Pt advised to go home, rest, eat/drink as tolerated.   Pt made aware will discuss with Dr Alysia PennaErvin today to determine plan.  Per Dr Alysia PennaErvin, stop Metformin and order Glyburide 2.5mg  BID. Pt to record BS and report them to office in 1 week.  Pt made aware of change in Rx's and to keep record of glucose readings and bring to appt next week to review with provider. Pt advised to call office sooner if any problems with changes in Rx.   Pt states understanding.

## 2016-11-27 LAB — AFP, SERUM, OPEN SPINA BIFIDA
AFP MoM: 0.9
AFP VALUE AFPOSL: 23.2 ng/mL
Gest. Age on Collection Date: 17 weeks
MATERNAL AGE AT EDD: 37.6 a
OSBR Risk 1 IN: 10000
Test Results:: NEGATIVE
Weight: 226 [lb_av]

## 2016-11-27 LAB — TSH: TSH: 0.65 u[IU]/mL (ref 0.450–4.500)

## 2016-11-28 ENCOUNTER — Ambulatory Visit (INDEPENDENT_AMBULATORY_CARE_PROVIDER_SITE_OTHER): Payer: Medicaid Other | Admitting: Obstetrics & Gynecology

## 2016-11-28 VITALS — BP 116/71 | HR 92 | Wt 228.2 lb

## 2016-11-28 DIAGNOSIS — O10919 Unspecified pre-existing hypertension complicating pregnancy, unspecified trimester: Secondary | ICD-10-CM

## 2016-11-28 DIAGNOSIS — E119 Type 2 diabetes mellitus without complications: Secondary | ICD-10-CM

## 2016-11-28 DIAGNOSIS — O24919 Unspecified diabetes mellitus in pregnancy, unspecified trimester: Secondary | ICD-10-CM

## 2016-11-28 DIAGNOSIS — E6609 Other obesity due to excess calories: Secondary | ICD-10-CM

## 2016-11-28 DIAGNOSIS — O24912 Unspecified diabetes mellitus in pregnancy, second trimester: Secondary | ICD-10-CM

## 2016-11-28 DIAGNOSIS — O10912 Unspecified pre-existing hypertension complicating pregnancy, second trimester: Secondary | ICD-10-CM

## 2016-11-28 DIAGNOSIS — O0992 Supervision of high risk pregnancy, unspecified, second trimester: Secondary | ICD-10-CM

## 2016-11-28 DIAGNOSIS — O0990 Supervision of high risk pregnancy, unspecified, unspecified trimester: Secondary | ICD-10-CM

## 2016-11-28 MED ORDER — GLYBURIDE 2.5 MG PO TABS: 2.5000 mg | ORAL_TABLET | Freq: Every day | ORAL | 5 refills | Status: DC

## 2016-11-28 MED ORDER — GLUCOSE BLOOD VI STRP: ORAL_STRIP | 12 refills | Status: DC

## 2016-11-28 NOTE — Progress Notes (Signed)
   PRENATAL VISIT NOTE  Subjective:  Wilder GladeCrystal G Trudo is a 36 y.o. [phone] at 4348w0d being seen today for ongoing prenatal care.  She is currently monitored for the following issues for this high-risk pregnancy and has Supervision of high risk pregnancy, antepartum; Chronic hypertension; DM (diabetes mellitus) (HCC); S/P cholecystectomy; History of bladder repair surgery; H/O carpal tunnel repair; Obese; Low vitamin D level; Abnormal Pap smear of cervix; AMA (advanced maternal age) multigravida 35+; Diabetes mellitus affecting pregnancy, antepartum; and Chronic hypertension during pregnancy, antepartum on her problem list.  Patient reports no complaints.  Contractions: Not present. Vag. Bleeding: None.  Movement: Present. Denies leaking of fluid.   The following portions of the patient's history were reviewed and updated as appropriate: allergies, current medications, past family history, past medical history, past social history, past surgical history and problem list. Problem list updated.  Objective:   Vitals:   11/28/16 1641  BP: 116/71  Pulse: 92  Weight: 228 lb 3.2 oz (103.5 kg)    Fetal Status:     Movement: Present     General:  Alert, oriented and cooperative. Patient is in no acute distress.  Skin: Skin is warm and dry. No rash noted.   Cardiovascular: Normal heart rate noted  Respiratory: Normal respiratory effort, no problems with respiration noted  Abdomen: Soft, gravid, appropriate for gestational age. Pain/Pressure: Absent     Pelvic:  Cervical exam deferred        Extremities: Normal range of motion.  Edema: None  Mental Status: Normal mood and affect. Normal behavior. Normal judgment and thought content.   Assessment and Plan:  Pregnancy: [id number] at 6548w0d  1. Diabetes mellitus affecting pregnancy, antepartum BG wnl >80% on glyburide, change to QHS  2. Supervision of high risk pregnancy, antepartum   3. Chronic hypertension during pregnancy,  antepartum nlBP  4. Type 2 diabetes mellitus without complication, without long-term current use of insulin (HCC)  - glucose blood (ACCU-CHEK AVIVA PLUS) test strip; Use as instructed  Dispense: 100 each; Refill: 12  5. Obesity due to excess calories with serious comorbidity, unspecified classification    general obstetric precautions including but not limited to vaginal bleeding, contractions, leaking of fluid and fetal movement were reviewed in detail with the patient. Please refer to After Visit Summary for other counseling recommendations.  Return in about 2 weeks (around 12/12/2016). US next week  Scheryl DarterJames Arnold, MD

## 2016-11-28 NOTE — Patient Instructions (Signed)

## 2016-12-05 ENCOUNTER — Other Ambulatory Visit: Payer: Self-pay | Admitting: Obstetrics & Gynecology

## 2016-12-05 ENCOUNTER — Encounter (HOSPITAL_COMMUNITY): Payer: Self-pay

## 2016-12-05 ENCOUNTER — Other Ambulatory Visit (HOSPITAL_COMMUNITY): Payer: Self-pay

## 2016-12-05 ENCOUNTER — Other Ambulatory Visit (HOSPITAL_COMMUNITY): Payer: Self-pay | Admitting: *Deleted

## 2016-12-05 ENCOUNTER — Ambulatory Visit (HOSPITAL_COMMUNITY)
Admission: RE | Admit: 2016-12-05 | Discharge: 2016-12-05 | Disposition: A | Discharge status: Home / Self Care | Payer: Medicaid Other | Source: Ambulatory Visit | Attending: Obstetrics & Gynecology | Admitting: Obstetrics & Gynecology

## 2016-12-05 DIAGNOSIS — O99212 Obesity complicating pregnancy, second trimester: Secondary | ICD-10-CM | POA: Diagnosis not present

## 2016-12-05 DIAGNOSIS — O24112 Pre-existing diabetes mellitus, type 2, in pregnancy, second trimester: Secondary | ICD-10-CM | POA: Insufficient documentation

## 2016-12-05 DIAGNOSIS — O09522 Supervision of elderly multigravida, second trimester: Secondary | ICD-10-CM

## 2016-12-05 DIAGNOSIS — O10012 Pre-existing essential hypertension complicating pregnancy, second trimester: Secondary | ICD-10-CM | POA: Insufficient documentation

## 2016-12-05 DIAGNOSIS — Z3689 Encounter for other specified antenatal screening: Secondary | ICD-10-CM | POA: Insufficient documentation

## 2016-12-05 DIAGNOSIS — O0990 Supervision of high risk pregnancy, unspecified, unspecified trimester: Secondary | ICD-10-CM

## 2016-12-05 DIAGNOSIS — Z3A20 20 weeks gestation of pregnancy: Secondary | ICD-10-CM

## 2016-12-05 DIAGNOSIS — O10919 Unspecified pre-existing hypertension complicating pregnancy, unspecified trimester: Secondary | ICD-10-CM

## 2016-12-05 DIAGNOSIS — O10019 Pre-existing essential hypertension complicating pregnancy, unspecified trimester: Secondary | ICD-10-CM

## 2016-12-12 ENCOUNTER — Encounter: Payer: Self-pay | Admitting: Obstetrics and Gynecology

## 2016-12-12 ENCOUNTER — Ambulatory Visit (INDEPENDENT_AMBULATORY_CARE_PROVIDER_SITE_OTHER): Payer: Medicaid Other | Admitting: Obstetrics and Gynecology

## 2016-12-12 VITALS — BP 118/69 | HR 86 | Wt 232.8 lb

## 2016-12-12 DIAGNOSIS — O09522 Supervision of elderly multigravida, second trimester: Secondary | ICD-10-CM

## 2016-12-12 DIAGNOSIS — O0990 Supervision of high risk pregnancy, unspecified, unspecified trimester: Secondary | ICD-10-CM

## 2016-12-12 DIAGNOSIS — O10919 Unspecified pre-existing hypertension complicating pregnancy, unspecified trimester: Secondary | ICD-10-CM

## 2016-12-12 DIAGNOSIS — O24919 Unspecified diabetes mellitus in pregnancy, unspecified trimester: Secondary | ICD-10-CM

## 2016-12-12 DIAGNOSIS — I10 Essential (primary) hypertension: Secondary | ICD-10-CM

## 2016-12-12 MED ORDER — ASPIRIN 81 MG PO CHEW: 81.0000 mg | CHEWABLE_TABLET | Freq: Every day | ORAL | 12 refills | Status: DC

## 2016-12-12 NOTE — Progress Notes (Signed)
Subjective:  Michelle Houston is a 36 y.o. [phone] at 4431w0d being seen today for ongoing prenatal care.  She is currently monitored for the following issues for this high-risk pregnancy and has Supervision of high risk pregnancy, antepartum; Chronic hypertension; DM (diabetes mellitus) (HCC); S/P cholecystectomy; History of bladder repair surgery; H/O carpal tunnel repair; Obese; Low vitamin D level; Abnormal Pap smear of cervix; AMA (advanced maternal age) multigravida 35+; Diabetes mellitus affecting pregnancy, antepartum; and Chronic hypertension during pregnancy, antepartum on her problem list.  Patient reports no complaints.  Contractions: Not present. Vag. Bleeding: None.  Movement: Present. Denies leaking of fluid.   The following portions of the patient's history were reviewed and updated as appropriate: allergies, current medications, past family history, past medical history, past social history, past surgical history and problem list. Problem list updated.  Objective:   Vitals:   12/12/16 0924  BP: 118/69  Pulse: 86  Weight: 232 lb 12.8 oz (105.6 kg)    Fetal Status: Fetal Heart Rate (bpm): 148   Movement: Present     General:  Alert, oriented and cooperative. Patient is in no acute distress.  Skin: Skin is warm and dry. No rash noted.   Cardiovascular: Normal heart rate noted  Respiratory: Normal respiratory effort, no problems with respiration noted  Abdomen: Soft, gravid, appropriate for gestational age. Pain/Pressure: Absent     Pelvic:  Cervical exam deferred        Extremities: Normal range of motion.  Edema: None  Mental Status: Normal mood and affect. Normal behavior. Normal judgment and thought content.   Urinalysis:      Assessment and Plan:  Pregnancy: [id number] at 5731w0d  1. Supervision of high risk pregnancy, antepartum Stable   2. Elderly multigravida in second trimester Nl AFP  3. Diabetes mellitus affecting pregnancy, antepartum BS in goal range per pt  report Forgot readings Stressed importance of brining readings to OB appts ECHO and Opth appts made  4. Chronic hypertension during pregnancy, antepartum Stable Continue with Labetalol and  BASA  Preterm labor symptoms and general obstetric precautions including but not limited to vaginal bleeding, contractions, leaking of fluid and fetal movement were reviewed in detail with the patient. Please refer to After Visit Summary for other counseling recommendations.  Return in about 3 weeks (around 01/02/2017) for OB visit.   Hermina StaggersErvin, [name], MD

## 2017-01-02 ENCOUNTER — Ambulatory Visit (HOSPITAL_COMMUNITY)
Admission: RE | Admit: 2017-01-02 | Discharge: 2017-01-02 | Disposition: A | Discharge status: Home / Self Care | Payer: Medicaid Other | Source: Ambulatory Visit | Attending: Obstetrics & Gynecology | Admitting: Obstetrics & Gynecology

## 2017-01-02 ENCOUNTER — Encounter (HOSPITAL_COMMUNITY): Payer: Self-pay

## 2017-01-02 ENCOUNTER — Other Ambulatory Visit (HOSPITAL_COMMUNITY): Payer: Self-pay | Admitting: Maternal and Fetal Medicine

## 2017-01-02 DIAGNOSIS — O99212 Obesity complicating pregnancy, second trimester: Secondary | ICD-10-CM | POA: Diagnosis not present

## 2017-01-02 DIAGNOSIS — O10912 Unspecified pre-existing hypertension complicating pregnancy, second trimester: Secondary | ICD-10-CM | POA: Insufficient documentation

## 2017-01-02 DIAGNOSIS — O10919 Unspecified pre-existing hypertension complicating pregnancy, unspecified trimester: Secondary | ICD-10-CM

## 2017-01-02 DIAGNOSIS — O09522 Supervision of elderly multigravida, second trimester: Secondary | ICD-10-CM

## 2017-01-02 DIAGNOSIS — O24112 Pre-existing diabetes mellitus, type 2, in pregnancy, second trimester: Secondary | ICD-10-CM | POA: Diagnosis not present

## 2017-01-02 DIAGNOSIS — Z362 Encounter for other antenatal screening follow-up: Secondary | ICD-10-CM | POA: Diagnosis not present

## 2017-01-02 DIAGNOSIS — Z3A24 24 weeks gestation of pregnancy: Secondary | ICD-10-CM

## 2017-01-02 DIAGNOSIS — O10012 Pre-existing essential hypertension complicating pregnancy, second trimester: Secondary | ICD-10-CM | POA: Diagnosis not present

## 2017-01-09 ENCOUNTER — Encounter: Payer: Self-pay | Admitting: Certified Nurse Midwife

## 2017-01-09 ENCOUNTER — Ambulatory Visit (INDEPENDENT_AMBULATORY_CARE_PROVIDER_SITE_OTHER): Payer: Medicaid Other | Admitting: Certified Nurse Midwife

## 2017-01-09 VITALS — BP 122/72 | HR 93 | Wt 226.5 lb

## 2017-01-09 DIAGNOSIS — O24919 Unspecified diabetes mellitus in pregnancy, unspecified trimester: Secondary | ICD-10-CM

## 2017-01-09 DIAGNOSIS — R7989 Other specified abnormal findings of blood chemistry: Secondary | ICD-10-CM

## 2017-01-09 DIAGNOSIS — O0990 Supervision of high risk pregnancy, unspecified, unspecified trimester: Secondary | ICD-10-CM

## 2017-01-09 DIAGNOSIS — E6609 Other obesity due to excess calories: Secondary | ICD-10-CM

## 2017-01-09 DIAGNOSIS — O09522 Supervision of elderly multigravida, second trimester: Secondary | ICD-10-CM

## 2017-01-09 DIAGNOSIS — O10912 Unspecified pre-existing hypertension complicating pregnancy, second trimester: Secondary | ICD-10-CM

## 2017-01-09 DIAGNOSIS — O10919 Unspecified pre-existing hypertension complicating pregnancy, unspecified trimester: Secondary | ICD-10-CM

## 2017-01-09 DIAGNOSIS — O0992 Supervision of high risk pregnancy, unspecified, second trimester: Secondary | ICD-10-CM

## 2017-01-09 DIAGNOSIS — O24912 Unspecified diabetes mellitus in pregnancy, second trimester: Secondary | ICD-10-CM

## 2017-01-09 MED ORDER — ACCU-CHEK FASTCLIX LANCETS MISC: 1.0000 "application " | Freq: Three times a day (TID) | 12 refills | Status: DC

## 2017-01-09 MED ORDER — GLUCOSE BLOOD VI STRP: ORAL_STRIP | 12 refills | Status: DC

## 2017-01-09 NOTE — Progress Notes (Signed)
PRENATAL VISIT NOTE  Subjective:  Michelle Houston is a 36 y.o. [phone] at [redacted]w[redacted]d being seen today for ongoing prenatal care.  She is currently monitored for the following issues for this high-risk pregnancy and has Supervision of high risk pregnancy, antepartum; Chronic hypertension; DM (diabetes mellitus) (HCC); S/P cholecystectomy; History of bladder repair surgery; H/O carpal tunnel repair; Obese; Low vitamin D level; Abnormal Pap smear of cervix; AMA (advanced maternal age) multigravida 35+; Diabetes mellitus affecting pregnancy, antepartum; and Chronic hypertension during pregnancy, antepartum on her problem list.  Patient reports no complaints.  Contractions: Not present. Vag. Bleeding: None.  Movement: Present. Denies leaking of fluid.   The following portions of the patient's history were reviewed and updated as appropriate: allergies, current medications, past family history, past medical history, past social history, past surgical history and problem list. Problem list updated.  Objective:   Vitals:   01/09/17 0924  BP: 122/72  Pulse: 93  Weight: 226 lb 8 oz (102.7 kg)    Fetal Status: Fetal Heart Rate (bpm): 150 Fundal Height: 28 cm Movement: Present     General:  Alert, oriented and cooperative. Patient is in no acute distress.  Skin: Skin is warm and dry. No rash noted.   Cardiovascular: Normal heart rate noted  Respiratory: Normal respiratory effort, no problems with respiration noted  Abdomen: Soft, gravid, appropriate for gestational age.  Pain/Pressure: Absent     Pelvic: Cervical exam deferred        Extremities: Normal range of motion.  Edema: None  Mental Status:  Normal mood and affect. Normal behavior. Normal judgment and thought content.   Assessment and Plan:  Pregnancy: G5P3013 at [redacted]w[redacted]d  1. Chronic hypertension during pregnancy, antepartum     Stable on Labetalol 200 mg BID, on baby ASA  2. Diabetes mellitus affecting pregnancy, antepartum      DM  teaching refused, took previously.      According to patient old meter is not working, [employer] given to patient and educated on how to use it.  States that she is doing better with her diet and has decreased her intake of sugary beverages, eats mostly vegetables and meats.  Is currently taking Glyburide 2.5 mg at HS.   - glucose blood (ACCU-CHEK GUIDE) test strip; Use as instructed  Dispense: 100 each; Refill: 12 - ACCU-CHEK FASTCLIX LANCETS MISC; 1 application by Does not apply route 4 (four) times daily -  with meals and at bedtime.  Dispense: 102 each; Refill: 12  3. Supervision of high risk pregnancy, antepartum     Medical non-compliance with DM teaching, GDM diet, and glucose monitoring.  States that she will use the app for the meter she was given today on her phone and bring it with her to her next appointment.  Has eye appointment and fetal echo scheduled.    4. Obesity due to excess calories with serious comorbidity, unspecified classification     Total weight gain this pregnancy: 3 lbs.   5. Low vitamin D level     Taking weekly vitamin D  6. Elderly multigravida in second trimester     < 40 years, has f/u US scheduled with MFM for growth d/t DM & CHTN.    Preterm labor symptoms and general obstetric precautions including but not limited to vaginal bleeding, contractions, leaking of fluid and fetal movement were reviewed in detail with the patient. Please refer to After Visit Summary for other counseling recommendations.  Return in about 2  weeks (around 01/23/2017) for [facility], Needs to see FP MD here.   Roe Coombs, CNM

## 2017-01-19 ENCOUNTER — Other Ambulatory Visit: Payer: Self-pay | Admitting: Certified Nurse Midwife

## 2017-01-19 DIAGNOSIS — I10 Essential (primary) hypertension: Secondary | ICD-10-CM

## 2017-01-19 DIAGNOSIS — O0990 Supervision of high risk pregnancy, unspecified, unspecified trimester: Secondary | ICD-10-CM

## 2017-01-23 ENCOUNTER — Encounter: Payer: Self-pay | Admitting: Obstetrics

## 2017-01-23 ENCOUNTER — Ambulatory Visit (INDEPENDENT_AMBULATORY_CARE_PROVIDER_SITE_OTHER): Payer: Medicaid Other | Admitting: Obstetrics

## 2017-01-23 VITALS — BP 134/83 | HR 98 | Wt 225.0 lb

## 2017-01-23 DIAGNOSIS — O24919 Unspecified diabetes mellitus in pregnancy, unspecified trimester: Secondary | ICD-10-CM

## 2017-01-23 DIAGNOSIS — E669 Obesity, unspecified: Secondary | ICD-10-CM

## 2017-01-23 DIAGNOSIS — Z9889 Other specified postprocedural states: Secondary | ICD-10-CM

## 2017-01-23 DIAGNOSIS — O09522 Supervision of elderly multigravida, second trimester: Secondary | ICD-10-CM

## 2017-01-23 DIAGNOSIS — O0992 Supervision of high risk pregnancy, unspecified, second trimester: Secondary | ICD-10-CM

## 2017-01-23 DIAGNOSIS — I10 Essential (primary) hypertension: Secondary | ICD-10-CM

## 2017-01-23 DIAGNOSIS — O0990 Supervision of high risk pregnancy, unspecified, unspecified trimester: Secondary | ICD-10-CM

## 2017-01-23 DIAGNOSIS — O99212 Obesity complicating pregnancy, second trimester: Secondary | ICD-10-CM

## 2017-01-23 DIAGNOSIS — O24912 Unspecified diabetes mellitus in pregnancy, second trimester: Secondary | ICD-10-CM

## 2017-01-23 DIAGNOSIS — E6609 Other obesity due to excess calories: Secondary | ICD-10-CM

## 2017-01-23 DIAGNOSIS — O09529 Supervision of elderly multigravida, unspecified trimester: Secondary | ICD-10-CM

## 2017-01-23 MED ORDER — LABETALOL HCL 200 MG PO TABS: 200.0000 mg | ORAL_TABLET | Freq: Two times a day (BID) | ORAL | 3 refills | Status: DC

## 2017-01-23 NOTE — Progress Notes (Signed)
Patient reports she is doing well with her glucose levels.

## 2017-01-23 NOTE — Progress Notes (Signed)
Subjective:  Michelle Houston is a 36 y.o. [phone] at 3537w0d being seen today for ongoing prenatal care.  She is currently monitored for the following issues for this high-risk pregnancy and has Supervision of high risk pregnancy, antepartum; Chronic hypertension; DM (diabetes mellitus) (HCC); S/P cholecystectomy; History of bladder repair surgery; H/O carpal tunnel repair; Obese; Low vitamin D level; Abnormal Pap smear of cervix; AMA (advanced maternal age) multigravida 35+; Diabetes mellitus affecting pregnancy, antepartum; and Chronic hypertension during pregnancy, antepartum on her problem list.  Patient reports bilateral wrist pain and upper arm pain.  Contractions: Not present. Vag. Bleeding: None.  Movement: Present. Denies leaking of fluid.   The following portions of the patient's history were reviewed and updated as appropriate: allergies, current medications, past family history, past medical history, past social history, past surgical history and problem list. Problem list updated.  Objective:   Vitals:   01/23/17 1056  BP: 134/83  Pulse: 98  Weight: 225 lb (102.1 kg)    Fetal Status: Fetal Heart Rate (bpm): 150   Movement: Present     General:  Alert, oriented and cooperative. Patient is in no acute distress.  Skin: Skin is warm and dry. No rash noted.   Cardiovascular: Normal heart rate noted  Respiratory: Normal respiratory effort, no problems with respiration noted  Abdomen: Soft, gravid, appropriate for gestational age. Pain/Pressure: Absent     Pelvic:  Cervical exam deferred        Extremities: Normal range of motion.  Edema: None  Mental Status: Normal mood and affect. Normal behavior. Normal judgment and thought content.   Urinalysis: Urine Protein: Trace Urine Glucose: Negative  Assessment and Plan:  Pregnancy: G5P3013 at 4937w0d  1. Supervision of high risk pregnancy, antepartum  2. Supervision of elderly multigravida, antepartum   3. Chronic  hypertension Rx: - labetalol (NORMODYNE) 200 MG tablet; Take 1 tablet (200 mg total) by mouth 2 (two) times daily.  Dispense: 60 tablet; Refill: 3  4. Diabetes mellitus affecting pregnancy, antepartum.  GDM.  Diet Controlled - accu-checks are in stable range  5. Obesity due to excess calories with serious comorbidity, unspecified classification   Preterm labor symptoms and general obstetric precautions including but not limited to vaginal bleeding, contractions, leaking of fluid and fetal movement were reviewed in detail with the patient. Please refer to After Visit Summary for other counseling recommendations.  Return in about 2 weeks (around 02/06/2017) for ROB.   Brock BadHarper, Charles A, MD

## 2017-01-30 ENCOUNTER — Ambulatory Visit (HOSPITAL_COMMUNITY)
Admission: RE | Admit: 2017-01-30 | Discharge: 2017-01-30 | Disposition: A | Discharge status: Home / Self Care | Payer: Medicaid Other | Source: Ambulatory Visit | Attending: Obstetrics & Gynecology | Admitting: Obstetrics & Gynecology

## 2017-01-30 ENCOUNTER — Encounter (HOSPITAL_COMMUNITY): Payer: Self-pay

## 2017-01-30 ENCOUNTER — Other Ambulatory Visit (HOSPITAL_COMMUNITY): Payer: Self-pay | Admitting: *Deleted

## 2017-01-30 ENCOUNTER — Other Ambulatory Visit (HOSPITAL_COMMUNITY): Payer: Self-pay | Admitting: Maternal and Fetal Medicine

## 2017-01-30 DIAGNOSIS — O09893 Supervision of other high risk pregnancies, third trimester: Secondary | ICD-10-CM | POA: Insufficient documentation

## 2017-01-30 DIAGNOSIS — Z3A28 28 weeks gestation of pregnancy: Secondary | ICD-10-CM | POA: Diagnosis not present

## 2017-01-30 DIAGNOSIS — O24013 Pre-existing diabetes mellitus, type 1, in pregnancy, third trimester: Secondary | ICD-10-CM

## 2017-01-30 DIAGNOSIS — Z7984 Long term (current) use of oral hypoglycemic drugs: Principal | ICD-10-CM

## 2017-01-30 DIAGNOSIS — O09523 Supervision of elderly multigravida, third trimester: Secondary | ICD-10-CM | POA: Diagnosis present

## 2017-01-30 DIAGNOSIS — Z3689 Encounter for other specified antenatal screening: Secondary | ICD-10-CM | POA: Insufficient documentation

## 2017-01-30 DIAGNOSIS — E669 Obesity, unspecified: Secondary | ICD-10-CM | POA: Insufficient documentation

## 2017-01-30 DIAGNOSIS — O10919 Unspecified pre-existing hypertension complicating pregnancy, unspecified trimester: Secondary | ICD-10-CM

## 2017-01-30 DIAGNOSIS — O24113 Pre-existing diabetes mellitus, type 2, in pregnancy, third trimester: Secondary | ICD-10-CM | POA: Diagnosis not present

## 2017-01-30 DIAGNOSIS — O99213 Obesity complicating pregnancy, third trimester: Secondary | ICD-10-CM

## 2017-01-30 DIAGNOSIS — O10013 Pre-existing essential hypertension complicating pregnancy, third trimester: Secondary | ICD-10-CM | POA: Insufficient documentation

## 2017-01-30 DIAGNOSIS — O24119 Pre-existing diabetes mellitus, type 2, in pregnancy, unspecified trimester: Secondary | ICD-10-CM

## 2017-02-06 ENCOUNTER — Ambulatory Visit (INDEPENDENT_AMBULATORY_CARE_PROVIDER_SITE_OTHER): Payer: Medicaid Other | Admitting: Obstetrics

## 2017-02-06 ENCOUNTER — Encounter: Payer: Self-pay | Admitting: Obstetrics

## 2017-02-06 VITALS — BP 116/79 | HR 90 | Wt 226.2 lb

## 2017-02-06 DIAGNOSIS — K219 Gastro-esophageal reflux disease without esophagitis: Secondary | ICD-10-CM

## 2017-02-06 DIAGNOSIS — I10 Essential (primary) hypertension: Secondary | ICD-10-CM

## 2017-02-06 DIAGNOSIS — O0990 Supervision of high risk pregnancy, unspecified, unspecified trimester: Secondary | ICD-10-CM

## 2017-02-06 DIAGNOSIS — O24913 Unspecified diabetes mellitus in pregnancy, third trimester: Secondary | ICD-10-CM

## 2017-02-06 DIAGNOSIS — O09529 Supervision of elderly multigravida, unspecified trimester: Secondary | ICD-10-CM

## 2017-02-06 DIAGNOSIS — O24919 Unspecified diabetes mellitus in pregnancy, unspecified trimester: Secondary | ICD-10-CM

## 2017-02-06 DIAGNOSIS — O0993 Supervision of high risk pregnancy, unspecified, third trimester: Secondary | ICD-10-CM

## 2017-02-06 DIAGNOSIS — O09523 Supervision of elderly multigravida, third trimester: Secondary | ICD-10-CM

## 2017-02-06 DIAGNOSIS — O26893 Other specified pregnancy related conditions, third trimester: Secondary | ICD-10-CM

## 2017-02-06 DIAGNOSIS — R12 Heartburn: Secondary | ICD-10-CM

## 2017-02-06 DIAGNOSIS — E6609 Other obesity due to excess calories: Secondary | ICD-10-CM

## 2017-02-06 MED ORDER — OMEPRAZOLE 20 MG PO CPDR: 20.0000 mg | DELAYED_RELEASE_CAPSULE | Freq: Two times a day (BID) | ORAL | 5 refills | Status: DC

## 2017-02-06 NOTE — Progress Notes (Addendum)
Subjective:  Michelle Houston is a 36 y.o. [phone] at [redacted]w[redacted]d being seen today for ongoing prenatal care.  She is currently monitored for the following issues for this high-risk pregnancy and has Supervision of high risk pregnancy, antepartum; Chronic hypertension; DM (diabetes mellitus) (HCC); S/P cholecystectomy; History of bladder repair surgery; H/O carpal tunnel repair; Obese; Low vitamin D level; Abnormal Pap smear of cervix; AMA (advanced maternal age) multigravida 35+; Diabetes mellitus affecting pregnancy, antepartum; and Chronic hypertension during pregnancy, antepartum on her problem list.  Patient reports heartburn.  Contractions: Not present. Vag. Bleeding: None.  Movement: Present. Denies leaking of fluid.   The following portions of the patient's history were reviewed and updated as appropriate: allergies, current medications, past family history, past medical history, past social history, past surgical history and problem list. Problem list updated.  Objective:   Vitals:   02/06/17 1059  BP: 116/79  Pulse: 90  Weight: 226 lb 3.2 oz (102.6 kg)    Fetal Status: Fetal Heart Rate (bpm): 150   Movement: Present     General:  Alert, oriented and cooperative. Patient is in no acute distress.  Skin: Skin is warm and dry. No rash noted.   Cardiovascular: Normal heart rate noted  Respiratory: Normal respiratory effort, no problems with respiration noted  Abdomen: Soft, gravid, appropriate for gestational age. Pain/Pressure: Absent     Pelvic:  Cervical exam deferred        Extremities: Normal range of motion.  Edema: None  Mental Status: Normal mood and affect. Normal behavior. Normal judgment and thought content.   Urinalysis:      Assessment and Plan:  Pregnancy: [id number] at [redacted]w[redacted]d  1. Supervision of high risk pregnancy, antepartum   2. Supervision of elderly multigravida, antepartum   3. Diabetes mellitus affecting pregnancy, antepartum - good glucose control  4.  Heartburn during pregnancy in third trimester Rx: - omeprazole (PRILOSEC) 20 MG capsule; Take 1 capsule (20 mg total) by mouth 2 (two) times daily before a meal.  Dispense: 60 capsule; Refill: 5  5. GERD without esophagitis Rx: - omeprazole (PRILOSEC) 20 MG capsule; Take 1 capsule (20 mg total) by mouth 2 (two) times daily before a meal.  Dispense: 60 capsule; Refill: 5  6. Obesity due to excess calories with serious comorbidity, unspecified classification   7. Chronic hypertension - stable BP's   Preterm labor symptoms and general obstetric precautions including but not limited to vaginal bleeding, contractions, leaking of fluid and fetal movement were reviewed in detail with the patient. Please refer to After Visit Summary for other counseling recommendations.  Return in about 2 weeks (around 02/20/2017) for ROB.   Brock Bad, MD

## 2017-02-06 NOTE — Progress Notes (Signed)
Patient reports good fetal movement, denies pain. Pt states that her sugar levels have been good, but she forgot to bring them today.

## 2017-02-12 ENCOUNTER — Ambulatory Visit (INDEPENDENT_AMBULATORY_CARE_PROVIDER_SITE_OTHER): Payer: Medicaid Other | Admitting: Obstetrics and Gynecology

## 2017-02-12 VITALS — BP 113/75 | HR 96 | Wt 227.0 lb

## 2017-02-12 DIAGNOSIS — O10919 Unspecified pre-existing hypertension complicating pregnancy, unspecified trimester: Secondary | ICD-10-CM

## 2017-02-12 DIAGNOSIS — O0990 Supervision of high risk pregnancy, unspecified, unspecified trimester: Secondary | ICD-10-CM

## 2017-02-12 DIAGNOSIS — O09523 Supervision of elderly multigravida, third trimester: Secondary | ICD-10-CM

## 2017-02-12 DIAGNOSIS — O24919 Unspecified diabetes mellitus in pregnancy, unspecified trimester: Secondary | ICD-10-CM

## 2017-02-12 NOTE — Progress Notes (Signed)
Subjective:  Michelle Houston is a 36 y.o. [phone] at [redacted]w[redacted]d being seen today for ongoing prenatal care.  She is currently monitored for the following issues for this high-risk pregnancy and has Supervision of high risk pregnancy, antepartum; Chronic hypertension; DM (diabetes mellitus) (HCC); S/P cholecystectomy; History of bladder repair surgery; H/O carpal tunnel repair; Obese; Low vitamin D level; Abnormal Pap smear of cervix; AMA (advanced maternal age) multigravida 35+; Diabetes mellitus affecting pregnancy, antepartum; and Chronic hypertension during pregnancy, antepartum on her problem list.  Patient reports occasional contractions.  Contractions: Irregular. Vag. Bleeding: None.  Movement: Present. Denies leaking of fluid.   The following portions of the patient's history were reviewed and updated as appropriate: allergies, current medications, past family history, past medical history, past social history, past surgical history and problem list. Problem list updated.  Objective:   Vitals:   02/12/17 1002  BP: 113/75  Pulse: 96  Weight: 227 lb (103 kg)    Fetal Status: Fetal Heart Rate (bpm): 152   Movement: Present     General:  Alert, oriented and cooperative. Patient is in no acute distress.  Skin: Skin is warm and dry. No rash noted.   Cardiovascular: Normal heart rate noted  Respiratory: Normal respiratory effort, no problems with respiration noted  Abdomen: Soft, gravid, appropriate for gestational age. Pain/Pressure: Present     Pelvic:  Cervical exam performed        Extremities: Normal range of motion.  Edema: None  Mental Status: Normal mood and affect. Normal behavior. Normal judgment and thought content.   Urinalysis:      Assessment and Plan:  Pregnancy: [id number] at [redacted]w[redacted]d  1. Supervision of high risk pregnancy, antepartum Stable Decline Flu vaccine Tdap next OB visit Considering IUD for contraception - CBC - HIV antibody - RPR  2. Elderly multigravida in  third trimester Panorama normal  3. Diabetes mellitus affecting pregnancy, antepartum BS in goal range Continue with Glyburide bid ECHO/Opth appts next week F/U U/S scheduled Start twice weekly  testing next visit  4. Chronic hypertension during pregnancy, antepartum Stable Continue with Labetalol  Preterm labor symptoms and general obstetric precautions including but not limited to vaginal bleeding, contractions, leaking of fluid and fetal movement were reviewed in detail with the patient. Please refer to After Visit Summary for other counseling recommendations.  Return in about 2 weeks (around 02/26/2017).   Hermina Staggers, MD

## 2017-02-13 LAB — CBC
Hematocrit: 35.3 % (ref 34.0–46.6)
Hemoglobin: 12.2 g/dL (ref 11.1–15.9)
MCH: 29.8 pg (ref 26.6–33.0)
MCHC: 34.6 g/dL (ref 31.5–35.7)
MCV: 86 fL (ref 79–97)
Platelets: 273 10*3/uL (ref 150–379)
RBC: 4.1 x10E6/uL (ref 3.77–5.28)
RDW: 14 % (ref 12.3–15.4)
WBC: 5.9 10*3/uL (ref 3.4–10.8)

## 2017-02-13 LAB — HIV ANTIBODY (ROUTINE TESTING W REFLEX): HIV Screen 4th Generation wRfx: NONREACTIVE

## 2017-02-13 LAB — RPR: RPR Ser Ql: NONREACTIVE

## 2017-02-15 ENCOUNTER — Other Ambulatory Visit: Payer: Medicaid Other

## 2017-02-20 ENCOUNTER — Encounter: Payer: Medicaid Other | Admitting: Obstetrics and Gynecology

## 2017-02-26 ENCOUNTER — Ambulatory Visit (INDEPENDENT_AMBULATORY_CARE_PROVIDER_SITE_OTHER): Payer: Medicaid Other | Admitting: Obstetrics and Gynecology

## 2017-02-26 VITALS — BP 95/64 | HR 97 | Wt 225.2 lb

## 2017-02-26 DIAGNOSIS — O0993 Supervision of high risk pregnancy, unspecified, third trimester: Secondary | ICD-10-CM

## 2017-02-26 DIAGNOSIS — Z23 Encounter for immunization: Secondary | ICD-10-CM | POA: Diagnosis not present

## 2017-02-26 DIAGNOSIS — O24913 Unspecified diabetes mellitus in pregnancy, third trimester: Secondary | ICD-10-CM

## 2017-02-26 DIAGNOSIS — O10919 Unspecified pre-existing hypertension complicating pregnancy, unspecified trimester: Secondary | ICD-10-CM

## 2017-02-26 DIAGNOSIS — O24919 Unspecified diabetes mellitus in pregnancy, unspecified trimester: Secondary | ICD-10-CM

## 2017-02-26 DIAGNOSIS — O0990 Supervision of high risk pregnancy, unspecified, unspecified trimester: Secondary | ICD-10-CM

## 2017-02-26 DIAGNOSIS — O10913 Unspecified pre-existing hypertension complicating pregnancy, third trimester: Secondary | ICD-10-CM

## 2017-02-26 NOTE — Progress Notes (Signed)
   PRENATAL VISIT NOTE  Subjective:  Michelle Houston is a 36 y.o. [phone] at [redacted]w[redacted]d being seen today for ongoing prenatal care.  She is currently monitored for the following issues for this high-risk pregnancy and has Supervision of high risk pregnancy, antepartum; Chronic hypertension; DM (diabetes mellitus) (HCC); S/P cholecystectomy; History of bladder repair surgery; H/O carpal tunnel repair; Obese; Low vitamin D level; Abnormal Pap smear of cervix; AMA (advanced maternal age) multigravida 35+; Diabetes mellitus affecting pregnancy, antepartum; and Chronic hypertension during pregnancy, antepartum on her problem list.  Patient reports no complaints.  Contractions: Not present. Vag. Bleeding: None.  Movement: Present. Denies leaking of fluid.   Glyburide 2.5 BID Reports fasting 70-80s PP rarely over 120  The following portions of the patient's history were reviewed and updated as appropriate: allergies, current medications, past family history, past medical history, past social history, past surgical history and problem list. Problem list updated.  Objective:   Vitals:   02/26/17 1319  BP: 95/64  Pulse: 97  Weight: 225 lb 3.2 oz (102.2 kg)    Fetal Status: Fetal Heart Rate (bpm): NST   Movement: Present     General:  Alert, oriented and cooperative. Patient is in no acute distress.  Skin: Skin is warm and dry. No rash noted.   Cardiovascular: Normal heart rate noted  Respiratory: Normal respiratory effort, no problems with respiration noted  Abdomen: Soft, gravid, appropriate for gestational age.  Pain/Pressure: Present     Pelvic: Cervical exam deferred        Extremities: Normal range of motion.  Edema: None  Mental Status:  Normal mood and affect. Normal behavior. Normal judgment and thought content.   Assessment and Plan:  Pregnancy: [id number] at [redacted]w[redacted]d  1. Supervision of high risk pregnancy, antepartum Tdap today Gave info for [facility] Weekly NST/BPP at [facility]  2.  Diabetes mellitus affecting pregnancy, antepartum Cont Glyburide 2.5 BID - [place] MFM FETAL BPP WO NON STRESS; Future  3. Chronic hypertension during pregnancy, antepartum BP well controlled Cont labetalol 200 mg BID NST reactive today   Preterm labor symptoms and general obstetric precautions including but not limited to vaginal bleeding, contractions, leaking of fluid and fetal movement were reviewed in detail with the patient. Please refer to After Visit Summary for other counseling recommendations.  Return in about 1 week (around 03/05/2017).   Conan Bowens, MD

## 2017-02-26 NOTE — Patient Instructions (Signed)
Third Trimester of Pregnancy The third trimester is from week 28 through week 40 (months 7 through 9). The third trimester is a time when the unborn baby (fetus) is growing rapidly. At the end of the ninth month, the fetus is about 20 inches in length and weighs 6-10 pounds. Body changes during your third trimester Your body will continue to go through many changes during pregnancy. The changes vary from woman to woman. During the third trimester:  Your weight will continue to increase. You can expect to gain 25-35 pounds (11-16 kg) by the end of the pregnancy.  You may begin to get stretch marks on your hips, abdomen, and breasts.  You may urinate more often because the fetus is moving lower into your pelvis and pressing on your bladder.  You may develop or continue to have heartburn. This is caused by increased hormones that slow down muscles in the digestive tract.  You may develop or continue to have constipation because increased hormones slow digestion and cause the muscles that push waste through your intestines to relax.  You may develop hemorrhoids. These are swollen veins (varicose veins) in the rectum that can itch or be painful.  You may develop swollen, bulging veins (varicose veins) in your legs.  You may have increased body aches in the pelvis, back, or thighs. This is due to weight gain and increased hormones that are relaxing your joints.  You may have changes in your hair. These can include thickening of your hair, rapid growth, and changes in texture. Some women also have hair loss during or after pregnancy, or hair that feels dry or thin. Your hair will most likely return to normal after your baby is born.  Your breasts will continue to grow and they will continue to become tender. A yellow fluid (colostrum) may leak from your breasts. This is the first milk you are producing for your baby.  Your belly button may stick out.  You may notice more swelling in your hands,  face, or ankles.  You may have increased tingling or numbness in your hands, arms, and legs. The skin on your belly may also feel numb.  You may feel short of breath because of your expanding uterus.  You may have more problems sleeping. This can be caused by the size of your belly, increased need to urinate, and an increase in your body's metabolism.  You may notice the fetus "dropping," or moving lower in your abdomen (lightening).  You may have increased vaginal discharge.  You may notice your joints feel loose and you may have pain around your pelvic bone.  What to expect at prenatal visits You will have prenatal exams every 2 weeks until week 36. Then you will have weekly prenatal exams. During a routine prenatal visit:  You will be weighed to make sure you and the baby are growing normally.  Your blood pressure will be taken.  Your abdomen will be measured to track your baby's growth.  The fetal heartbeat will be listened to.  Any test results from the previous visit will be discussed.  You may have a cervical check near your due date to see if your cervix has softened or thinned (effaced).  You will be tested for Group B streptococcus. This happens between 35 and 37 weeks.  Your health care provider may ask you:  What your birth plan is.  How you are feeling.  If you are feeling the baby move.  If you have had   any abnormal symptoms, such as leaking fluid, bleeding, severe headaches, or abdominal cramping.  If you are using any tobacco products, including cigarettes, chewing tobacco, and electronic cigarettes.  If you have any questions.  Other tests or screenings that may be performed during your third trimester include:  Blood tests that check for low iron levels (anemia).  Fetal testing to check the health, activity level, and growth of the fetus. Testing is done if you have certain medical conditions or if there are problems during the  pregnancy.  Nonstress test (NST). This test checks the health of your baby to make sure there are no signs of problems, such as the baby not getting enough oxygen. During this test, a belt is placed around your belly. The baby is made to move, and its heart rate is monitored during movement.  What is false labor? False labor is a condition in which you feel small, irregular tightenings of the muscles in the womb (contractions) that usually go away with rest, changing position, or drinking water. These are called Braxton Hicks contractions. Contractions may last for hours, days, or even weeks before true labor sets in. If contractions come at regular intervals, become more frequent, increase in intensity, or become painful, you should see your health care provider. What are the signs of labor?  Abdominal cramps.  Regular contractions that start at 10 minutes apart and become stronger and more frequent with time.  Contractions that start on the top of the uterus and spread down to the lower abdomen and back.  Increased pelvic pressure and dull back pain.  A watery or bloody mucus discharge that comes from the vagina.  Leaking of amniotic fluid. This is also known as your "water breaking." It could be a slow trickle or a gush. Let your health care provider know if it has a color or strange odor. If you have any of these signs, call your health care provider right away, even if it is before your due date. Follow these instructions at home: Medicines  Follow your health care provider's instructions regarding medicine use. Specific medicines may be either safe or unsafe to take during pregnancy.  Take a prenatal vitamin that contains at least 600 micrograms (mcg) of folic acid.  If you develop constipation, try taking a stool softener if your health care provider approves. Eating and drinking  Eat a balanced diet that includes fresh fruits and vegetables, whole grains, good sources of protein  such as meat, eggs, or tofu, and low-fat dairy. Your health care provider will help you determine the amount of weight gain that is right for you.  Avoid raw meat and uncooked cheese. These carry germs that can cause birth defects in the baby.  If you have low calcium intake from food, talk to your health care provider about whether you should take a daily calcium supplement.  Eat four or five small meals rather than three large meals a day.  Limit foods that are high in fat and processed sugars, such as fried and sweet foods.  To prevent constipation: ? Drink enough fluid to keep your urine clear or pale yellow. ? Eat foods that are high in fiber, such as fresh fruits and vegetables, whole grains, and beans. Activity  Exercise only as directed by your health care provider. Most women can continue their usual exercise routine during pregnancy. Try to exercise for 30 minutes at least 5 days a week. Stop exercising if you experience uterine contractions.  Avoid heavy   lifting.  Do not exercise in extreme heat or humidity, or at high altitudes.  Wear low-heel, comfortable shoes.  Practice good posture.  You may continue to have sex unless your health care provider tells you otherwise. Relieving pain and discomfort  Take frequent breaks and rest with your legs elevated if you have leg cramps or low back pain.  Take warm sitz baths to soothe any pain or discomfort caused by hemorrhoids. Use hemorrhoid cream if your health care provider approves.  Wear a good support bra to prevent discomfort from breast tenderness.  If you develop varicose veins: ? Wear support pantyhose or compression stockings as told by your healthcare provider. ? Elevate your feet for 15 minutes, 3-4 times a day. Prenatal care  Write down your questions. Take them to your prenatal visits.  Keep all your prenatal visits as told by your health care provider. This is important. Safety  Wear your seat belt at  all times when driving.  Make a list of emergency phone numbers, including numbers for family, friends, the hospital, and police and fire departments. General instructions  Avoid cat litter boxes and soil used by cats. These carry germs that can cause birth defects in the baby. If you have a cat, ask someone to clean the litter box for you.  Do not travel far distances unless it is absolutely necessary and only with the approval of your health care provider.  Do not use hot tubs, steam rooms, or saunas.  Do not drink alcohol.  Do not use any products that contain nicotine or tobacco, such as cigarettes and e-cigarettes. If you need help quitting, ask your health care provider.  Do not use any medicinal herbs or unprescribed drugs. These chemicals affect the formation and growth of the baby.  Do not douche or use tampons or scented sanitary pads.  Do not cross your legs for long periods of time.  To prepare for the arrival of your baby: ? Take prenatal classes to understand, practice, and ask questions about labor and delivery. ? Make a trial run to the hospital. ? Visit the hospital and tour the maternity area. ? Arrange for maternity or paternity leave through employers. ? Arrange for family and friends to take care of pets while you are in the hospital. ? Purchase a rear-facing car seat and make sure you know how to install it in your car. ? Pack your hospital bag. ? Prepare the baby's nursery. Make sure to remove all pillows and stuffed animals from the baby's crib to prevent suffocation.  Visit your dentist if you have not gone during your pregnancy. Use a soft toothbrush to brush your teeth and be gentle when you floss. Contact a health care provider if:  You are unsure if you are in labor or if your water has broken.  You become dizzy.  You have mild pelvic cramps, pelvic pressure, or nagging pain in your abdominal area.  You have lower back pain.  You have persistent  nausea, vomiting, or diarrhea.  You have an unusual or bad smelling vaginal discharge.  You have pain when you urinate. Get help right away if:  Your water breaks before 37 weeks.  You have regular contractions less than 5 minutes apart before 37 weeks.  You have a fever.  You are leaking fluid from your vagina.  You have spotting or bleeding from your vagina.  You have severe abdominal pain or cramping.  You have rapid weight loss or weight gain.    You have shortness of breath with chest pain.  You notice sudden or extreme swelling of your face, hands, ankles, feet, or legs.  Your baby makes fewer than 10 movements in 2 hours.  You have severe headaches that do not go away when you take medicine.  You have vision changes. Summary  The third trimester is from week 28 through week 40, months 7 through 9. The third trimester is a time when the unborn baby (fetus) is growing rapidly.  During the third trimester, your discomfort may increase as you and your baby continue to gain weight. You may have abdominal, leg, and back pain, sleeping problems, and an increased need to urinate.  During the third trimester your breasts will keep growing and they will continue to become tender. A yellow fluid (colostrum) may leak from your breasts. This is the first milk you are producing for your baby.  False labor is a condition in which you feel small, irregular tightenings of the muscles in the womb (contractions) that eventually go away. These are called Braxton Hicks contractions. Contractions may last for hours, days, or even weeks before true labor sets in.  Signs of labor can include: abdominal cramps; regular contractions that start at 10 minutes apart and become stronger and more frequent with time; watery or bloody mucus discharge that comes from the vagina; increased pelvic pressure and dull back pain; and leaking of amniotic fluid. This information is not intended to replace advice  given to you by your health care provider. Make sure you discuss any questions you have with your health care provider. Document Released: 05/01/2001 Document Revised: 10/13/2015 Document Reviewed: 07/08/2012 Elsevier Interactive Patient Education  2017 [employer]  

## 2017-02-26 NOTE — Addendum Note (Signed)
Addended by: Natale Milch D on: 02/26/2017 02:26 PM   Modules accepted: Orders

## 2017-02-26 NOTE — Progress Notes (Signed)
Patient reports good fetal movement with pressure. 

## 2017-02-27 ENCOUNTER — Telehealth: Payer: Self-pay | Admitting: *Deleted

## 2017-02-27 ENCOUNTER — Other Ambulatory Visit (HOSPITAL_COMMUNITY): Payer: Self-pay | Admitting: Maternal and Fetal Medicine

## 2017-02-27 ENCOUNTER — Other Ambulatory Visit: Payer: Self-pay | Admitting: Obstetrics and Gynecology

## 2017-02-27 ENCOUNTER — Ambulatory Visit (HOSPITAL_COMMUNITY)
Admission: RE | Admit: 2017-02-27 | Discharge: 2017-02-27 | Disposition: A | Discharge status: Home / Self Care | Payer: Medicaid Other | Source: Ambulatory Visit | Attending: Obstetrics | Admitting: Obstetrics

## 2017-02-27 ENCOUNTER — Encounter (HOSPITAL_COMMUNITY): Payer: Self-pay

## 2017-02-27 DIAGNOSIS — O99213 Obesity complicating pregnancy, third trimester: Secondary | ICD-10-CM | POA: Diagnosis not present

## 2017-02-27 DIAGNOSIS — O163 Unspecified maternal hypertension, third trimester: Secondary | ICD-10-CM

## 2017-02-27 DIAGNOSIS — Z3A32 32 weeks gestation of pregnancy: Secondary | ICD-10-CM

## 2017-02-27 DIAGNOSIS — O24919 Unspecified diabetes mellitus in pregnancy, unspecified trimester: Secondary | ICD-10-CM

## 2017-02-27 DIAGNOSIS — O10013 Pre-existing essential hypertension complicating pregnancy, third trimester: Secondary | ICD-10-CM | POA: Diagnosis not present

## 2017-02-27 DIAGNOSIS — O09523 Supervision of elderly multigravida, third trimester: Secondary | ICD-10-CM

## 2017-02-27 DIAGNOSIS — O24113 Pre-existing diabetes mellitus, type 2, in pregnancy, third trimester: Secondary | ICD-10-CM | POA: Diagnosis not present

## 2017-02-27 DIAGNOSIS — O24913 Unspecified diabetes mellitus in pregnancy, third trimester: Secondary | ICD-10-CM | POA: Diagnosis present

## 2017-02-27 DIAGNOSIS — O24119 Pre-existing diabetes mellitus, type 2, in pregnancy, unspecified trimester: Secondary | ICD-10-CM

## 2017-02-27 DIAGNOSIS — Z7984 Long term (current) use of oral hypoglycemic drugs: Secondary | ICD-10-CM

## 2017-02-27 NOTE — Telephone Encounter (Signed)
Called pt regarding appt needed for fetal testing (NST/BPP) next week.  I offered appt on 10/16 @ 0740 and she accepted. I confirmed that she has had all the necessary testing for this week and her appt which was originally scheduled here on 10/12 @ 0800 has been cancelled. Pt voiced understanding of all information and instructions given.

## 2017-03-01 ENCOUNTER — Other Ambulatory Visit: Payer: Medicaid Other

## 2017-03-04 ENCOUNTER — Telehealth: Payer: Self-pay

## 2017-03-04 NOTE — Telephone Encounter (Signed)
Patient called with complaints of reaction to the TDAP Vaccine; soreness, red , swollen and hot. Advised pt to go to the [facility], she informed me that she has an appt tomorrow 03/05/17 and she prefers to wait until she comes in. I told her if her syptoms got worse to go straight to the hospital.

## 2017-03-05 ENCOUNTER — Ambulatory Visit: Payer: Self-pay

## 2017-03-05 ENCOUNTER — Ambulatory Visit (INDEPENDENT_AMBULATORY_CARE_PROVIDER_SITE_OTHER): Payer: Medicaid Other | Admitting: *Deleted

## 2017-03-05 ENCOUNTER — Ambulatory Visit (INDEPENDENT_AMBULATORY_CARE_PROVIDER_SITE_OTHER): Payer: Medicaid Other | Admitting: Obstetrics & Gynecology

## 2017-03-05 VITALS — BP 102/65 | HR 82 | Wt 223.0 lb

## 2017-03-05 VITALS — BP 103/57 | HR 94

## 2017-03-05 DIAGNOSIS — O0990 Supervision of high risk pregnancy, unspecified, unspecified trimester: Secondary | ICD-10-CM

## 2017-03-05 DIAGNOSIS — O10919 Unspecified pre-existing hypertension complicating pregnancy, unspecified trimester: Secondary | ICD-10-CM

## 2017-03-05 DIAGNOSIS — O24919 Unspecified diabetes mellitus in pregnancy, unspecified trimester: Secondary | ICD-10-CM

## 2017-03-05 DIAGNOSIS — O10913 Unspecified pre-existing hypertension complicating pregnancy, third trimester: Secondary | ICD-10-CM | POA: Diagnosis present

## 2017-03-05 DIAGNOSIS — O24913 Unspecified diabetes mellitus in pregnancy, third trimester: Secondary | ICD-10-CM

## 2017-03-05 DIAGNOSIS — O0993 Supervision of high risk pregnancy, unspecified, third trimester: Secondary | ICD-10-CM

## 2017-03-05 NOTE — Progress Notes (Signed)

## 2017-03-05 NOTE — Progress Notes (Signed)
BPP 8/8 completed this morning.

## 2017-03-05 NOTE — Patient Instructions (Signed)
Third Trimester of Pregnancy The third trimester is from week 28 through week 40 (months 7 through 9). The third trimester is a time when the unborn baby (fetus) is growing rapidly. At the end of the ninth month, the fetus is about 20 inches in length and weighs 6-10 pounds. Body changes during your third trimester Your body will continue to go through many changes during pregnancy. The changes vary from woman to woman. During the third trimester:  Your weight will continue to increase. You can expect to gain 25-35 pounds (11-16 kg) by the end of the pregnancy.  You may begin to get stretch marks on your hips, abdomen, and breasts.  You may urinate more often because the fetus is moving lower into your pelvis and pressing on your bladder.  You may develop or continue to have heartburn. This is caused by increased hormones that slow down muscles in the digestive tract.  You may develop or continue to have constipation because increased hormones slow digestion and cause the muscles that push waste through your intestines to relax.  You may develop hemorrhoids. These are swollen veins (varicose veins) in the rectum that can itch or be painful.  You may develop swollen, bulging veins (varicose veins) in your legs.  You may have increased body aches in the pelvis, back, or thighs. This is due to weight gain and increased hormones that are relaxing your joints.  You may have changes in your hair. These can include thickening of your hair, rapid growth, and changes in texture. Some women also have hair loss during or after pregnancy, or hair that feels dry or thin. Your hair will most likely return to normal after your baby is born.  Your breasts will continue to grow and they will continue to become tender. A yellow fluid (colostrum) may leak from your breasts. This is the first milk you are producing for your baby.  Your belly button may stick out.  You may notice more swelling in your hands,  face, or ankles.  You may have increased tingling or numbness in your hands, arms, and legs. The skin on your belly may also feel numb.  You may feel short of breath because of your expanding uterus.  You may have more problems sleeping. This can be caused by the size of your belly, increased need to urinate, and an increase in your body's metabolism.  You may notice the fetus "dropping," or moving lower in your abdomen (lightening).  You may have increased vaginal discharge.  You may notice your joints feel loose and you may have pain around your pelvic bone.  What to expect at prenatal visits You will have prenatal exams every 2 weeks until week 36. Then you will have weekly prenatal exams. During a routine prenatal visit:  You will be weighed to make sure you and the baby are growing normally.  Your blood pressure will be taken.  Your abdomen will be measured to track your baby's growth.  The fetal heartbeat will be listened to.  Any test results from the previous visit will be discussed.  You may have a cervical check near your due date to see if your cervix has softened or thinned (effaced).  You will be tested for Group B streptococcus. This happens between 35 and 37 weeks.  Your health care provider may ask you:  What your birth plan is.  How you are feeling.  If you are feeling the baby move.  If you have had   any abnormal symptoms, such as leaking fluid, bleeding, severe headaches, or abdominal cramping.  If you are using any tobacco products, including cigarettes, chewing tobacco, and electronic cigarettes.  If you have any questions.  Other tests or screenings that may be performed during your third trimester include:  Blood tests that check for low iron levels (anemia).  Fetal testing to check the health, activity level, and growth of the fetus. Testing is done if you have certain medical conditions or if there are problems during the  pregnancy.  Nonstress test (NST). This test checks the health of your baby to make sure there are no signs of problems, such as the baby not getting enough oxygen. During this test, a belt is placed around your belly. The baby is made to move, and its heart rate is monitored during movement.  What is false labor? False labor is a condition in which you feel small, irregular tightenings of the muscles in the womb (contractions) that usually go away with rest, changing position, or drinking water. These are called Braxton Hicks contractions. Contractions may last for hours, days, or even weeks before true labor sets in. If contractions come at regular intervals, become more frequent, increase in intensity, or become painful, you should see your health care provider. What are the signs of labor?  Abdominal cramps.  Regular contractions that start at 10 minutes apart and become stronger and more frequent with time.  Contractions that start on the top of the uterus and spread down to the lower abdomen and back.  Increased pelvic pressure and dull back pain.  A watery or bloody mucus discharge that comes from the vagina.  Leaking of amniotic fluid. This is also known as your "water breaking." It could be a slow trickle or a gush. Let your health care provider know if it has a color or strange odor. If you have any of these signs, call your health care provider right away, even if it is before your due date. Follow these instructions at home: Medicines  Follow your health care provider's instructions regarding medicine use. Specific medicines may be either safe or unsafe to take during pregnancy.  Take a prenatal vitamin that contains at least 600 micrograms (mcg) of folic acid.  If you develop constipation, try taking a stool softener if your health care provider approves. Eating and drinking  Eat a balanced diet that includes fresh fruits and vegetables, whole grains, good sources of protein  such as meat, eggs, or tofu, and low-fat dairy. Your health care provider will help you determine the amount of weight gain that is right for you.  Avoid raw meat and uncooked cheese. These carry germs that can cause birth defects in the baby.  If you have low calcium intake from food, talk to your health care provider about whether you should take a daily calcium supplement.  Eat four or five small meals rather than three large meals a day.  Limit foods that are high in fat and processed sugars, such as fried and sweet foods.  To prevent constipation: ? Drink enough fluid to keep your urine clear or pale yellow. ? Eat foods that are high in fiber, such as fresh fruits and vegetables, whole grains, and beans. Activity  Exercise only as directed by your health care provider. Most women can continue their usual exercise routine during pregnancy. Try to exercise for 30 minutes at least 5 days a week. Stop exercising if you experience uterine contractions.  Avoid heavy   lifting.  Do not exercise in extreme heat or humidity, or at high altitudes.  Wear low-heel, comfortable shoes.  Practice good posture.  You may continue to have sex unless your health care provider tells you otherwise. Relieving pain and discomfort  Take frequent breaks and rest with your legs elevated if you have leg cramps or low back pain.  Take warm sitz baths to soothe any pain or discomfort caused by hemorrhoids. Use hemorrhoid cream if your health care provider approves.  Wear a good support bra to prevent discomfort from breast tenderness.  If you develop varicose veins: ? Wear support pantyhose or compression stockings as told by your healthcare provider. ? Elevate your feet for 15 minutes, 3-4 times a day. Prenatal care  Write down your questions. Take them to your prenatal visits.  Keep all your prenatal visits as told by your health care provider. This is important. Safety  Wear your seat belt at  all times when driving.  Make a list of emergency phone numbers, including numbers for family, friends, the hospital, and police and fire departments. General instructions  Avoid cat litter boxes and soil used by cats. These carry germs that can cause birth defects in the baby. If you have a cat, ask someone to clean the litter box for you.  Do not travel far distances unless it is absolutely necessary and only with the approval of your health care provider.  Do not use hot tubs, steam rooms, or saunas.  Do not drink alcohol.  Do not use any products that contain nicotine or tobacco, such as cigarettes and e-cigarettes. If you need help quitting, ask your health care provider.  Do not use any medicinal herbs or unprescribed drugs. These chemicals affect the formation and growth of the baby.  Do not douche or use tampons or scented sanitary pads.  Do not cross your legs for long periods of time.  To prepare for the arrival of your baby: ? Take prenatal classes to understand, practice, and ask questions about labor and delivery. ? Make a trial run to the hospital. ? Visit the hospital and tour the maternity area. ? Arrange for maternity or paternity leave through employers. ? Arrange for family and friends to take care of pets while you are in the hospital. ? Purchase a rear-facing car seat and make sure you know how to install it in your car. ? Pack your hospital bag. ? Prepare the baby's nursery. Make sure to remove all pillows and stuffed animals from the baby's crib to prevent suffocation.  Visit your dentist if you have not gone during your pregnancy. Use a soft toothbrush to brush your teeth and be gentle when you floss. Contact a health care provider if:  You are unsure if you are in labor or if your water has broken.  You become dizzy.  You have mild pelvic cramps, pelvic pressure, or nagging pain in your abdominal area.  You have lower back pain.  You have persistent  nausea, vomiting, or diarrhea.  You have an unusual or bad smelling vaginal discharge.  You have pain when you urinate. Get help right away if:  Your water breaks before 37 weeks.  You have regular contractions less than 5 minutes apart before 37 weeks.  You have a fever.  You are leaking fluid from your vagina.  You have spotting or bleeding from your vagina.  You have severe abdominal pain or cramping.  You have rapid weight loss or weight gain.    You have shortness of breath with chest pain.  You notice sudden or extreme swelling of your face, hands, ankles, feet, or legs.  Your baby makes fewer than 10 movements in 2 hours.  You have severe headaches that do not go away when you take medicine.  You have vision changes. Summary  The third trimester is from week 28 through week 40, months 7 through 9. The third trimester is a time when the unborn baby (fetus) is growing rapidly.  During the third trimester, your discomfort may increase as you and your baby continue to gain weight. You may have abdominal, leg, and back pain, sleeping problems, and an increased need to urinate.  During the third trimester your breasts will keep growing and they will continue to become tender. A yellow fluid (colostrum) may leak from your breasts. This is the first milk you are producing for your baby.  False labor is a condition in which you feel small, irregular tightenings of the muscles in the womb (contractions) that eventually go away. These are called Braxton Hicks contractions. Contractions may last for hours, days, or even weeks before true labor sets in.  Signs of labor can include: abdominal cramps; regular contractions that start at 10 minutes apart and become stronger and more frequent with time; watery or bloody mucus discharge that comes from the vagina; increased pelvic pressure and dull back pain; and leaking of amniotic fluid. This information is not intended to replace advice  given to you by your health care provider. Make sure you discuss any questions you have with your health care provider. Document Released: 05/01/2001 Document Revised: 10/13/2015 Document Reviewed: 07/08/2012 Elsevier Interactive Patient Education  2017 [employer]  
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
## 2017-03-12 ENCOUNTER — Encounter (HOSPITAL_COMMUNITY): Payer: Self-pay | Admitting: *Deleted

## 2017-03-12 ENCOUNTER — Inpatient Hospital Stay (HOSPITAL_COMMUNITY)
Admission: AD | Admit: 2017-03-12 | Discharge: 2017-03-12 | Disposition: A | Discharge status: Home / Self Care | Payer: Medicaid Other | Source: Ambulatory Visit | Attending: Obstetrics & Gynecology | Admitting: Obstetrics & Gynecology

## 2017-03-12 ENCOUNTER — Encounter: Payer: Medicaid Other | Admitting: Obstetrics and Gynecology

## 2017-03-12 ENCOUNTER — Inpatient Hospital Stay (EMERGENCY_DEPARTMENT_HOSPITAL)
Admission: AD | Admit: 2017-03-12 | Discharge: 2017-03-13 | Disposition: A | Discharge status: Home / Self Care | Payer: Medicaid Other | Source: Ambulatory Visit | Attending: Family Medicine | Admitting: Family Medicine

## 2017-03-12 ENCOUNTER — Other Ambulatory Visit: Payer: Medicaid Other

## 2017-03-12 DIAGNOSIS — O212 Late vomiting of pregnancy: Secondary | ICD-10-CM

## 2017-03-12 DIAGNOSIS — R197 Diarrhea, unspecified: Secondary | ICD-10-CM

## 2017-03-12 DIAGNOSIS — F17210 Nicotine dependence, cigarettes, uncomplicated: Secondary | ICD-10-CM | POA: Diagnosis not present

## 2017-03-12 DIAGNOSIS — O26893 Other specified pregnancy related conditions, third trimester: Secondary | ICD-10-CM | POA: Insufficient documentation

## 2017-03-12 DIAGNOSIS — Z7982 Long term (current) use of aspirin: Secondary | ICD-10-CM | POA: Diagnosis not present

## 2017-03-12 DIAGNOSIS — IMO0001 Reserved for inherently not codable concepts without codable children: Secondary | ICD-10-CM

## 2017-03-12 DIAGNOSIS — A084 Viral intestinal infection, unspecified: Secondary | ICD-10-CM | POA: Insufficient documentation

## 2017-03-12 DIAGNOSIS — Z3A33 33 weeks gestation of pregnancy: Secondary | ICD-10-CM

## 2017-03-12 DIAGNOSIS — T6291XA Toxic effect of unspecified noxious substance eaten as food, accidental (unintentional), initial encounter: Secondary | ICD-10-CM | POA: Diagnosis not present

## 2017-03-12 DIAGNOSIS — O99613 Diseases of the digestive system complicating pregnancy, third trimester: Secondary | ICD-10-CM | POA: Insufficient documentation

## 2017-03-12 DIAGNOSIS — O99333 Smoking (tobacco) complicating pregnancy, third trimester: Secondary | ICD-10-CM | POA: Diagnosis not present

## 2017-03-12 DIAGNOSIS — R112 Nausea with vomiting, unspecified: Secondary | ICD-10-CM

## 2017-03-12 DIAGNOSIS — O9989 Other specified diseases and conditions complicating pregnancy, childbirth and the puerperium: Secondary | ICD-10-CM

## 2017-03-12 LAB — URINALYSIS, ROUTINE W REFLEX MICROSCOPIC
Bilirubin Urine: NEGATIVE
Glucose, UA: NEGATIVE mg/dL
Hgb urine dipstick: NEGATIVE
Ketones, ur: 5 mg/dL — AB
Leukocytes, UA: NEGATIVE
Nitrite: NEGATIVE
PH: 5 (ref 5.0–8.0)
Protein, ur: 100 mg/dL — AB
SPECIFIC GRAVITY, URINE: 1.02 (ref 1.005–1.030)

## 2017-03-12 MED ORDER — ONDANSETRON 4 MG PO TBDP
4.0000 mg | ORAL_TABLET | Freq: Once | ORAL | Status: AC
  Administered 2017-03-13: 4 mg via ORAL
  Filled 2017-03-12: qty 1

## 2017-03-12 MED ORDER — LACTATED RINGERS IV BOLUS (SEPSIS)
500.0000 mL | Freq: Once | INTRAVENOUS | Status: AC
  Administered 2017-03-12: 500 mL via INTRAVENOUS

## 2017-03-12 NOTE — Discharge Instructions (Signed)

## 2017-03-12 NOTE — MAU Provider Note (Signed)
Chief Complaint: Diarrhea and Emesis   SUBJECTIVE HPI: Michelle Houston is a 36 y.o. [id number] at [redacted]w[redacted]d who presents to MAU for vomiting and diarrhea. She was seen in MAU earlier this morning and diagnosed with food poisoning Patient states she returned because symptoms have continued. She was not discharged with any medications. Symptoms not worsening. Continued poor PO intake. Symptoms started yesterday. Denies sick contacts or fevers. No blood ins tool or emesis. No dysuria or abdominal pain.   Denies vaginal bleeding, LOF, contractions. Good fetal movement.    Past Medical History:  Diagnosis Date  . Bronchitis   . Diabetes mellitus   . Hypertension   . PCOS (polycystic ovarian syndrome)    OB History  Gravida Para Term Preterm AB Living  5 3 3   1 3   SAB TAB Ectopic Multiple Live Births  1     0 3    # Outcome Date GA Lbr Len/2nd Weight Sex Delivery Anes PTL Lv  5 Current           4 Term 05/12/14 [redacted]w[redacted]d 07:53 / 00:06 3.35 kg (7 lb 6.2 oz) F Vag-Spont EPI  LIV     Birth Comments: WNL  3 SAB 07/2012        DEC  2 Term 07/1999 [redacted]w[redacted]d  3.26 kg (7 lb 3 oz) F Vag-Spont None  LIV  1 Term 02/1996 [redacted]w[redacted]d  3.147 kg (6 lb 15 oz) F Vag-Spont None  LIV     Past Surgical History:  Procedure Laterality Date  . BLADDER REPAIR    . CARPAL TUNNEL RELEASE    . CHOLECYSTECTOMY     Social History   Social History  . Marital status: Legally Separated    Spouse name: N/A  . Number of children: N/A  . Years of education: N/A   Occupational History  . Not on file.   Social History Main Topics  . Smoking status: Current Every Day Smoker    Packs/day: 0.50    Years: 18.00    Types: Cigarettes  . Smokeless tobacco: Never Used     Comment: 6 day  . Alcohol use No     Comment: rare   . Drug use: No  . Sexual activity: Yes    Partners: Male    Birth control/ protection: None   Other Topics Concern  . Not on file   Social History Narrative  . No narrative on file   No current  facility-administered medications on file prior to encounter.    Current Outpatient Prescriptions on File Prior to Encounter  Medication Sig Dispense Refill  . ACCU-CHEK FASTCLIX LANCETS MISC 1 application by Does not apply route 4 (four) times daily -  with meals and at bedtime. 102 each 12  . acetaminophen (TYLENOL) 500 MG tablet Take 500 mg by mouth every 6 (six) hours as needed.    Marland Kitchen albuterol (PROVENTIL HFA;VENTOLIN HFA) 108 (90 Base) MCG/ACT inhaler Inhale 2 puffs into the lungs every 6 (six) hours as needed for wheezing or shortness of breath.    Marland Kitchen aspirin 81 MG chewable tablet Chew 1 tablet (81 mg total) by mouth daily. 30 tablet 12  . glucose blood (ACCU-CHEK GUIDE) test strip Use as instructed 100 each 12  . glyBURIDE (DIABETA) 2.5 MG tablet Take 1 tablet (2.5 mg total) by mouth at bedtime. 30 tablet 5  . labetalol (NORMODYNE) 200 MG tablet Take 1 tablet (200 mg total) by mouth 2 (two) times daily. 60 tablet  3  . Lancet Devices MISC 1 Device by Does not apply route 4 (four) times daily. 100 each 11  . omeprazole (PRILOSEC) 20 MG capsule Take 1 capsule (20 mg total) by mouth 2 (two) times daily before a meal. 60 capsule 5  . Prenat-FeAsp-Meth-FA-DHA w/o A (PRENATE PIXIE) 10-0.6-0.4-200 MG CAPS Take 1 tablet by mouth daily. 30 capsule 12  . Vitamin D, Ergocalciferol, (DRISDOL) 50000 units CAPS capsule Take 1 capsule (50,000 Units total) by mouth every 7 (seven) days. 30 capsule 2   No Known Allergies  I have reviewed the past Medical Hx, Surgical Hx, Social Hx, Allergies and Medications.   REVIEW OF SYSTEMS All systems reviewed and are negative for acute change except as noted in the HPI.   OBJECTIVE BP 121/74 (BP Location: Right Arm)   Pulse 93   Temp 98 F (36.7 C) (Oral)   Resp 16   Ht 5\' 1"  (1.549 m)   Wt 101.2 kg (223 lb)   LMP 07/20/2016 (Approximate)   BMI 42.14 kg/m    PHYSICAL EXAM Constitutional: Well-developed, well-nourished female in no acute distress.   Throat: o/p clear, MMM Cardiovascular: normal rate and rhythm, pulses intact Respiratory: normal rate and effort.  GI: Abd soft, non-tender, gravid appropriate for gestational age.  MS: Extremities nontender, no edema, normal ROM Neurologic: Alert and oriented x 4.  GU: Neg CVAT. Psych: normal mood and affect  LAB RESULTS Results for orders placed or performed during the hospital encounter of 03/12/17  CBC  Result Value Ref Range   WBC 4.6 4.0 - 10.5 K/uL   RBC 4.36 3.87 - 5.11 MIL/uL   Hemoglobin 12.7 12.0 - 15.0 g/dL   HCT 16.1 09.6 - 04.5 %   MCV 87.8 78.0 - 100.0 fL   MCH 29.1 26.0 - 34.0 pg   MCHC 33.2 30.0 - 36.0 g/dL   RDW 40.9 81.1 - 91.4 %   Platelets 242 150 - 400 K/uL  Comprehensive metabolic panel  Result Value Ref Range   Sodium 132 (L) 135 - 145 mmol/L   Potassium 3.5 3.5 - 5.1 mmol/L   Chloride 103 101 - 111 mmol/L   CO2 20 (L) 22 - 32 mmol/L   Glucose, Bld 149 (H) 65 - 99 mg/dL   BUN 8 6 - 20 mg/dL   Creatinine, Ser 7.82 0.44 - 1.00 mg/dL   Calcium 8.5 (L) 8.9 - 10.3 mg/dL   Total Protein 7.0 6.5 - 8.1 g/dL   Albumin 3.0 (L) 3.5 - 5.0 g/dL   AST 28 15 - 41 U/L   ALT 30 14 - 54 U/L   Alkaline Phosphatase 134 (H) 38 - 126 U/L   Total Bilirubin 0.3 0.3 - 1.2 mg/dL   GFR calc non Af Amer >60 >60 mL/min   GFR calc Af Amer >60 >60 mL/min   Anion gap 9 5 - 15  Urinalysis, Routine w reflex microscopic  Result Value Ref Range   Color, Urine AMBER (A) YELLOW   APPearance HAZY (A) CLEAR   Specific Gravity, Urine 1.026 1.005 - 1.030   pH 5.0 5.0 - 8.0   Glucose, UA NEGATIVE NEGATIVE mg/dL   Hgb urine dipstick NEGATIVE NEGATIVE   Bilirubin Urine NEGATIVE NEGATIVE   Ketones, ur 5 (A) NEGATIVE mg/dL   Protein, ur 956 (A) NEGATIVE mg/dL   Nitrite NEGATIVE NEGATIVE   Leukocytes, UA NEGATIVE NEGATIVE   RBC / HPF 0-5 0 - 5 RBC/hpf   WBC, UA 0-5 0 - 5  WBC/hpf   Bacteria, UA RARE (A) NONE SEEN   Squamous Epithelial / LPF 0-5 (A) NONE SEEN   Mucus PRESENT     Hyaline Casts, UA PRESENT      IMAGING [place] Mfm Fetal Bpp Wo Non Stress  Result Date: 02/27/2017 ----------------------------------------------------------------------  OBSTETRICS REPORT                      (Signed Final 02/27/2017 10:32 am) ---------------------------------------------------------------------- Patient Info  ID #:       [id number]                          D.O.B.:  [birth year] (36 yrs)  Name:       Michelle Houston               Visit Date: 02/27/2017 09:32 am ---------------------------------------------------------------------- Performed By  Performed By:     Lenise Arena        Ref. Address:     [address]                                                             [place]                                                             [address]  Attending:        Particia Nearing MD       Location:         [facility]  Referred By:      Roe Coombs CNM ---------------------------------------------------------------------- Orders   #  Description                                 Code   1  [place] MFM OB FOLLOW UP                         [id number]   2  [place] MFM FETAL BPP WO NON STRESS              76819.01  ----------------------------------------------------------------------   #  Ordered By               Order #        Accession #    Episode #   1  MARTHA DECKER            [id number]  [id number]     [id number]   2  KELLY DAVIS              [id number]      [id number]     [id number]  ---------------------------------------------------------------------- Indications   [redacted] weeks gestation of pregnancy                Z3A.54   Advanced maternal age multigravida 5+,        O91.523   third trimester (low risk NIPS)   Pre-existing diabetes, type 2, in pregnancy,   O24.113   third trimester (glyburide, A1c 6.2 on 09/19/16)   Obesity complicating pregnancy, third          O99.213    trimester (pregravid BMI 41.4)   Hypertension - Chronic/Pre-existing on         O10.019   labetalol and ASA  ---------------------------------------------------------------------- OB History  Blood Type:            Height:  5'1"   Weight (lb):  226       BMI:  42.7  Gravidity:    5         Term:   3        Prem:   0        SAB:   1  TOP:          0       Ectopic:  0        Living: 3 ---------------------------------------------------------------------- Fetal Evaluation  Num Of Fetuses:     1  Fetal Heart         154  Rate(bpm):  Cardiac Activity:   Observed  Presentation:       Cephalic  Placenta:           Anterior, above cervical os  P. Cord Insertion:  Previously Visualized  Amniotic Fluid  AFI FV:      Subjectively within normal limits  AFI Sum(cm)     %Tile       Largest Pocket(cm)  15.06           53          5.9  RUQ(cm)       RLQ(cm)       LUQ(cm)        LLQ(cm)  2.23          2.92          4.01           5.9 ---------------------------------------------------------------------- Biophysical Evaluation  Amniotic F.V:   Within normal limits       F. Tone:        Observed  F. Movement:    Observed                   Score:          8/8  F. Breathing:   Observed ---------------------------------------------------------------------- Biometry  BPD:      75.9  mm     G. Age:  30w 3d          7  %    CI:        74.09   %    70 - 86  FL/HC:      20.8   %    19.1 - 21.3  HC:       280   mm     G. Age:  30w 5d        < 3  %    HC/AC:      0.94        0.96 - 1.17  AC:      297.8  mm     G. Age:  33w 5d         91  %    FL/BPD:     76.7   %    71 - 87  FL:       58.2  mm     G. Age:  30w 3d          7  %    FL/AC:      19.5   %    20 - 24  HUM:      53.7  mm     G. Age:  31w 2d         35  %  Est. FW:    1919  gm      4 lb 4 oz     60  % ---------------------------------------------------------------------- Gestational Age  LMP:           31w 5d        Date:   07/20/16                 EDD:   04/26/17  U/S Today:     31w 2d                                        EDD:   04/29/17  Best:          32w 0d     Det. ByMarcella Dubs         EDD:   04/24/17                                      (08/31/16) ---------------------------------------------------------------------- Anatomy  Cranium:               Appears normal         Aortic Arch:            Previously seen  Cavum:                 Appears normal         Ductal Arch:            Previously seen  Ventricles:            Appears normal         Diaphragm:              Appears normal  Choroid Plexus:        Previously seen        Stomach:                Appears normal, left  sided  Cerebellum:            Previously seen        Abdomen:                Appears normal  Posterior Fossa:       Previously seen        Abdominal Wall:         Previously seen  Nuchal Fold:           Not applicable (>20    Cord Vessels:           Previously seen                         wks GA)  Face:                  Orbits and profile     Kidneys:                Previously seen                         previously seen  Lips:                  Previously seen        Bladder:                Appears normal  Thoracic:              Appears normal         Spine:                  Previously seen  Heart:                 Appears normal         Upper Extremities:      Previously seen                         (4CH, axis, and situs  RVOT:                  Previously seen        Lower Extremities:      Previously seen  LVOT:                  Previously seen  Other:  Technically difficult due to maternal habitus.  Fetus appears to be a          female. ---------------------------------------------------------------------- Cervix Uterus Adnexa  Cervix  Not visualized (advanced GA >29wks) ---------------------------------------------------------------------- Impression  SIUP at 32+0 weeks  Normal  interval anatomy; anatomic survey complete  Normal amniotic fluid volume  Appropriate interval growth with EFW at the 60th %tile; AC at  the 91st %tile  BPP 8/8 ---------------------------------------------------------------------- Recommendations  Follow-up ultrasound for growth in 4 weeks ----------------------------------------------------------------------                 Particia Nearing, MD Electronically Signed Final Report   02/27/2017 10:32 am ----------------------------------------------------------------------  [place] Mfm Ob Follow Up  Result Date: 02/27/2017 ----------------------------------------------------------------------  OBSTETRICS REPORT                      (Signed Final 02/27/2017 10:32 am) ---------------------------------------------------------------------- Patient Info  ID #:       [id number]  D.O.B.:  [birth year] (36 yrs)  Name:       Michelle GladeRYSTAL G Houston               Visit Date: 02/27/2017 09:32 am ---------------------------------------------------------------------- Performed By  Performed By:     Lenise ArenaHannah Bazemore        Ref. Address:     [address]                    [address]                                                             [address]                                                             [place]                                                             [id number]  Attending:        Particia NearingMartha Decker MD       [facility]  Referred By:      Roe CoombsACHELLE A                    DENNEY CNM ---------------------------------------------------------------------- Orders   #  Description                                 Code   1  US MFM OB FOLLOW UP                         [id number]   2  US MFM FETAL BPP WO NON STRESS              76819.01  ----------------------------------------------------------------------   #  Ordered By               Order #        Accession #    Episode #   1  Particia NearingMARTHA DECKER            [id number]      [phone]      [id number]   2  KELLY DAVIS              [id number]      [id number]     [id number]  ---------------------------------------------------------------------- Indications   [redacted] weeks gestation of pregnancy                Z3A.1432   Advanced maternal age multigravida 935+,        12O09.523   third trimester (low risk NIPS)   Pre-existing diabetes, type 2, in pregnancy,   O24.113   third trimester (glyburide, A1c 6.2 on 09/19/16)   Obesity complicating pregnancy,  third          O99.213   trimester (pregravid BMI 41.4)   Hypertension - Chronic/Pre-existing on         O10.019   labetalol and ASA  ---------------------------------------------------------------------- OB History  Blood Type:            Height:  5'1"   Weight (lb):  226       BMI:  42.7  Gravidity:    5         Term:   3        Prem:   0        SAB:   1  TOP:          0       Ectopic:  0        Living: 3 ---------------------------------------------------------------------- Fetal Evaluation  Num Of Fetuses:     1  Fetal Heart         154  Rate(bpm):  Cardiac Activity:   Observed  Presentation:       Cephalic  Placenta:           Anterior, above cervical os  P. Cord Insertion:  Previously Visualized  Amniotic Fluid  AFI FV:      Subjectively within normal limits  AFI Sum(cm)     %Tile       Largest Pocket(cm)  15.06           53          5.9  RUQ(cm)       RLQ(cm)       LUQ(cm)        LLQ(cm)  2.23          2.92          4.01           5.9 ---------------------------------------------------------------------- Biophysical Evaluation  Amniotic F.V:   Within normal limits       F. Tone:        Observed  F. Movement:    Observed                   Score:          8/8  F. Breathing:   Observed ---------------------------------------------------------------------- Biometry  BPD:      75.9  mm     G. Age:  30w 3d          7  %    CI:        74.09   %    70 - 86                                                          FL/HC:      20.8   %    19.1 - 21.3  HC:       280   mm     G.  Age:  30w 5d        < 3  %    HC/AC:      0.94        0.96 - 1.17  AC:      297.8  mm     G. Age:  33w 5d         91  %  FL/BPD:     76.7   %    71 - 87  FL:       58.2  mm     G. Age:  30w 3d          7  %    FL/AC:      19.5   %    20 - 24  HUM:      53.7  mm     G. Age:  31w 2d         35  %  Est. FW:    1919  gm      4 lb 4 oz     60  % ---------------------------------------------------------------------- Gestational Age  LMP:           31w 5d        Date:  07/20/16                 EDD:   04/26/17  U/S Today:     31w 2d                                        EDD:   04/29/17  Best:          32w 0d     Det. ByMarcella Dubs         EDD:   04/24/17                                      (08/31/16) ---------------------------------------------------------------------- Anatomy  Cranium:               Appears normal         Aortic Arch:            Previously seen  Cavum:                 Appears normal         Ductal Arch:            Previously seen  Ventricles:            Appears normal         Diaphragm:              Appears normal  Choroid Plexus:        Previously seen        Stomach:                Appears normal, left                                                                        sided  Cerebellum:            Previously seen        Abdomen:                Appears normal  Posterior Fossa:       Previously seen        Abdominal Wall:         Previously seen  Nuchal Fold:  Not applicable (>20    Cord Vessels:           Previously seen                         wks GA)  Face:                  Orbits and profile     Kidneys:                Previously seen                         previously seen  Lips:                  Previously seen        Bladder:                Appears normal  Thoracic:              Appears normal         Spine:                  Previously seen  Heart:                 Appears normal         Upper Extremities:      Previously seen                         (4CH, axis, and situs   RVOT:                  Previously seen        Lower Extremities:      Previously seen  LVOT:                  Previously seen  Other:  Technically difficult due to maternal habitus.  Fetus appears to be a          female. ---------------------------------------------------------------------- Cervix Uterus Adnexa  Cervix  Not visualized (advanced GA >29wks) ---------------------------------------------------------------------- Impression  SIUP at 32+0 weeks  Normal interval anatomy; anatomic survey complete  Normal amniotic fluid volume  Appropriate interval growth with EFW at the 60th %tile; AC at  the 91st %tile  BPP 8/8 ---------------------------------------------------------------------- Recommendations  Follow-up ultrasound for growth in 4 weeks ----------------------------------------------------------------------                 Particia Nearing, MD Electronically Signed Final Report   02/27/2017 10:32 am ----------------------------------------------------------------------   MAU COURSE Vitals and nursing notes reviewed I have ordered labs and reviewed them UA without signs of infection  No leukocytosis Electrolytes and renal function stable Treatments given in MAU: Zofran  MDM Plan of care reviewed with patient, including labs and tests ordered and medical treatment.   ASSESSMENT 1. Nausea vomiting and diarrhea   2. Viral gastroenteritis     PLAN Discharge home in stable condition Reassurance given Rx for Zofran  Counseled on return precautions Handout provided Follow-up with OB provider   Caryl Ada, DO [facility], [facility] 03/12/2017, 11:27 PM

## 2017-03-12 NOTE — MAU Note (Signed)
Pt states she ate some broccoli last night around 7:30pm. States that during the early morning hours she woke up with nausea/vomiting and diarrhea. States she has had 2 episodes of vomiting in the past 24 hours and has had 5-6 episodes of watery diarrhea in the past 24 hours. Pt denies fever or recent sick contacts. Pt denies contractions or vaginal bleeding. Reports good fetal movement.

## 2017-03-12 NOTE — MAU Provider Note (Signed)
Patient Keaunna Ashok CordiaG Yankey is a  36 y.o. [phone] at 1473w6d  Here with complaints of nausea and vomiting and diarrhea. This started last night; she thinks its food poisoning.  Denies vaginal discharge, decreased fetal movements or bleeding.   History     CSN: [id number]  Arrival date and time: 03/12/17 0015   None     Chief Complaint  Patient presents with  . Emesis  . Diarrhea   Emesis   This is a new problem. The current episode started today. The problem occurs 2 to 4 times per day. The problem has been resolved. Associated symptoms include diarrhea.  Diarrhea   This is a new problem. The current episode started today. The problem occurs 5 to 10 times per day. The problem has been resolved. Associated symptoms include vomiting. Risk factors include suspect food intake. She has tried nothing for the symptoms.   Patient thinks it was something that she ate. She cooked some broccoli and salisbury steak, thinks it was probably one of these foods.  OB History    Gravida Para Term Preterm AB Living   5 3 3   1 3    SAB TAB Ectopic Multiple Live Births   1     0 3      Past Medical History:  Diagnosis Date  . Bronchitis   . Diabetes mellitus   . Hypertension   . PCOS (polycystic ovarian syndrome)     Past Surgical History:  Procedure Laterality Date  . BLADDER REPAIR    . CARPAL TUNNEL RELEASE    . CHOLECYSTECTOMY      Family History  Problem Relation Age of Onset  . Cancer Maternal Grandmother   . Hypertension Mother   . Diabetes Paternal Aunt   . Stroke Maternal Grandfather   . Diabetes Paternal Grandmother   . Stroke Paternal Grandmother   . Cancer Father     Social History  Substance Use Topics  . Smoking status: Current Every Day Smoker    Packs/day: 0.50    Years: 18.00    Types: Cigarettes  . Smokeless tobacco: Never Used     Comment: 6 day  . Alcohol use No     Comment: rare     Allergies: No Known Allergies  Prescriptions Prior to Admission   Medication Sig Dispense Refill Last Dose  . ACCU-CHEK FASTCLIX LANCETS MISC 1 application by Does not apply route 4 (four) times daily -  with meals and at bedtime. 102 each 12 Taking  . acetaminophen (TYLENOL) 500 MG tablet Take 500 mg by mouth every 6 (six) hours as needed.   Taking  . albuterol (PROVENTIL HFA;VENTOLIN HFA) 108 (90 Base) MCG/ACT inhaler Inhale 2 puffs into the lungs every 6 (six) hours as needed for wheezing or shortness of breath.   Taking  . aspirin 81 MG chewable tablet Chew 1 tablet (81 mg total) by mouth daily. 30 tablet 12 Taking  . glucose blood (ACCU-CHEK GUIDE) test strip Use as instructed 100 each 12 Taking  . glyBURIDE (DIABETA) 2.5 MG tablet Take 1 tablet (2.5 mg total) by mouth at bedtime. 30 tablet 5 Taking  . labetalol (NORMODYNE) 200 MG tablet Take 1 tablet (200 mg total) by mouth 2 (two) times daily. 60 tablet 3 Taking  . Lancet Devices MISC 1 Device by Does not apply route 4 (four) times daily. 100 each 11 Taking  . omeprazole (PRILOSEC) 20 MG capsule Take 1 capsule (20 mg total) by mouth 2 (two)  times daily before a meal. 60 capsule 5 Taking  . Prenat-FeAsp-Meth-FA-DHA w/o A (PRENATE PIXIE) 10-0.6-0.4-200 MG CAPS Take 1 tablet by mouth daily. 30 capsule 12 Taking  . Vitamin D, Ergocalciferol, (DRISDOL) 50000 units CAPS capsule Take 1 capsule (50,000 Units total) by mouth every 7 (seven) days. 30 capsule 2 Taking    Review of Systems  Constitutional: Negative.   HENT: Negative.   Respiratory: Negative.   Gastrointestinal: Positive for diarrhea and vomiting.  Genitourinary: Negative for vaginal bleeding and vaginal pain.  Neurological: Negative.    Physical Exam   Blood pressure 116/71, pulse (!) 108, temperature 98 F (36.7 C), temperature source Oral, resp. rate 19, last menstrual period 07/20/2016, SpO2 100 %.  Physical Exam  Constitutional: She is oriented to person, place, and time. She appears well-developed and well-nourished.  HENT:  Head:  Normocephalic.  Neck: Normal range of motion.  Respiratory: Effort normal.  GI: Soft. She exhibits no distension and no mass. There is no tenderness. There is no rebound and no guarding.  Musculoskeletal: Normal range of motion.  Neurological: She is alert and oriented to person, place, and time.  Skin: Skin is warm and dry.  Psychiatric: She has a normal mood and affect.    MAU Course  Procedures  MDM -NST; baseline 140bpm, mod var, acels 15 by 15, no decels, no contractions.  -Patient has had no episode of nausea and vomiting since being in MAU. Very active and talkative, does not appear to be in any distress.  -Given 1 Liter of fluid, sprite and crackers. Patient feels good and wants to go home.   Assessment and Plan   1. Food poisoning, accidental or unintentional, initial encounter    2. Patient stable for discharge; recommended that patient continue her diabetic care and eat BRAT diet.  3. Reviewed warning signs and when to return to MAU.   Charlesetta Garibaldi  CNM 03/12/2017, 2:39 AM

## 2017-03-12 NOTE — MAU Note (Signed)
Patients presents to MAU with c/o diarrhea and vomiting. Patient was seen last night for the same reason. States that it has not gotten worse just has not gotten better.

## 2017-03-13 LAB — URINALYSIS, ROUTINE W REFLEX MICROSCOPIC
Bilirubin Urine: NEGATIVE
GLUCOSE, UA: NEGATIVE mg/dL
Hgb urine dipstick: NEGATIVE
KETONES UR: 5 mg/dL — AB
Leukocytes, UA: NEGATIVE
NITRITE: NEGATIVE
PH: 5 (ref 5.0–8.0)
PROTEIN: 100 mg/dL — AB
Specific Gravity, Urine: 1.026 (ref 1.005–1.030)

## 2017-03-13 LAB — COMPREHENSIVE METABOLIC PANEL
ALK PHOS: 134 U/L — AB (ref 38–126)
ALT: 30 U/L (ref 14–54)
AST: 28 U/L (ref 15–41)
Albumin: 3 g/dL — ABNORMAL LOW (ref 3.5–5.0)
Anion gap: 9 (ref 5–15)
BUN: 8 mg/dL (ref 6–20)
CALCIUM: 8.5 mg/dL — AB (ref 8.9–10.3)
CO2: 20 mmol/L — ABNORMAL LOW (ref 22–32)
CREATININE: 0.69 mg/dL (ref 0.44–1.00)
Chloride: 103 mmol/L (ref 101–111)
Glucose, Bld: 149 mg/dL — ABNORMAL HIGH (ref 65–99)
Potassium: 3.5 mmol/L (ref 3.5–5.1)
Sodium: 132 mmol/L — ABNORMAL LOW (ref 135–145)
Total Bilirubin: 0.3 mg/dL (ref 0.3–1.2)
Total Protein: 7 g/dL (ref 6.5–8.1)

## 2017-03-13 LAB — CBC
HCT: 38.3 % (ref 36.0–46.0)
HEMOGLOBIN: 12.7 g/dL (ref 12.0–15.0)
MCH: 29.1 pg (ref 26.0–34.0)
MCHC: 33.2 g/dL (ref 30.0–36.0)
MCV: 87.8 fL (ref 78.0–100.0)
Platelets: 242 10*3/uL (ref 150–400)
RBC: 4.36 MIL/uL (ref 3.87–5.11)
RDW: 13.5 % (ref 11.5–15.5)
WBC: 4.6 10*3/uL (ref 4.0–10.5)

## 2017-03-13 MED ORDER — ONDANSETRON 4 MG PO TBDP: 4.0000 mg | ORAL_TABLET | Freq: Three times a day (TID) | ORAL | 0 refills | Status: DC | PRN

## 2017-03-13 NOTE — Discharge Instructions (Signed)

## 2017-03-19 ENCOUNTER — Ambulatory Visit (INDEPENDENT_AMBULATORY_CARE_PROVIDER_SITE_OTHER): Payer: Medicaid Other | Admitting: *Deleted

## 2017-03-19 ENCOUNTER — Ambulatory Visit (INDEPENDENT_AMBULATORY_CARE_PROVIDER_SITE_OTHER): Payer: Medicaid Other | Admitting: Obstetrics and Gynecology

## 2017-03-19 ENCOUNTER — Ambulatory Visit: Payer: Self-pay

## 2017-03-19 VITALS — BP 110/62 | HR 80

## 2017-03-19 VITALS — BP 113/81 | HR 96 | Wt 225.0 lb

## 2017-03-19 DIAGNOSIS — O10913 Unspecified pre-existing hypertension complicating pregnancy, third trimester: Secondary | ICD-10-CM

## 2017-03-19 DIAGNOSIS — O24913 Unspecified diabetes mellitus in pregnancy, third trimester: Secondary | ICD-10-CM

## 2017-03-19 DIAGNOSIS — O10919 Unspecified pre-existing hypertension complicating pregnancy, unspecified trimester: Secondary | ICD-10-CM

## 2017-03-19 DIAGNOSIS — O0993 Supervision of high risk pregnancy, unspecified, third trimester: Secondary | ICD-10-CM

## 2017-03-19 DIAGNOSIS — O0990 Supervision of high risk pregnancy, unspecified, unspecified trimester: Secondary | ICD-10-CM

## 2017-03-19 DIAGNOSIS — O24919 Unspecified diabetes mellitus in pregnancy, unspecified trimester: Secondary | ICD-10-CM

## 2017-03-19 DIAGNOSIS — O09523 Supervision of elderly multigravida, third trimester: Secondary | ICD-10-CM

## 2017-03-19 NOTE — Progress Notes (Signed)

## 2017-03-19 NOTE — Progress Notes (Signed)
Subjective:  Edwinna G Sheerin is a 36 y.o. [phone] at 1028w6d being seen today for ongoing prenatal care.  She is currently monitored for the following issues for this high-risk pregnancy and has Supervision of high risk pregnancy, antepartum; Chronic hypertension; DM (diabetes mellitus) (HCC); S/P cholecystectomy; History of bladder repair surgery; H/O carpal tunnel repair; Obese; Low vitamin D level; Abnormal Pap smear of cervix; AMA (advanced maternal age) multigravida 35+; Diabetes mellitus affecting pregnancy, antepartum; and Chronic hypertension during pregnancy, antepartum on her problem list.  Patient reports no complaints.  Contractions: Not present. Vag. Bleeding: None.  Movement: Present. Denies leaking of fluid.   The following portions of the patient's history were reviewed and updated as appropriate: allergies, current medications, past family history, past medical history, past social history, past surgical history and problem list. Problem list updated.  Objective:   Vitals:   03/19/17 0910  BP: 113/81  Pulse: 96  Weight: 225 lb (102.1 kg)    Fetal Status: Fetal Heart Rate (bpm): 152   Movement: Present     General:  Alert, oriented and cooperative. Patient is in no acute distress.  Skin: Skin is warm and dry. No rash noted.   Cardiovascular: Normal heart rate noted  Respiratory: Normal respiratory effort, no problems with respiration noted  Abdomen: Soft, gravid, appropriate for gestational age. Pain/Pressure: Present     Pelvic:  Cervical exam deferred        Extremities: Normal range of motion.  Edema: None  Mental Status: Normal mood and affect. Normal behavior. Normal judgment and thought content.   Urinalysis:      Assessment and Plan:  Pregnancy: [id number] at 628w6d  1. Supervision of high risk pregnancy, antepartum Stable GBS and vaginal cultures next week  2. Elderly multigravida in third trimester Nl Panorama  3. Chronic hypertension during pregnancy,  antepartum BP stable Continue with Labetalol and BASA Continue with antenatal testing  4. Diabetes mellitus affecting pregnancy, antepartum BS in goal range for most part. Elevated ones related to diet choices. Taking Glyburide qd d/t to episodes of hypoglycemia in the mornings Has improved with change ECHO completed Did not go to Opth appt reason for appt reviewed with pt Encouraged to reschedule Continue with antenatal testing Growth scan 02/27/17 1919 gm EFW 60%   Preterm labor symptoms and general obstetric precautions including but not limited to vaginal bleeding, contractions, leaking of fluid and fetal movement were reviewed in detail with the patient. Please refer to After Visit Summary for other counseling recommendations.  Return in about 1 week (around 03/26/2017) for OB visit.   Hermina StaggersErvin, Chelci [place] L, MD

## 2017-03-26 ENCOUNTER — Encounter: Payer: Self-pay | Admitting: Obstetrics and Gynecology

## 2017-03-26 ENCOUNTER — Ambulatory Visit (INDEPENDENT_AMBULATORY_CARE_PROVIDER_SITE_OTHER): Payer: Medicaid Other | Admitting: Obstetrics and Gynecology

## 2017-03-26 ENCOUNTER — Ambulatory Visit (HOSPITAL_COMMUNITY)
Admission: RE | Admit: 2017-03-26 | Discharge: 2017-03-26 | Disposition: A | Discharge status: Home / Self Care | Payer: Medicaid Other | Source: Ambulatory Visit | Attending: Maternal and Fetal Medicine | Admitting: Maternal and Fetal Medicine

## 2017-03-26 ENCOUNTER — Encounter (HOSPITAL_COMMUNITY): Payer: Self-pay

## 2017-03-26 VITALS — BP 111/78 | HR 93 | Wt 226.0 lb

## 2017-03-26 DIAGNOSIS — O10919 Unspecified pre-existing hypertension complicating pregnancy, unspecified trimester: Secondary | ICD-10-CM

## 2017-03-26 DIAGNOSIS — Z7984 Long term (current) use of oral hypoglycemic drugs: Secondary | ICD-10-CM | POA: Insufficient documentation

## 2017-03-26 DIAGNOSIS — O10013 Pre-existing essential hypertension complicating pregnancy, third trimester: Secondary | ICD-10-CM | POA: Diagnosis not present

## 2017-03-26 DIAGNOSIS — O24913 Unspecified diabetes mellitus in pregnancy, third trimester: Secondary | ICD-10-CM

## 2017-03-26 DIAGNOSIS — Z7982 Long term (current) use of aspirin: Secondary | ICD-10-CM | POA: Insufficient documentation

## 2017-03-26 DIAGNOSIS — Z3A35 35 weeks gestation of pregnancy: Secondary | ICD-10-CM | POA: Insufficient documentation

## 2017-03-26 DIAGNOSIS — O09523 Supervision of elderly multigravida, third trimester: Secondary | ICD-10-CM

## 2017-03-26 DIAGNOSIS — O0993 Supervision of high risk pregnancy, unspecified, third trimester: Secondary | ICD-10-CM | POA: Diagnosis not present

## 2017-03-26 DIAGNOSIS — O24113 Pre-existing diabetes mellitus, type 2, in pregnancy, third trimester: Secondary | ICD-10-CM | POA: Diagnosis present

## 2017-03-26 DIAGNOSIS — O99213 Obesity complicating pregnancy, third trimester: Secondary | ICD-10-CM | POA: Insufficient documentation

## 2017-03-26 DIAGNOSIS — O24919 Unspecified diabetes mellitus in pregnancy, unspecified trimester: Secondary | ICD-10-CM

## 2017-03-26 DIAGNOSIS — O24119 Pre-existing diabetes mellitus, type 2, in pregnancy, unspecified trimester: Secondary | ICD-10-CM

## 2017-03-26 DIAGNOSIS — O10913 Unspecified pre-existing hypertension complicating pregnancy, third trimester: Secondary | ICD-10-CM

## 2017-03-26 DIAGNOSIS — O0990 Supervision of high risk pregnancy, unspecified, unspecified trimester: Secondary | ICD-10-CM

## 2017-03-26 NOTE — Progress Notes (Signed)
NST DX GDM 

## 2017-03-26 NOTE — Progress Notes (Signed)
Subjective:  Michelle Houston is a 36 y.o. [phone] at 732w6d being seen today for ongoing prenatal care.  She is currently monitored for the following issues for this high-risk pregnancy and has Supervision of high risk pregnancy, antepartum; Chronic hypertension; DM (diabetes mellitus) (HCC); S/P cholecystectomy; History of bladder repair surgery; H/O carpal tunnel repair; Obese; Low vitamin D level; Abnormal Pap smear of cervix; AMA (advanced maternal age) multigravida 35+; Diabetes mellitus affecting pregnancy, antepartum; and Chronic hypertension during pregnancy, antepartum on their problem list.  Patient reports no complaints.  Contractions: Irregular. Vag. Bleeding: None.  Movement: Present. Denies leaking of fluid.   The following portions of the patient's history were reviewed and updated as appropriate: allergies, current medications, past family history, past medical history, past social history, past surgical history and problem list. Problem list updated.  Objective:   Vitals:   03/26/17 0914  BP: 111/78  Pulse: 93  Weight: 226 lb (102.5 kg)    Fetal Status:     Movement: Present     General:  Alert, oriented and cooperative. Patient is in no acute distress.  Skin: Skin is warm and dry. No rash noted.   Cardiovascular: Normal heart rate noted  Respiratory: Normal respiratory effort, no problems with respiration noted  Abdomen: Soft, gravid, appropriate for gestational age. Pain/Pressure: Present     Pelvic:  Cervical exam deferred        Extremities: Normal range of motion.  Edema: None  Mental Status: Normal mood and affect. Normal behavior. Normal judgment and thought content.   Urinalysis:      Assessment and Plan:  Pregnancy: [id number] at 772w6d  1. Supervision of high risk pregnancy, antepartum Stable Will need GBS at next visit  2. Elderly multigravida in third trimester Nl Panorama  3. Diabetes mellitus affecting pregnancy, antepartum BS have increased Will  Increased Glyburide to bid Continue with antenatal testing - Fetal nonstress test  4. Chronic hypertension during pregnancy, antepartum Stable Continue with current treatment - Fetal nonstress test  Preterm labor symptoms and general obstetric precautions including but not limited to vaginal bleeding, contractions, leaking of fluid and fetal movement were reviewed in detail with the patient. Please refer to After Visit Summary for other counseling recommendations.  Return in about 1 week (around 04/02/2017) for OB visit.   Hermina StaggersErvin, Michael L, MD

## 2017-03-27 ENCOUNTER — Ambulatory Visit (HOSPITAL_COMMUNITY): Payer: Medicaid Other

## 2017-04-02 ENCOUNTER — Ambulatory Visit: Payer: Self-pay

## 2017-04-02 ENCOUNTER — Other Ambulatory Visit (HOSPITAL_COMMUNITY)
Admission: RE | Admit: 2017-04-02 | Discharge: 2017-04-02 | Disposition: A | Discharge status: Home / Self Care | Payer: Medicaid Other | Source: Ambulatory Visit | Attending: Obstetrics and Gynecology | Admitting: Obstetrics and Gynecology

## 2017-04-02 ENCOUNTER — Ambulatory Visit (INDEPENDENT_AMBULATORY_CARE_PROVIDER_SITE_OTHER): Payer: Medicaid Other | Admitting: Obstetrics and Gynecology

## 2017-04-02 ENCOUNTER — Encounter: Payer: Self-pay | Admitting: Obstetrics and Gynecology

## 2017-04-02 ENCOUNTER — Ambulatory Visit (INDEPENDENT_AMBULATORY_CARE_PROVIDER_SITE_OTHER): Payer: Medicaid Other | Admitting: *Deleted

## 2017-04-02 VITALS — BP 112/76 | HR 104

## 2017-04-02 VITALS — BP 124/79 | HR 98 | Wt 222.0 lb

## 2017-04-02 DIAGNOSIS — Z3A36 36 weeks gestation of pregnancy: Secondary | ICD-10-CM | POA: Insufficient documentation

## 2017-04-02 DIAGNOSIS — O24919 Unspecified diabetes mellitus in pregnancy, unspecified trimester: Secondary | ICD-10-CM

## 2017-04-02 DIAGNOSIS — O0993 Supervision of high risk pregnancy, unspecified, third trimester: Secondary | ICD-10-CM

## 2017-04-02 DIAGNOSIS — O10913 Unspecified pre-existing hypertension complicating pregnancy, third trimester: Secondary | ICD-10-CM

## 2017-04-02 DIAGNOSIS — O24913 Unspecified diabetes mellitus in pregnancy, third trimester: Secondary | ICD-10-CM

## 2017-04-02 DIAGNOSIS — O0990 Supervision of high risk pregnancy, unspecified, unspecified trimester: Secondary | ICD-10-CM

## 2017-04-02 DIAGNOSIS — O10919 Unspecified pre-existing hypertension complicating pregnancy, unspecified trimester: Secondary | ICD-10-CM

## 2017-04-02 DIAGNOSIS — O09523 Supervision of elderly multigravida, third trimester: Secondary | ICD-10-CM

## 2017-04-02 LAB — OB RESULTS CONSOLE GC/CHLAMYDIA: Gonorrhea: NEGATIVE

## 2017-04-02 MED ORDER — GLYBURIDE 2.5 MG PO TABS: 5.0000 mg | ORAL_TABLET | Freq: Two times a day (BID) | ORAL | 3 refills | Status: DC

## 2017-04-02 NOTE — Progress Notes (Signed)
Subjective:  Michelle GladeCrystal G Garry is a 36 y.o. [phone] at 7529w6d being seen today for ongoing prenatal care.  She is currently monitored for the following issues for this high-risk pregnancy and has Supervision of high risk pregnancy, antepartum; Chronic hypertension; DM (diabetes mellitus) (HCC); S/P cholecystectomy; History of bladder repair surgery; H/O carpal tunnel repair; Obese; Low vitamin D level; Abnormal Pap smear of cervix; AMA (advanced maternal age) multigravida 35+; Diabetes mellitus affecting pregnancy, antepartum; and Chronic hypertension during pregnancy, antepartum on their problem list.  Patient reports no complaints.  Contractions: Irregular. Vag. Bleeding: None.  Movement: Present. Denies leaking of fluid.   The following portions of the patient's history were reviewed and updated as appropriate: allergies, current medications, past family history, past medical history, past social history, past surgical history and problem list. Problem list updated.  Objective:   Vitals:   04/02/17 0914  BP: 124/79  Pulse: 98  Weight: 222 lb (100.7 kg)    Fetal Status: Fetal Heart Rate (bpm): 144   Movement: Present     General:  Alert, oriented and cooperative. Patient is in no acute distress.  Skin: Skin is warm and dry. No rash noted.   Cardiovascular: Normal heart rate noted  Respiratory: Normal respiratory effort, no problems with respiration noted  Abdomen: Soft, gravid, appropriate for gestational age. Pain/Pressure: Present     Pelvic:  Cervical exam performed        Extremities: Normal range of motion.  Edema: None  Mental Status: Normal mood and affect. Normal behavior. Normal judgment and thought content.   Urinalysis:      Assessment and Plan:  Pregnancy: [id number] at 7529w6d  1. Chronic hypertension during pregnancy, antepartum BP stable without meds Stop BASA  2. Diabetes mellitus affecting pregnancy, antepartum BS improved with change in Glyburide but still not in  goal range Will increase to 5 mg bid, diet choices reviewed  BPP/NST today and q weekly IOL at 39 weeks  3. Supervision of high risk pregnancy, antepartum Stable Labor precautions IUD for contraception  - Strep Gp B NAA - Cervicovaginal ancillary only  4. Elderly multigravida in third trimester   Preterm labor symptoms and general obstetric precautions including but not limited to vaginal bleeding, contractions, leaking of fluid and fetal movement were reviewed in detail with the patient. Please refer to After Visit Summary for other counseling recommendations.  Return in about 1 week (around 04/09/2017) for OB visit.   Hermina StaggersErvin, Michael L, MD

## 2017-04-02 NOTE — Progress Notes (Signed)
Pt informed that the ultrasound is considered a limited OB ultrasound and is not intended to be a complete ultrasound exam.  Patient also informed that the ultrasound is not being completed with the intent of assessing for fetal or placental anomalies or any pelvic abnormalities.  Explained that the purpose of today's ultrasound is to assess for presentation, BPP and amniotic fluid volume.  Patient acknowledges the purpose of the exam and the limitations of the study.  Pt encouraged to increase po water intake due to low normal AFI.     Pt had Ob visit w/Dr. Alysia PennaErvin today @ [facility].

## 2017-04-03 LAB — CERVICOVAGINAL ANCILLARY ONLY
Bacterial vaginitis: NEGATIVE
Candida vaginitis: NEGATIVE
Chlamydia: NEGATIVE
Neisseria Gonorrhea: NEGATIVE
TRICH (WINDOWPATH): NEGATIVE

## 2017-04-04 ENCOUNTER — Telehealth (HOSPITAL_COMMUNITY): Payer: Self-pay | Admitting: *Deleted

## 2017-04-04 ENCOUNTER — Encounter (HOSPITAL_COMMUNITY): Payer: Self-pay | Admitting: *Deleted

## 2017-04-04 LAB — STREP GP B NAA: STREP GROUP B AG: NEGATIVE

## 2017-04-04 NOTE — Telephone Encounter (Signed)
Preadmission screen  

## 2017-04-09 ENCOUNTER — Ambulatory Visit: Payer: Self-pay

## 2017-04-09 ENCOUNTER — Ambulatory Visit (INDEPENDENT_AMBULATORY_CARE_PROVIDER_SITE_OTHER): Payer: Medicaid Other | Admitting: *Deleted

## 2017-04-09 ENCOUNTER — Ambulatory Visit (INDEPENDENT_AMBULATORY_CARE_PROVIDER_SITE_OTHER): Payer: Medicaid Other | Admitting: Obstetrics and Gynecology

## 2017-04-09 ENCOUNTER — Encounter: Payer: Self-pay | Admitting: Obstetrics and Gynecology

## 2017-04-09 VITALS — BP 121/80 | HR 89 | Wt 222.0 lb

## 2017-04-09 DIAGNOSIS — O10913 Unspecified pre-existing hypertension complicating pregnancy, third trimester: Secondary | ICD-10-CM

## 2017-04-09 DIAGNOSIS — O24919 Unspecified diabetes mellitus in pregnancy, unspecified trimester: Secondary | ICD-10-CM

## 2017-04-09 DIAGNOSIS — O10919 Unspecified pre-existing hypertension complicating pregnancy, unspecified trimester: Secondary | ICD-10-CM

## 2017-04-09 DIAGNOSIS — O09523 Supervision of elderly multigravida, third trimester: Secondary | ICD-10-CM

## 2017-04-09 DIAGNOSIS — O0990 Supervision of high risk pregnancy, unspecified, unspecified trimester: Secondary | ICD-10-CM

## 2017-04-09 DIAGNOSIS — O0993 Supervision of high risk pregnancy, unspecified, third trimester: Secondary | ICD-10-CM

## 2017-04-09 DIAGNOSIS — O24913 Unspecified diabetes mellitus in pregnancy, third trimester: Secondary | ICD-10-CM

## 2017-04-09 NOTE — Progress Notes (Signed)
Patient reports good fetal movement with some pressure, denies contractions. 

## 2017-04-09 NOTE — Progress Notes (Signed)

## 2017-04-09 NOTE — Progress Notes (Signed)
Subjective:  Wilder GladeCrystal G Essman is a 36 y.o. [id number] at 3755w6d being seen today for ongoing prenatal care.  She is currently monitored for the following issues for this high-risk pregnancy and has Supervision of high risk pregnancy, antepartum; Chronic hypertension; DM (diabetes mellitus) (HCC); S/P cholecystectomy; History of bladder repair surgery; H/O carpal tunnel repair; Obese; Low vitamin D level; Abnormal Pap smear of cervix; AMA (advanced maternal age) multigravida 35+; Diabetes mellitus affecting pregnancy, antepartum; and Chronic hypertension during pregnancy, antepartum on their problem list.  Patient reports no complaints.  Contractions: Not present. Vag. Bleeding: None.  Movement: Present. Denies leaking of fluid.   The following portions of the patient's history were reviewed and updated as appropriate: allergies, current medications, past family history, past medical history, past social history, past surgical history and problem list. Problem list updated.  Objective:   Vitals:   04/09/17 0927  BP: 121/80  Pulse: 89  Weight: 222 lb (100.7 kg)    Fetal Status: Fetal Heart Rate (bpm): 132   Movement: Present     General:  Alert, oriented and cooperative. Patient is in no acute distress.  Skin: Skin is warm and dry. No rash noted.   Cardiovascular: Normal heart rate noted  Respiratory: Normal respiratory effort, no problems with respiration noted  Abdomen: Soft, gravid, appropriate for gestational age. Pain/Pressure: Present     Pelvic:  Cervical exam performed        Extremities: Normal range of motion.  Edema: None  Mental Status: Normal mood and affect. Normal behavior. Normal judgment and thought content.   Urinalysis:      Assessment and Plan:  Pregnancy: [id number] at 3255w6d  1. Supervision of high risk pregnancy, antepartum Stable Labor precautions  2. Diabetes mellitus affecting pregnancy, antepartum BS improved with Bid glyburibe Continue with diet and  medicine Antenatal testing as ordered  3. Chronic hypertension during pregnancy, antepartum BP stable Without meds  4. Elderly multigravida in third trimester Nl Panorama  Term labor symptoms and general obstetric precautions including but not limited to vaginal bleeding, contractions, leaking of fluid and fetal movement were reviewed in detail with the patient. Please refer to After Visit Summary for other counseling recommendations.  Return in about 1 week (around 04/16/2017) for OB visit.   Hermina StaggersErvin, Tamani Durney L, MD

## 2017-04-13 ENCOUNTER — Inpatient Hospital Stay (HOSPITAL_COMMUNITY)
Admission: AD | Admit: 2017-04-13 | Discharge: 2017-04-13 | Disposition: A | Discharge status: Home / Self Care | Payer: Medicaid Other | Source: Ambulatory Visit | Attending: Obstetrics and Gynecology | Admitting: Obstetrics and Gynecology

## 2017-04-13 ENCOUNTER — Inpatient Hospital Stay (HOSPITAL_COMMUNITY): Payer: Medicaid Other

## 2017-04-13 ENCOUNTER — Encounter (HOSPITAL_COMMUNITY): Payer: Self-pay | Admitting: *Deleted

## 2017-04-13 DIAGNOSIS — O288 Other abnormal findings on antenatal screening of mother: Secondary | ICD-10-CM | POA: Diagnosis present

## 2017-04-13 DIAGNOSIS — O99333 Smoking (tobacco) complicating pregnancy, third trimester: Secondary | ICD-10-CM | POA: Insufficient documentation

## 2017-04-13 DIAGNOSIS — E119 Type 2 diabetes mellitus without complications: Secondary | ICD-10-CM | POA: Insufficient documentation

## 2017-04-13 DIAGNOSIS — Z3A38 38 weeks gestation of pregnancy: Secondary | ICD-10-CM | POA: Diagnosis not present

## 2017-04-13 DIAGNOSIS — O368130 Decreased fetal movements, third trimester, not applicable or unspecified: Secondary | ICD-10-CM | POA: Diagnosis not present

## 2017-04-13 DIAGNOSIS — O163 Unspecified maternal hypertension, third trimester: Secondary | ICD-10-CM | POA: Diagnosis not present

## 2017-04-13 DIAGNOSIS — E282 Polycystic ovarian syndrome: Secondary | ICD-10-CM | POA: Insufficient documentation

## 2017-04-13 DIAGNOSIS — O24113 Pre-existing diabetes mellitus, type 2, in pregnancy, third trimester: Secondary | ICD-10-CM | POA: Insufficient documentation

## 2017-04-13 DIAGNOSIS — Z7982 Long term (current) use of aspirin: Secondary | ICD-10-CM | POA: Diagnosis not present

## 2017-04-13 DIAGNOSIS — I10 Essential (primary) hypertension: Secondary | ICD-10-CM | POA: Insufficient documentation

## 2017-04-13 DIAGNOSIS — Z79899 Other long term (current) drug therapy: Secondary | ICD-10-CM | POA: Insufficient documentation

## 2017-04-13 NOTE — Discharge Instructions (Signed)

## 2017-04-13 NOTE — MAU Note (Signed)
Baby not moving since earlier

## 2017-04-13 NOTE — MAU Provider Note (Signed)
History     CSN: [id number]  Arrival date and time: 04/13/17 0144   First Provider Initiated Contact with Patient 04/13/17 0201     Chief Complaint  Patient presents with  . Decreased Fetal Movement   HPI Michelle Houston is a 36 y.o. [id number] at 2552w3d who presents with decreased fetal movement. She states she hasn't felt the baby move since 12pm. She denies any vaginal bleeding, leaking or abnormal discharge. She denies any pain.  OB History    Gravida Para Term Preterm AB Living   5 3 3   1 3    SAB TAB Ectopic Multiple Live Births   1     0 3      Past Medical History:  Diagnosis Date  . Bronchitis   . Diabetes mellitus    type 2 chronic  . Hypertension   . PCOS (polycystic ovarian syndrome)     Past Surgical History:  Procedure Laterality Date  . BLADDER REPAIR    . CARPAL TUNNEL RELEASE    . CHOLECYSTECTOMY      Family History  Problem Relation Age of Onset  . Cancer Maternal Grandmother   . Hypertension Mother   . Fibroids Mother   . Diabetes Paternal Aunt   . Stroke Maternal Grandfather   . Diabetes Paternal Grandmother   . Stroke Paternal Grandmother   . Cancer Father        lung  . Heart disease Paternal Grandfather     Social History   Tobacco Use  . Smoking status: Current Every Day Smoker    Packs/day: 0.50    Years: 18.00    Pack years: 9.00    Types: Cigarettes  . Smokeless tobacco: Never Used  . Tobacco comment: 6 day  Substance Use Topics  . Alcohol use: No    Alcohol/week: 0.0 oz    Comment: rare   . Drug use: No    Allergies: No Known Allergies  Medications Prior to Admission  Medication Sig Dispense Refill Last Dose  . ACCU-CHEK FASTCLIX LANCETS MISC 1 application by Does not apply route 4 (four) times daily -  with meals and at bedtime. 102 each 12 Taking  . acetaminophen (TYLENOL) 500 MG tablet Take 500 mg by mouth every 6 (six) hours as needed.   Taking  . albuterol (PROVENTIL HFA;VENTOLIN HFA) 108 (90 Base) MCG/ACT  inhaler Inhale 2 puffs into the lungs every 6 (six) hours as needed for wheezing or shortness of breath.   Taking  . aspirin 81 MG chewable tablet Chew 1 tablet (81 mg total) by mouth daily. 30 tablet 12 Taking  . glucose blood (ACCU-CHEK GUIDE) test strip Use as instructed 100 each 12 Taking  . glyBURIDE (DIABETA) 2.5 MG tablet Take 2 tablets (5 mg total) 2 (two) times daily with a meal by mouth. 60 tablet 3 Taking  . labetalol (NORMODYNE) 200 MG tablet Take 1 tablet (200 mg total) by mouth 2 (two) times daily. 60 tablet 3 Taking  . Lancet Devices MISC 1 Device by Does not apply route 4 (four) times daily. 100 each 11 Taking  . omeprazole (PRILOSEC) 20 MG capsule Take 1 capsule (20 mg total) by mouth 2 (two) times daily before a meal. 60 capsule 5 Taking  . ondansetron (ZOFRAN-ODT) 4 MG disintegrating tablet Take 1 tablet (4 mg total) by mouth every 8 (eight) hours as needed for nausea or vomiting. (Patient not taking: Reported on 04/09/2017) 20 tablet 0 Not Taking  . Prenat-FeAsp-Meth-FA-DHA  w/o A (PRENATE PIXIE) 10-0.6-0.4-200 MG CAPS Take 1 tablet by mouth daily. 30 capsule 12 Taking  . Vitamin D, Ergocalciferol, (DRISDOL) 50000 units CAPS capsule Take 1 capsule (50,000 Units total) by mouth every 7 (seven) days. 30 capsule 2 Taking    Review of Systems  Constitutional: Negative.  Negative for fatigue and fever.  HENT: Negative.   Respiratory: Negative.  Negative for shortness of breath.   Cardiovascular: Negative.  Negative for chest pain.  Gastrointestinal: Negative.  Negative for abdominal pain, constipation, diarrhea, nausea and vomiting.  Genitourinary: Negative.  Negative for dysuria.  Neurological: Negative.  Negative for dizziness and headaches.   Physical Exam   Blood pressure 121/74, pulse (!) 101, temperature 97.9 F (36.6 C), temperature source Oral, resp. rate 20, last menstrual period 07/20/2016, SpO2 99 %.  Physical Exam  Nursing note and vitals  reviewed. Constitutional: She is oriented to person, place, and time. She appears well-developed and well-nourished. No distress.  HENT:  Head: Normocephalic.  Eyes: Pupils are equal, round, and reactive to light.  Cardiovascular: Normal rate, regular rhythm and normal heart sounds.  Respiratory: Effort normal and breath sounds normal. No respiratory distress.  GI: Soft. Bowel sounds are normal. She exhibits no distension. There is no tenderness.  Neurological: She is alert and oriented to person, place, and time.  Skin: Skin is warm and dry.  Psychiatric: She has a normal mood and affect. Her behavior is normal. Judgment and thought content normal.   Fetal Tracing:  Baseline: 150 Variability: moderate Accels: 10x10 Decels: none  Toco: none  MAU Course  Procedures  MDM NST- reassuring but not reactive Patient reports feeling fetal movement since arrival in MAU BPP- 8/8, AFI 9.95cm  NST reactive after u/s and patient still feeling movement  Assessment and Plan   1. Decreased fetal movements in third trimester, single or unspecified fetus   2. Non-reactive NST (non-stress test)    -Discharge home in stable condition -Fetal kick counts discussed -[facility] -Patient may return to MAU as needed or if her condition were to change or worsen  Rolm BookbinderCaroline M Judene Logue CNM 04/13/2017, 2:08 AM

## 2017-04-16 ENCOUNTER — Encounter (HOSPITAL_COMMUNITY): Payer: Self-pay | Admitting: *Deleted

## 2017-04-16 ENCOUNTER — Ambulatory Visit (INDEPENDENT_AMBULATORY_CARE_PROVIDER_SITE_OTHER): Payer: Medicaid Other | Admitting: Obstetrics and Gynecology

## 2017-04-16 ENCOUNTER — Encounter (HOSPITAL_COMMUNITY): Payer: Self-pay | Admitting: Certified Registered Nurse Anesthetist

## 2017-04-16 ENCOUNTER — Other Ambulatory Visit: Payer: Self-pay

## 2017-04-16 ENCOUNTER — Inpatient Hospital Stay (HOSPITAL_COMMUNITY)
Admission: AD | Admit: 2017-04-16 | Discharge: 2017-04-18 | DRG: 805 | Disposition: A | Discharge status: Home / Self Care | Payer: Medicaid Other | Source: Ambulatory Visit | Attending: Family Medicine | Admitting: Family Medicine

## 2017-04-16 ENCOUNTER — Inpatient Hospital Stay (HOSPITAL_COMMUNITY): Payer: Medicaid Other | Admitting: Anesthesiology

## 2017-04-16 VITALS — BP 112/76 | HR 92 | Wt 223.0 lb

## 2017-04-16 DIAGNOSIS — F17210 Nicotine dependence, cigarettes, uncomplicated: Secondary | ICD-10-CM | POA: Diagnosis present

## 2017-04-16 DIAGNOSIS — Z7982 Long term (current) use of aspirin: Secondary | ICD-10-CM | POA: Diagnosis not present

## 2017-04-16 DIAGNOSIS — I10 Essential (primary) hypertension: Secondary | ICD-10-CM | POA: Diagnosis present

## 2017-04-16 DIAGNOSIS — O99214 Obesity complicating childbirth: Secondary | ICD-10-CM | POA: Diagnosis present

## 2017-04-16 DIAGNOSIS — E119 Type 2 diabetes mellitus without complications: Secondary | ICD-10-CM | POA: Diagnosis present

## 2017-04-16 DIAGNOSIS — Z7984 Long term (current) use of oral hypoglycemic drugs: Secondary | ICD-10-CM | POA: Diagnosis not present

## 2017-04-16 DIAGNOSIS — Z3A38 38 weeks gestation of pregnancy: Secondary | ICD-10-CM

## 2017-04-16 DIAGNOSIS — O2412 Pre-existing diabetes mellitus, type 2, in childbirth: Secondary | ICD-10-CM | POA: Diagnosis present

## 2017-04-16 DIAGNOSIS — O99334 Smoking (tobacco) complicating childbirth: Secondary | ICD-10-CM | POA: Diagnosis present

## 2017-04-16 DIAGNOSIS — O10913 Unspecified pre-existing hypertension complicating pregnancy, third trimester: Secondary | ICD-10-CM | POA: Diagnosis not present

## 2017-04-16 DIAGNOSIS — Z9189 Other specified personal risk factors, not elsewhere classified: Secondary | ICD-10-CM

## 2017-04-16 DIAGNOSIS — O288 Other abnormal findings on antenatal screening of mother: Secondary | ICD-10-CM

## 2017-04-16 DIAGNOSIS — Z9049 Acquired absence of other specified parts of digestive tract: Secondary | ICD-10-CM

## 2017-04-16 DIAGNOSIS — O1002 Pre-existing essential hypertension complicating childbirth: Secondary | ICD-10-CM | POA: Diagnosis present

## 2017-04-16 DIAGNOSIS — O10919 Unspecified pre-existing hypertension complicating pregnancy, unspecified trimester: Secondary | ICD-10-CM

## 2017-04-16 LAB — GLUCOSE, CAPILLARY
Glucose-Capillary: 58 mg/dL — ABNORMAL LOW (ref 65–99)
Glucose-Capillary: 67 mg/dL (ref 65–99)
Glucose-Capillary: 94 mg/dL (ref 65–99)
Glucose-Capillary: 122 mg/dL — ABNORMAL HIGH (ref 65–99)
Glucose-Capillary: 131 mg/dL — ABNORMAL HIGH (ref 65–99)
Glucose-Capillary: 95 mg/dL (ref 65–99)

## 2017-04-16 LAB — CBC
HEMATOCRIT: 40.1 % (ref 36.0–46.0)
Hemoglobin: 13.4 g/dL (ref 12.0–15.0)
MCH: 29.6 pg (ref 26.0–34.0)
MCHC: 33.4 g/dL (ref 30.0–36.0)
MCV: 88.7 fL (ref 78.0–100.0)
Platelets: 231 10*3/uL (ref 150–400)
RBC: 4.52 MIL/uL (ref 3.87–5.11)
RDW: 13.4 % (ref 11.5–15.5)
WBC: 4.9 10*3/uL (ref 4.0–10.5)

## 2017-04-16 LAB — TYPE AND SCREEN
ABO/RH(D): A POS
Antibody Screen: NEGATIVE

## 2017-04-16 MED ORDER — TERBUTALINE SULFATE 1 MG/ML IJ SOLN
0.2500 mg | Freq: Once | INTRAMUSCULAR | Status: DC | PRN
  Filled 2017-04-16: qty 1

## 2017-04-16 MED ORDER — ACETAMINOPHEN 325 MG PO TABS: 650.0000 mg | ORAL_TABLET | ORAL | Status: DC | PRN

## 2017-04-16 MED ORDER — WITCH HAZEL-GLYCERIN EX PADS: 1.0000 "application " | MEDICATED_PAD | CUTANEOUS | Status: DC | PRN

## 2017-04-16 MED ORDER — IBUPROFEN 600 MG PO TABS
600.0000 mg | ORAL_TABLET | Freq: Four times a day (QID) | ORAL | Status: DC
  Administered 2017-04-17 – 2017-04-18 (×7): 600 mg via ORAL
  Filled 2017-04-16 (×7): qty 1

## 2017-04-16 MED ORDER — LACTATED RINGERS IV SOLN
INTRAVENOUS | Status: DC
  Administered 2017-04-16: 12:00:00 via INTRAVENOUS

## 2017-04-16 MED ORDER — SIMETHICONE 80 MG PO CHEW
80.0000 mg | CHEWABLE_TABLET | ORAL | Status: DC | PRN
  Administered 2017-04-18 (×2): 80 mg via ORAL
  Filled 2017-04-16: qty 1

## 2017-04-16 MED ORDER — TETANUS-DIPHTH-ACELL PERTUSSIS 5-2.5-18.5 LF-MCG/0.5 IM SUSP: 0.5000 mL | Freq: Once | INTRAMUSCULAR | Status: DC

## 2017-04-16 MED ORDER — LIDOCAINE HCL (PF) 1 % IJ SOLN
INTRAMUSCULAR | Status: DC | PRN
  Administered 2017-04-16: 7 mL via EPIDURAL
  Administered 2017-04-16: 4 mL via EPIDURAL

## 2017-04-16 MED ORDER — FENTANYL 2.5 MCG/ML BUPIVACAINE 1/10 % EPIDURAL INFUSION (WH - ANES)
14.0000 mL/h | INTRAMUSCULAR | Status: DC | PRN
  Administered 2017-04-16: 14 mL/h via EPIDURAL
  Filled 2017-04-16: qty 100

## 2017-04-16 MED ORDER — MISOPROSTOL 200 MCG PO TABS
50.0000 ug | ORAL_TABLET | ORAL | Status: DC | PRN
  Administered 2017-04-16: 50 ug via ORAL
  Filled 2017-04-16: qty 1

## 2017-04-16 MED ORDER — LACTATED RINGERS IV SOLN
500.0000 mL | Freq: Once | INTRAVENOUS | Status: AC
  Administered 2017-04-16: 500 mL via INTRAVENOUS

## 2017-04-16 MED ORDER — EPHEDRINE 5 MG/ML INJ
10.0000 mg | INTRAVENOUS | Status: DC | PRN
  Filled 2017-04-16: qty 2

## 2017-04-16 MED ORDER — OXYCODONE-ACETAMINOPHEN 5-325 MG PO TABS: 2.0000 | ORAL_TABLET | ORAL | Status: DC | PRN

## 2017-04-16 MED ORDER — DIBUCAINE 1 % RE OINT: 1.0000 "application " | TOPICAL_OINTMENT | RECTAL | Status: DC | PRN

## 2017-04-16 MED ORDER — OXYTOCIN 40 UNITS IN LACTATED RINGERS INFUSION - SIMPLE MED
2.5000 [IU]/h | INTRAVENOUS | Status: DC
  Administered 2017-04-16: 2.5 [IU]/h via INTRAVENOUS

## 2017-04-16 MED ORDER — LABETALOL HCL 200 MG PO TABS
200.0000 mg | ORAL_TABLET | Freq: Two times a day (BID) | ORAL | Status: DC
  Filled 2017-04-16: qty 1

## 2017-04-16 MED ORDER — ALBUTEROL SULFATE (2.5 MG/3ML) 0.083% IN NEBU: 3.0000 mL | INHALATION_SOLUTION | Freq: Four times a day (QID) | RESPIRATORY_TRACT | Status: DC | PRN

## 2017-04-16 MED ORDER — ONDANSETRON HCL 4 MG/2ML IJ SOLN: 4.0000 mg | INTRAMUSCULAR | Status: DC | PRN

## 2017-04-16 MED ORDER — PRENATAL MULTIVITAMIN CH
1.0000 | ORAL_TABLET | Freq: Every day | ORAL | Status: DC
  Filled 2017-04-16 (×2): qty 1

## 2017-04-16 MED ORDER — SENNOSIDES-DOCUSATE SODIUM 8.6-50 MG PO TABS
2.0000 | ORAL_TABLET | ORAL | Status: DC
  Administered 2017-04-18: 2 via ORAL
  Filled 2017-04-16 (×2): qty 2

## 2017-04-16 MED ORDER — PHENYLEPHRINE 40 MCG/ML (10ML) SYRINGE FOR IV PUSH (FOR BLOOD PRESSURE SUPPORT)
80.0000 ug | PREFILLED_SYRINGE | INTRAVENOUS | Status: DC | PRN
  Filled 2017-04-16: qty 5
  Filled 2017-04-16: qty 10

## 2017-04-16 MED ORDER — LIDOCAINE HCL (PF) 1 % IJ SOLN
30.0000 mL | INTRAMUSCULAR | Status: DC | PRN
  Filled 2017-04-16: qty 30

## 2017-04-16 MED ORDER — PHENYLEPHRINE 40 MCG/ML (10ML) SYRINGE FOR IV PUSH (FOR BLOOD PRESSURE SUPPORT)
80.0000 ug | PREFILLED_SYRINGE | INTRAVENOUS | Status: DC | PRN
  Filled 2017-04-16: qty 5

## 2017-04-16 MED ORDER — SOD CITRATE-CITRIC ACID 500-334 MG/5ML PO SOLN: 30.0000 mL | ORAL | Status: DC | PRN

## 2017-04-16 MED ORDER — LACTATED RINGERS IV SOLN: 500.0000 mL | INTRAVENOUS | Status: DC | PRN

## 2017-04-16 MED ORDER — PANTOPRAZOLE SODIUM 40 MG PO TBEC: 40.0000 mg | DELAYED_RELEASE_TABLET | Freq: Every day | ORAL | Status: DC

## 2017-04-16 MED ORDER — ONDANSETRON HCL 4 MG PO TABS
4.0000 mg | ORAL_TABLET | ORAL | Status: DC | PRN
Start: 2017-04-16 — End: 2017-04-18

## 2017-04-16 MED ORDER — OXYTOCIN 40 UNITS IN LACTATED RINGERS INFUSION - SIMPLE MED
1.0000 m[IU]/min | INTRAVENOUS | Status: DC
  Administered 2017-04-16: 2 m[IU]/min via INTRAVENOUS
  Filled 2017-04-16: qty 1000

## 2017-04-16 MED ORDER — DIPHENHYDRAMINE HCL 25 MG PO CAPS: 25.0000 mg | ORAL_CAPSULE | Freq: Four times a day (QID) | ORAL | Status: DC | PRN

## 2017-04-16 MED ORDER — COCONUT OIL OIL: 1.0000 "application " | TOPICAL_OIL | Status: DC | PRN

## 2017-04-16 MED ORDER — BENZOCAINE-MENTHOL 20-0.5 % EX AERO: 1.0000 "application " | INHALATION_SPRAY | CUTANEOUS | Status: DC | PRN

## 2017-04-16 MED ORDER — DIPHENHYDRAMINE HCL 50 MG/ML IJ SOLN: 12.5000 mg | INTRAMUSCULAR | Status: DC | PRN

## 2017-04-16 MED ORDER — OXYTOCIN BOLUS FROM INFUSION
500.0000 mL | Freq: Once | INTRAVENOUS | Status: AC
  Administered 2017-04-16: 500 mL via INTRAVENOUS

## 2017-04-16 MED ORDER — ZOLPIDEM TARTRATE 5 MG PO TABS: 5.0000 mg | ORAL_TABLET | Freq: Every evening | ORAL | Status: DC | PRN

## 2017-04-16 MED ORDER — OXYCODONE-ACETAMINOPHEN 5-325 MG PO TABS: 1.0000 | ORAL_TABLET | ORAL | Status: DC | PRN

## 2017-04-16 MED ORDER — ONDANSETRON HCL 4 MG/2ML IJ SOLN
4.0000 mg | Freq: Four times a day (QID) | INTRAMUSCULAR | Status: DC | PRN
Start: 2017-04-16 — End: 2017-04-16

## 2017-04-16 MED ORDER — ACETAMINOPHEN 325 MG PO TABS
650.0000 mg | ORAL_TABLET | ORAL | Status: DC | PRN
  Administered 2017-04-17: 650 mg via ORAL
  Filled 2017-04-16: qty 2

## 2017-04-16 NOTE — Anesthesia Pain Management Evaluation Note (Signed)
  CRNA Pain Management Visit Note  Patient: Michelle Houston, 36 y.o., female  "Hello I am a member of the anesthesia team at [facility]. We have an anesthesia team available at all times to provide care throughout the hospital, including epidural management and anesthesia for C-section. I don't know your plan for the delivery whether it a natural birth, water birth, IV sedation, nitrous supplementation, doula or epidural, but we want to meet your pain goals."   1.Was your pain managed to your expectations on prior hospitalizations?   Yes   2.What is your expectation for pain management during this hospitalization?     Epidural  3.How can we help you reach that goal? Epidural when ready.  Record the patient's initial score and the patient's pain goal.   Pain: 5  Pain Goal: 5 [facility] wants you to be able to say your pain was always managed very well.  Michelle Houston 04/16/2017

## 2017-04-16 NOTE — MAU Note (Addendum)
Contraction in office, not painful per pt.  Denies leaking or bleeding.  States sugars have started going up recently- is taking meds.  Was 1-2 when last checked.

## 2017-04-16 NOTE — Progress Notes (Signed)
LABOR PROGRESS NOTE  Michelle Houston is a 36 y.o. [phone] at 10364w6d  admitted for IOL for NR NST, Type 2 DM, and Chronic hypertension.  Subjective: Patient comfortable- not feeling contractions  Objective: BP 124/70   Pulse 80   Temp 98.1 F (36.7 C) (Oral)   Resp 18   Ht 5\' 1"  (1.549 m)   Wt 223 lb (101.2 kg)   LMP 07/20/2016 (Approximate)   SpO2 100%   BMI 42.14 kg/m  or  Vitals:   04/16/17 1026 04/16/17 1145 04/16/17 1155 04/16/17 1241  BP: 113/78 121/79  124/70  Pulse: 91 85  80  Resp: 18 18    Temp: 98.2 F (36.8 C) 98.1 F (36.7 C)    TempSrc: Oral Oral    SpO2: 100%     Weight:   223 lb (101.2 kg)   Height:   5\' 1"  (1.549 m)     Dilation: 1.5 Effacement (%): 60 Station: -2 Presentation: Vertex Exam by:: v Ranika Mcniel cnm FHT: baseline rate 150, moderate/minimal varibility, 10x10 acel, no decel Toco: irreg   Labs: Lab Results  Component Value Date   WBC 4.9 04/16/2017   HGB 13.4 04/16/2017   HCT 40.1 04/16/2017   MCV 88.7 04/16/2017   PLT 231 04/16/2017    Patient Active Problem List   Diagnosis Date Noted  . At risk for alteration in fetal status 04/16/2017  . Chronic hypertension during pregnancy, antepartum 11/14/2016  . Diabetes mellitus affecting pregnancy, antepartum 10/17/2016  . Low vitamin D level 09/30/2016  . Abnormal Pap smear of cervix 09/30/2016  . AMA (advanced maternal age) multigravida 35+ 09/30/2016  . DM (diabetes mellitus) (HCC) 09/19/2016  . S/P cholecystectomy 09/19/2016  . History of bladder repair surgery 09/19/2016  . H/O carpal tunnel repair 09/19/2016  . Obese 09/19/2016  . Supervision of high risk pregnancy, antepartum 10/13/2013  . Chronic hypertension 10/13/2013    Assessment / Plan: 36 y.o. G5P3013 at 4764w6d here for IOL for NR NST, Type 2 DM, and Chronic hypertension.  Labor: IOL: FB placed @ 1235, oral cytotec @ 1237 Fetal Wellbeing:  Cat 2 Pain Control:  None  Anticipated MOD:  SVD  Sharyon Cableogers, Merritt Kibby C, CNM,  04/16/2017, 12:43 PM

## 2017-04-16 NOTE — Progress Notes (Signed)
LABOR PROGRESS NOTE  Michelle Houston is a 36 y.o. [phone] at 1361w6d  admitted for IOL for NR NST, Type 2 DM, CHTN   Subjective: Patient comfortable with epidural- not currently feeling any pressure in her bottom   Objective: BP 129/68   Pulse 94   Temp 98.6 F (37 C) (Oral)   Resp 18   Ht 5\' 1"  (1.549 m)   Wt 223 lb (101.2 kg)   LMP 07/20/2016 (Approximate)   SpO2 100%   BMI 42.14 kg/m  or  Vitals:   04/16/17 1716 04/16/17 1721 04/16/17 1731 04/16/17 1800  BP: 125/61 127/70 131/69 129/68  Pulse: 81 93 91 94  Resp: 18 17 18    Temp:      TempSrc:      SpO2: 100% 100%    Weight:      Height:        Dilation: 5 Effacement (%): 70 Cervical Position: Middle Station: -2 Presentation: Vertex Exam by:: stone rnc FHT: baseline rate 150, moderate varibility, 10x10 acel present, no decel Toco: 3-5 by palpation   Labs: Lab Results  Component Value Date   WBC 4.9 04/16/2017   HGB 13.4 04/16/2017   HCT 40.1 04/16/2017   MCV 88.7 04/16/2017   PLT 231 04/16/2017    Patient Active Problem List   Diagnosis Date Noted  . At risk for alteration in fetal status 04/16/2017  . Chronic hypertension during pregnancy, antepartum 11/14/2016  . Diabetes mellitus affecting pregnancy, antepartum 10/17/2016  . Low vitamin D level 09/30/2016  . Abnormal Pap smear of cervix 09/30/2016  . AMA (advanced maternal age) multigravida 35+ 09/30/2016  . DM (diabetes mellitus) (HCC) 09/19/2016  . S/P cholecystectomy 09/19/2016  . History of bladder repair surgery 09/19/2016  . H/O carpal tunnel repair 09/19/2016  . Obese 09/19/2016  . Supervision of high risk pregnancy, antepartum 10/13/2013  . Chronic hypertension 10/13/2013    Assessment / Plan: 36 y.o. G5P3013 at 7461w6d here for IOL for NR NST, Type 2 DM, CHTN    Labor: Progressing well- Pitocin started at 1713 Fetal Wellbeing:  Cat 2 Pain Control:  Epidural Anticipated MOD:  SVD  Sharyon CableRogers, Katana Berthold C, CNM 04/16/2017, 6:21  PM

## 2017-04-16 NOTE — Anesthesia Procedure Notes (Signed)
Epidural Patient location during procedure: OB Start time: 04/16/2017 4:47 PM End time: 04/16/2017 4:51 PM  Staffing Anesthesiologist: Beryle LatheBrock, Thomas E, MD Performed: anesthesiologist   Preanesthetic Checklist Completed: patient identified, pre-op evaluation, timeout performed, IV checked, risks and benefits discussed and monitors and equipment checked  Epidural Patient position: sitting Prep: DuraPrep Patient monitoring: continuous pulse ox and blood pressure Approach: midline Location: L3-L4 Injection technique: LOR saline  Needle:  Needle type: Tuohy  Needle gauge: 17 G Needle length: 15 cm Needle insertion depth: 11 cm Catheter size: 19 Gauge Catheter at skin depth: 16 cm Test dose: negative and Other (1% lidocaine)  Additional Notes Patient identified. Risks including, but not limited to, bleeding, infection, nerve damage, paralysis, inadequate analgesia, blood pressure changes, nausea, vomiting, allergic reaction, postpartum back pain, itching, and headache were discussed. Patient expressed understanding and wished to proceed. Sterile prep and drape, including hand hygiene, mask, and sterile gloves were used. The patient was positioned and the spine was prepped. The skin was anesthetized with lidocaine. No paraesthesia or other complication noted. The patient did not experience any signs of intravascular injection such as tinnitus or metallic taste in mouth, nor signs of intrathecal spread such as rapid motor block. Please see nursing notes for vital signs. The patient tolerated the procedure well.   Leslye Peerhomas Brock, MDReason for block:procedure for pain

## 2017-04-16 NOTE — Progress Notes (Signed)
Patient reports eating a sandwich a little before 1900.

## 2017-04-16 NOTE — MAU Note (Signed)
Pt sent from office for further eval.  Pt has gest diab.  Had non-reactive NST with variables today.

## 2017-04-16 NOTE — Anesthesia Preprocedure Evaluation (Signed)
Anesthesia Evaluation  Patient identified by MRN, date of birth, ID band Patient awake    Reviewed: Allergy & Precautions, H&P , NPO status , Patient's Chart, lab work & pertinent test results  Airway Mallampati: III  TM Distance: >3 FB Neck ROM: full    Dental  (+) Dental Advisory Given   Pulmonary Current Smoker,    breath sounds clear to auscultation       Cardiovascular hypertension, Pt. on medications  Rhythm:regular Rate:Normal     Neuro/Psych negative neurological ROS  negative psych ROS   GI/Hepatic Neg liver ROS, GERD  ,  Endo/Other  diabetesMorbid obesity  Renal/GU negative Renal ROS  negative genitourinary   Musculoskeletal negative musculoskeletal ROS (+)   Abdominal   Peds  Hematology negative hematology ROS (+)   Anesthesia Other Findings   Reproductive/Obstetrics (+) Pregnancy PCOS                             Anesthesia Physical  Anesthesia Plan  ASA: III  Anesthesia Plan: Epidural   Post-op Pain Management:    Induction:   PONV Risk Score and Plan:   Airway Management Planned: Natural Airway  Additional Equipment:   Intra-op Plan:   Post-operative Plan:   Informed Consent: I have reviewed the patients History and Physical, chart, labs and discussed the procedure including the risks, benefits and alternatives for the proposed anesthesia with the patient or authorized representative who has indicated his/her understanding and acceptance.     Plan Discussed with:   Anesthesia Plan Comments: (Labs reviewed. Platelets acceptable, patient not taking any blood thinning medications. Risks and benefits discussed with patient, patient expressed understanding and wished to proceed.)        Anesthesia Quick Evaluation

## 2017-04-16 NOTE — H&P (Signed)
LABOR AND DELIVERY ADMISSION HISTORY AND PHYSICAL NOTE  Michelle Houston is a 36 y.o. female [phone] with IUP at [redacted]w[redacted]d by u/s presenting for NR NST with variables in the office on 11/27.  She reports positive fetal movement. She denies leakage of fluid or vaginal bleeding.  Prenatal History/Complications: PNC at [facility] Pregnancy complications: Type 2 DM and Chronic hypertension   Past Medical History: Past Medical History:  Diagnosis Date  . Bronchitis   . Diabetes mellitus    type 2 chronic  . Hypertension   . PCOS (polycystic ovarian syndrome)     Past Surgical History: Past Surgical History:  Procedure Laterality Date  . BLADDER REPAIR    . CARPAL TUNNEL RELEASE    . CHOLECYSTECTOMY      Obstetrical History: OB History    Gravida Para Term Preterm AB Living   5 3 3   1 3    SAB TAB Ectopic Multiple Live Births   1     0 3      Social History: Social History   Socioeconomic History  . Marital status: Legally Separated    Spouse name: None  . Number of children: None  . Years of education: None  . Highest education level: None  Social Needs  . Financial resource strain: None  . Food insecurity - worry: None  . Food insecurity - inability: None  . Transportation needs - medical: None  . Transportation needs - non-medical: None  Occupational History  . None  Tobacco Use  . Smoking status: Current Every Day Smoker    Packs/day: 0.50    Years: 18.00    Pack years: 9.00    Types: Cigarettes  . Smokeless tobacco: Never Used  . Tobacco comment: 6 day  Substance and Sexual Activity  . Alcohol use: No    Alcohol/week: 0.0 oz    Comment: rare   . Drug use: No  . Sexual activity: Yes    Partners: Male    Birth control/protection: None  Other Topics Concern  . None  Social History Narrative  . None    Family History: Family History  Problem Relation Age of Onset  . Cancer Maternal Grandmother   . Hypertension Mother   . Fibroids Mother   .  Diabetes Paternal Aunt   . Stroke Maternal Grandfather   . Diabetes Paternal Grandmother   . Stroke Paternal Grandmother   . Cancer Father        lung  . Heart disease Paternal Grandfather     Allergies: No Known Allergies  Medications Prior to Admission  Medication Sig Dispense Refill Last Dose  . acetaminophen (TYLENOL) 500 MG tablet Take 500 mg by mouth every 6 (six) hours as needed for moderate pain or headache.    Past Week at Unknown time  . glyBURIDE (DIABETA) 2.5 MG tablet Take 2 tablets (5 mg total) 2 (two) times daily with a meal by mouth. 60 tablet 3 04/16/2017 at Unknown time  . labetalol (NORMODYNE) 200 MG tablet Take 1 tablet (200 mg total) by mouth 2 (two) times daily. 60 tablet 3 04/16/2017 at 0800   . Prenat-FeAsp-Meth-FA-DHA w/o A (PRENATE PIXIE) 10-0.6-0.4-200 MG CAPS Take 1 tablet by mouth daily. 30 capsule 12 04/16/2017 at Unknown time  . ACCU-CHEK FASTCLIX LANCETS MISC 1 application by Does not apply route 4 (four) times daily -  with meals and at bedtime. 102 each 12 Taking  . albuterol (PROVENTIL HFA;VENTOLIN HFA) 108 (90 Base) MCG/ACT inhaler Inhale  2 puffs into the lungs every 6 (six) hours as needed for wheezing or shortness of breath.   emergency  . aspirin 81 MG chewable tablet Chew 1 tablet (81 mg total) by mouth daily. 30 tablet 12 04/02/2017  . glucose blood (ACCU-CHEK GUIDE) test strip Use as instructed 100 each 12 Taking  . Lancet Devices MISC 1 Device by Does not apply route 4 (four) times daily. 100 each 11 Taking  . omeprazole (PRILOSEC) 20 MG capsule Take 1 capsule (20 mg total) by mouth 2 (two) times daily before a meal. 60 capsule 5 04/13/2017  . ondansetron (ZOFRAN-ODT) 4 MG disintegrating tablet Take 1 tablet (4 mg total) by mouth every 8 (eight) hours as needed for nausea or vomiting. (Patient not taking: Reported on 04/09/2017) 20 tablet 0 Not Taking  . Vitamin D, Ergocalciferol, (DRISDOL) 50000 units CAPS capsule Take 1 capsule (50,000 Units  total) by mouth every 7 (seven) days. 30 capsule 2 04/14/2017     Review of Systems  All systems reviewed and negative except as stated in HPI  Physical Exam Blood pressure 113/78, pulse 91, temperature 98.2 F (36.8 C), temperature source Oral, resp. rate 18, last menstrual period 07/20/2016, SpO2 100 %. General appearance: alert, cooperative and no distress Lungs: clear to auscultation bilaterally Heart: regular rate and rhythm Abdomen: soft, non-tender; bowel sounds normal Extremities: No calf swelling or tenderness Presentation: cephalic Fetal monitoring: baseline 150/ moderate variability/ 10x10 accels present with no 15x15 accelerations/ no decels Uterine activity: irreg/mild/60-90    Prenatal labs: ABO, Rh: A/Positive/-- (05/02 1225) Antibody: Negative (05/02 1225) Rubella: 1.31 (05/02 1225) RPR: Non Reactive (09/25 1045)  HBsAg: Negative (05/02 1225)  HIV:   Non Reactive (09/25) GC/Chlamydia: Negative (11/13) GBS: Negative (11/13 1005)  1 hr Glucola: Early A1C Genetic screening:  Normal  Anatomy US: WNL   Clinic  [facility] Prenatal Labs  Dating  u/s Blood type: A/Positive/-- (05/02 1225)   Genetic Screen NT Scan: normal  AFP:  declines   NIPS:Panorama: normal female fetus Antibody:Negative (05/02 1225)  Anatomic US 12/05/17 WNL - needs growth in 4 wks Rubella: 1.31 (05/02 1225)  GTT Early A1C:6.2               Third trimester:  RPR: Non Reactive (09/25 1045)   Flu vaccine Declined HBsAg: Negative (05/02 1225)   TDaP vaccine  given 10/9                         Rhogam:n/a A+ HIV:   NR   Baby Food Bottle                              WUJ:WJXBJYNWGBS:Negative (11/13 1005)(For PCN allergy, check sensitivities)  Contraception IUD-mirena Pap:09/19/16: LGSIL negative HPV  Circumcision Girl  CF: negative  Pediatrician Undecided    Support Person FOB     Prenatal Transfer Tool  Maternal Diabetes: Yes:  Diabetes Type:  Insulin/Medication controlled Genetic Screening: Normal Maternal  Ultrasounds/Referrals: Normal Fetal Ultrasounds or other Referrals:  None Maternal Substance Abuse:  No Significant Maternal Medications:  Meds include: Other: Glyburide and Labetalol  Significant Maternal Lab Results: Lab values include: Group B Strep negative   Patient Active Problem List   Diagnosis Date Noted  . Chronic hypertension during pregnancy, antepartum 11/14/2016  . Diabetes mellitus affecting pregnancy, antepartum 10/17/2016  . Low vitamin D level 09/30/2016  . Abnormal Pap smear of cervix 09/30/2016  .  AMA (advanced maternal age) multigravida 35+ 09/30/2016  . DM (diabetes mellitus) (HCC) 09/19/2016  . S/P cholecystectomy 09/19/2016  . History of bladder repair surgery 09/19/2016  . H/O carpal tunnel repair 09/19/2016  . Obese 09/19/2016  . Supervision of high risk pregnancy, antepartum 10/13/2013  . Chronic hypertension 10/13/2013    Assessment: Wilder GladeCrystal G Nie is a 36 y.o. [phone] at 3154w6d here for IOL NR NST in office on 11/27, Type 2 DM on glyburide, Chronic Hypertension on labetalol   #Labor: IOL with FB and cytotec  #Pain: Plans epidural, but wants to wait  #FWB: Cat 2  #ID:  GBS neg  #MOF: Breast/Bottle  #MOC:IUD-mirena  #Circ:  N/A-girl   Sharyon Cableogers, Geffrey Michaelsen C, CNM  04/16/2017, 11:33 AM

## 2017-04-16 NOTE — Progress Notes (Signed)
Patient here for NST due to GDM and CHTN  NST reviewed and non reactive with baseline 150, absent/minimal variability, no accels, + decels  Patient sent to MAU for further evaluation and possible induction of labor

## 2017-04-16 NOTE — Progress Notes (Signed)
LABOR PROGRESS NOTE  Molli Ashok CordiaG Mago is a 36 y.o. [id number] at 4085w6d  admitted for IOL for NR NST. Pregnancy also c/b T2DM and CHTN  Subjective: Pt doing well, comfortable with epidural  Objective: BP 123/62   Pulse 81   Temp 98.4 F (36.9 C) (Oral)   Resp 16   Ht 5\' 1"  (1.549 m)   Wt 223 lb (101.2 kg)   LMP 07/20/2016 (Approximate)   SpO2 100%   BMI 42.14 kg/m  or  Vitals:   04/16/17 1900 04/16/17 1932 04/16/17 2006 04/16/17 2033  BP: 116/66 114/77 123/62   Pulse: 88 94 81   Resp: 18 16 16    Temp:  97.7 F (36.5 C)  98.4 F (36.9 C)  TempSrc:  Oral  Oral  SpO2:      Weight:      Height:        SVE: Dilation: 6.5 Effacement (%): 80 Cervical Position: Middle Station: -2 Presentation: Vertex Exam by:: (Estellar Cadena, MD) FHT: baseline rate 150, moderate varibility, 10x10 acel, occasional variable decel Toco: q ~3 min (not tracing well)  CBG (last 3)  Recent Labs    04/16/17 1217 04/16/17 1610 04/16/17 2005  GLUCAP 67 94 122*     Assessment / Plan: 36 y.o. [id number] at 3285w6d here for IOL for NR HST. Pregnancy also complicated by T2DM and CHTN  Labor: AROM and IUPC placed. Continue to titrate Pitocin Fetal Wellbeing:  Cat II Pain Control:  Epidural Anticipated MOD:  SVD CHTN: BPs have been wnl DM: CBGs have been at goal, but last was 122 (pt drank juice). Will monitor more closely. If CBG > 120 with next check, will start IV insulin  Frederik PearJulie P Xaniyah Buchholz, MD 04/16/2017, 8:44 PM

## 2017-04-17 ENCOUNTER — Inpatient Hospital Stay (HOSPITAL_COMMUNITY)
Admission: RE | Admit: 2017-04-17 | Discharge: 2017-04-17 | Disposition: A | Discharge status: Home / Self Care | Payer: Medicaid Other | Source: Ambulatory Visit | Attending: Family Medicine | Admitting: Family Medicine

## 2017-04-17 ENCOUNTER — Encounter: Payer: Self-pay | Admitting: *Deleted

## 2017-04-17 LAB — GLUCOSE, CAPILLARY
GLUCOSE-CAPILLARY: 109 mg/dL — AB (ref 65–99)
GLUCOSE-CAPILLARY: 125 mg/dL — AB (ref 65–99)
Glucose-Capillary: 139 mg/dL — ABNORMAL HIGH (ref 65–99)

## 2017-04-17 LAB — RPR: RPR: NONREACTIVE

## 2017-04-17 MED ORDER — LABETALOL HCL 200 MG PO TABS
200.0000 mg | ORAL_TABLET | Freq: Two times a day (BID) | ORAL | Status: DC
  Administered 2017-04-17: 200 mg via ORAL
  Filled 2017-04-17: qty 1

## 2017-04-17 MED ORDER — AMLODIPINE BESYLATE 10 MG PO TABS
10.0000 mg | ORAL_TABLET | Freq: Every day | ORAL | Status: DC
  Administered 2017-04-17 – 2017-04-18 (×2): 10 mg via ORAL
  Filled 2017-04-17 (×3): qty 1

## 2017-04-17 MED ORDER — METFORMIN HCL 500 MG PO TABS
500.0000 mg | ORAL_TABLET | Freq: Every day | ORAL | Status: DC
  Administered 2017-04-17 – 2017-04-18 (×2): 500 mg via ORAL
  Filled 2017-04-17 (×3): qty 1

## 2017-04-17 NOTE — Progress Notes (Signed)
POSTPARTUM PROGRESS NOTE  Post Partum Day #1 Subjective:  Michelle Houston is a 36 y.o. [id number] 3563w6d s/p SVD.  No acute events overnight.  Pt denies problems with ambulating, voiding or po intake.  She denies nausea or vomiting.  Pain is well controlled.  She has had flatus. She has not had bowel movement.  Lochia Minimal.   Objective: Blood pressure (!) 116/56, pulse 86, temperature 98.3 F (36.8 C), temperature source Oral, resp. rate 16, height 5\' 1"  (1.549 m), weight 101.2 kg (223 lb), last menstrual period 07/20/2016, SpO2 100 %, unknown if currently breastfeeding.  Physical Exam:  General: alert, cooperative and no distress Lochia:normal flow Chest: no respiratory distress Heart:regular rate, distal pulses intact Abdomen: soft, nontender,  Uterine Fundus: firm, appropriately tender DVT Evaluation: No calf swelling or tenderness Extremities: mild edema  Recent Labs    04/16/17 1145  HGB 13.4  HCT 40.1    Assessment/Plan:  ASSESSMENT: Michelle Houston is a 36 y.o. [id number] 6563w6d s/p SVD. Last BG 95. Patient restarted on labetalol for mild range BP. Will restart metformin 500 once daily and titrate. Will discontinue labetalol and start patient on amlodipine 10 mg. She was previously on ARBs.  Plan for discharge tomorrow   LOS: 1 day   Lovena NeighboursAbdoulaye Diallo, MD 04/17/2017, 6:00 AM   OB FELLOW POSTPARTUM PROGRESS NOTE ATTESTATION I have seen and examined this patient and agree with above documentation in the resident's note.   PPD#1, doing well T2DM: will restart metformin. Start at 500mg  daily, and titrate outpatient up to 1,000 mg BID. Consider prescribing metformin XR on discharge given previous GI side effect CHTN: previously on Losartan, and she likely would benefit from this for renal protection given T2DM, but counseled being on a LARC first  Frederik PearJulie P Degele, MD OB Fellow 11:46 PM

## 2017-04-17 NOTE — Progress Notes (Signed)
Patient forgot to call for ac lunch CBG. RN reminded patient that she needs to call before meals when her tray gets to the room. Patient will call for CBG when dinner tray gets to room.

## 2017-04-17 NOTE — Lactation Note (Signed)
This note was copied from a baby's chart. Lactation Consultation Note  Patient Name: Michelle Houston Today's Date: 04/17/2017   Baby 12 hours old. Mom giving baby a bottle of formula when this LC entered the room. Mom reports that she intends to formula feed baby by bottle, though she may put baby to breast occasionally for benefit of colostrum. [facility], and aware she can call if she needs assistance.   Maternal Data    Feeding Feeding Type: Formula Nipple Type: Slow - flow  LATCH Score                   Interventions    Lactation Tools Discussed/Used     Consult Status      Sherlyn HayJennifer D Makaiya Geerdes 04/17/2017, 10:59 AM

## 2017-04-17 NOTE — Anesthesia Postprocedure Evaluation (Signed)
Anesthesia Post Note  Patient: Michelle Houston  Procedure(s) Performed: AN AD HOC LABOR EPIDURAL     Patient location during evaluation: Mother Baby Anesthesia Type: Epidural Level of consciousness: awake Pain management: pain level controlled Vital Signs Assessment: post-procedure vital signs reviewed and stable Respiratory status: spontaneous breathing Cardiovascular status: stable Postop Assessment: no headache, no backache, epidural receding, patient able to bend at knees, no apparent nausea or vomiting and adequate PO intake Anesthetic complications: no    Last Vitals:  Vitals:   04/17/17 0105 04/17/17 0500  BP: (!) 141/75 (!) 116/56  Pulse: 98 86  Resp: 18 16  Temp: 37.1 C 36.8 C  SpO2:      Last Pain:  Vitals:   04/17/17 0725  TempSrc:   PainSc: 3    Pain Goal: Patients Stated Pain Goal: 1 (04/17/17 0725)               Fanny DanceMULLINS,Emunah Texidor

## 2017-04-18 LAB — GLUCOSE, CAPILLARY: GLUCOSE-CAPILLARY: 123 mg/dL — AB (ref 65–99)

## 2017-04-18 MED ORDER — METFORMIN HCL 500 MG PO TABS: 500.0000 mg | ORAL_TABLET | Freq: Two times a day (BID) | ORAL | 3 refills | Status: DC

## 2017-04-18 MED ORDER — AMLODIPINE BESYLATE 10 MG PO TABS: 10.0000 mg | ORAL_TABLET | Freq: Every day | ORAL | 0 refills | Status: DC

## 2017-04-18 MED ORDER — IBUPROFEN 600 MG PO TABS: 600.0000 mg | ORAL_TABLET | Freq: Four times a day (QID) | ORAL | 0 refills | Status: DC

## 2017-04-18 NOTE — Discharge Summary (Signed)
OB Discharge Summary     Patient Name: Michelle Houston DOB: 1980-05-26 MRN: [id number]  Date of admission: 04/16/2017 Delivering MD: Frederik PearEGELE, Rhylynn Perdomo P   Date of discharge: 04/18/2017  Admitting diagnosis: 38WKS, BABY HEART RATE DROPPED,CTX Intrauterine pregnancy: 251w6d     Secondary diagnosis:  Principal Problem:   SVD (spontaneous vaginal delivery) Active Problems:   Chronic hypertension   DM (diabetes mellitus) (HCC)   At risk for alteration in fetal status      Discharge diagnosis: Term Pregnancy Delivered                                                                                                Post partum procedures:none  Augmentation: AROM, Pitocin, Cytotec and Foley Balloon  Complications: None  Hospital course:  Induction of Labor With Vaginal Delivery   36 y.o. yo [id number] at 4051w6d was admitted to the hospital 04/16/2017 for induction of labor.  Indication for induction: nonreasuring NST. She was induced with FB, cytotec, Pitocin, and AROM. Blood sugars were monitoring in labor due to T2DM. Patient had an uncomplicated labor course as follows: Membrane Rupture Time/Date: 8:33 PM ,04/16/2017   Intrapartum Procedures: Episiotomy: None [1]                                         Lacerations:  None [1]  Patient had delivery of a Viable infant.  Information for the patient's newborn:  Cristy FriedlanderJohnson, Girl Enis [[id number]][[id number]]  Delivery Method: Vaginal, Spontaneous(Filed from Delivery Summary)   04/16/2017  Details of delivery can be found in separate delivery note.  Patient had an uncomplicated postpartum course.  For her chronic hypertension, she was on losartan prior to pregnancy, and on labetalol during pregnancy. Pt would like to resume losartan, and would likely benefit from this for renal protection given T2DM. However, counseled on need for LARC, and we will hold of on restarting ARB until after she get an IUD. She was discharged home on amlodipine 10 mg in the  meantime. She will have a 2-week PPV for BP check. For her T2DM, she was metformin prior to pregnancy, and on glyburide during pregnancy. Metformin 500 mg BID, with goal to titrate later to 1,000 mg BID.  Patient is discharged home 04/18/17 in stable condition.  Physical exam  Vitals:   04/17/17 0500 04/17/17 1058 04/17/17 1839 04/18/17 0542  BP: (!) 116/56 133/70 130/76 121/79  Pulse: 86 84 80 77  Resp: 16  16 18   Temp: 98.3 F (36.8 C)  98.4 F (36.9 C) (!) 97.5 F (36.4 C)  TempSrc: Oral  Oral Oral  SpO2:      Weight:      Height:       General: alert, cooperative and no distress Lochia: appropriate Uterine Fundus: firm Incision: N/A DVT Evaluation: No evidence of DVT seen on physical exam. No cords or calf tenderness. No significant calf/ankle edema. Labs: Lab Results  Component Value Date   WBC 4.9 04/16/2017   HGB 13.4  04/16/2017   HCT 40.1 04/16/2017   MCV 88.7 04/16/2017   PLT 231 04/16/2017   CMP Latest Ref Rng & Units 03/13/2017  Glucose 65 - 99 mg/dL 409(W149(H)  BUN 6 - 20 mg/dL 8  Creatinine 1.190.44 - 1.471.00 mg/dL 8.290.69  Sodium 562135 - 130145 mmol/L 132(L)  Potassium 3.5 - 5.1 mmol/L 3.5  Chloride 101 - 111 mmol/L 103  CO2 22 - 32 mmol/L 20(L)  Calcium 8.9 - 10.3 mg/dL 8.6(V8.5(L)  Total Protein 6.5 - 8.1 g/dL 7.0  Total Bilirubin 0.3 - 1.2 mg/dL 0.3  Alkaline Phos 38 - 126 U/L 134(H)  AST 15 - 41 U/L 28  ALT 14 - 54 U/L 30    Discharge instruction: per After Visit Summary and "Baby and Me Booklet".  After visit meds:  Allergies as of 04/18/2017   No Known Allergies     Medication List    STOP taking these medications   glyBURIDE 2.5 MG tablet Commonly known as:  DIABETA   labetalol 200 MG tablet Commonly known as:  NORMODYNE     TAKE these medications   ACCU-CHEK FASTCLIX LANCETS Misc 1 application by Does not apply route 4 (four) times daily -  with meals and at bedtime.   acetaminophen 500 MG tablet Commonly known as:  TYLENOL Take 500 mg by mouth  every 6 (six) hours as needed for moderate pain or headache.   albuterol 108 (90 Base) MCG/ACT inhaler Commonly known as:  PROVENTIL HFA;VENTOLIN HFA Inhale 2 puffs into the lungs every 6 (six) hours as needed for wheezing or shortness of breath.   amLODipine 10 MG tablet Commonly known as:  NORVASC Take 1 tablet (10 mg total) by mouth daily.   aspirin 81 MG chewable tablet Chew 1 tablet (81 mg total) by mouth daily.   glucose blood test strip Commonly known as:  ACCU-CHEK GUIDE Use as instructed   ibuprofen 600 MG tablet Commonly known as:  ADVIL,MOTRIN Take 1 tablet (600 mg total) by mouth every 6 (six) hours.   Lancet Devices Misc 1 Device by Does not apply route 4 (four) times daily.   metFORMIN 500 MG tablet Commonly known as:  GLUCOPHAGE Take 1 tablet (500 mg total) by mouth 2 (two) times daily with a meal.   omeprazole 20 MG capsule Commonly known as:  PRILOSEC Take 1 capsule (20 mg total) by mouth 2 (two) times daily before a meal.   ondansetron 4 MG disintegrating tablet Commonly known as:  ZOFRAN-ODT Take 1 tablet (4 mg total) by mouth every 8 (eight) hours as needed for nausea or vomiting.   PRENATE PIXIE 10-0.6-0.4-200 MG Caps Take 1 tablet by mouth daily.   Vitamin D (Ergocalciferol) 50000 units Caps capsule Commonly known as:  DRISDOL Take 1 capsule (50,000 Units total) by mouth every 7 (seven) days.       Diet: low salt diet  Activity: Advance as tolerated. Pelvic rest for 6 weeks.   Outpatient follow up: 2 week BP check; 6 week PPV  Postpartum contraception: IUD Mirena  Newborn Data: Live born female  Birth Weight: 6 lb 9 oz (2977 g) APGAR: 9, 9  Newborn Delivery   Birth date/time:  04/16/2017 22:23:00 Delivery type:  Vaginal, Spontaneous     Baby Feeding: Bottle and Breast Disposition:home with mother   04/18/2017 Frederik PearJulie P Glendene Wyer, MD

## 2017-04-18 NOTE — Discharge Instructions (Signed)
Postpartum Care After Vaginal Delivery °The period of time right after you deliver your newborn is called the postpartum period. °What kind of medical care will I receive? °· You may continue to receive fluids and medicines through an IV tube inserted into one of your veins. °· If an incision was made near your vagina (episiotomy) or if you had some vaginal tearing during delivery, cold compresses may be placed on your episiotomy or your tear. This helps to reduce pain and swelling. °· You may be given a squirt bottle to use when you go to the bathroom. You may use this until you are comfortable wiping as usual. To use the squirt bottle, follow these steps: °? Before you urinate, fill the squirt bottle with warm water. Do not use hot water. °? After you urinate, while you are sitting on the toilet, use the squirt bottle to rinse the area around your urethra and vaginal opening. This rinses away any urine and blood. °? You may do this instead of wiping. As you start healing, you may use the squirt bottle before wiping yourself. Make sure to wipe gently. °? Fill the squirt bottle with clean water every time you use the bathroom. °· You will be given sanitary pads to wear. °How can I expect to feel? °· You may not feel the need to urinate for several hours after delivery. °· You will have some soreness and pain in your abdomen and vagina. °· If you are breastfeeding, you may have uterine contractions every time you breastfeed for up to several weeks postpartum. Uterine contractions help your uterus return to its normal size. °· It is normal to have vaginal bleeding (lochia) after delivery. The amount and appearance of lochia is often similar to a menstrual period in the first week after delivery. It will gradually decrease over the next few weeks to a dry, yellow-brown discharge. For most women, lochia stops completely by 6-8 weeks after delivery. Vaginal bleeding can vary from woman to woman. °· Within the first few  days after delivery, you may have breast engorgement. This is when your breasts feel heavy, full, and uncomfortable. Your breasts may also throb and feel hard, tightly stretched, warm, and tender. After this occurs, you may have milk leaking from your breasts. Your health care provider can help you relieve discomfort due to breast engorgement. Breast engorgement should go away within a few days. °· You may feel more sad or worried than normal due to hormonal changes after delivery. These feelings should not last more than a few days. If these feelings do not go away after several days, speak with your health care provider. °How should I care for myself? °· Tell your health care provider if you have pain or discomfort. °· Drink enough water to keep your urine clear or pale yellow. °· Wash your hands thoroughly with soap and water for at least 20 seconds after changing your sanitary pads, after using the toilet, and before holding or feeding your baby. °· If you are not breastfeeding, avoid touching your breasts a lot. Doing this can make your breasts produce more milk. °· If you become weak or lightheaded, or you feel like you might faint, ask for help before: °? Getting out of bed. °? Showering. °· Change your sanitary pads frequently. Watch for any changes in your flow, such as a sudden increase in volume, a change in color, the passing of large blood clots. If you pass a blood clot from your vagina, save it   to show to your health care provider. Do not flush blood clots down the toilet without having your health care provider look at them. °· Make sure that all your vaccinations are up to date. This can help protect you and your baby from getting certain diseases. You may need to have immunizations done before you leave the hospital. °· If desired, talk with your health care provider about methods of family planning or birth control (contraception). °How can I start bonding with my baby? °Spending as much time as  possible with your baby is very important. During this time, you and your baby can get to know each other and develop a bond. Having your baby stay with you in your room (rooming in) can give you time to get to know your baby. Rooming in can also help you become comfortable caring for your baby. Breastfeeding can also help you bond with your baby. °How can I plan for returning home with my baby? °· Make sure that you have a car seat installed in your vehicle. °? Your car seat should be checked by a certified car seat installer to make sure that it is installed safely. °? Make sure that your baby fits into the car seat safely. °· Ask your health care provider any questions you have about caring for yourself or your baby. Make sure that you are able to contact your health care provider with any questions after leaving the hospital. °This information is not intended to replace advice given to you by your health care provider. Make sure you discuss any questions you have with your health care provider. °Document Released: 03/04/2007 Document Revised: 10/10/2015 Document Reviewed: 04/11/2015 °Elsevier Interactive Patient Education © 2018 [employer] ° °

## 2017-04-19 ENCOUNTER — Telehealth: Payer: Self-pay | Admitting: *Deleted

## 2017-04-26 NOTE — Telephone Encounter (Signed)
error 

## 2017-05-29 ENCOUNTER — Ambulatory Visit: Payer: Medicaid Other | Admitting: Obstetrics and Gynecology

## 2017-05-29 ENCOUNTER — Other Ambulatory Visit: Payer: Medicaid Other

## 2017-06-24 ENCOUNTER — Ambulatory Visit: Payer: Medicaid Other | Admitting: Obstetrics and Gynecology

## 2017-08-19 ENCOUNTER — Ambulatory Visit: Payer: Medicaid Other | Admitting: Obstetrics and Gynecology

## 2017-08-23 ENCOUNTER — Encounter (HOSPITAL_BASED_OUTPATIENT_CLINIC_OR_DEPARTMENT_OTHER): Payer: Self-pay | Admitting: Emergency Medicine

## 2017-08-23 ENCOUNTER — Emergency Department (HOSPITAL_BASED_OUTPATIENT_CLINIC_OR_DEPARTMENT_OTHER): Payer: Medicaid Other

## 2017-08-23 ENCOUNTER — Emergency Department (HOSPITAL_BASED_OUTPATIENT_CLINIC_OR_DEPARTMENT_OTHER)
Admission: EM | Admit: 2017-08-23 | Discharge: 2017-08-23 | Disposition: A | Discharge status: Home / Self Care | Payer: Medicaid Other | Attending: Emergency Medicine | Admitting: Emergency Medicine

## 2017-08-23 ENCOUNTER — Other Ambulatory Visit: Payer: Self-pay

## 2017-08-23 DIAGNOSIS — R6889 Other general symptoms and signs: Secondary | ICD-10-CM

## 2017-08-23 DIAGNOSIS — Z7982 Long term (current) use of aspirin: Secondary | ICD-10-CM | POA: Insufficient documentation

## 2017-08-23 DIAGNOSIS — E119 Type 2 diabetes mellitus without complications: Secondary | ICD-10-CM | POA: Diagnosis not present

## 2017-08-23 DIAGNOSIS — F17210 Nicotine dependence, cigarettes, uncomplicated: Secondary | ICD-10-CM | POA: Insufficient documentation

## 2017-08-23 DIAGNOSIS — Z9049 Acquired absence of other specified parts of digestive tract: Secondary | ICD-10-CM | POA: Insufficient documentation

## 2017-08-23 DIAGNOSIS — J111 Influenza due to unidentified influenza virus with other respiratory manifestations: Secondary | ICD-10-CM | POA: Insufficient documentation

## 2017-08-23 DIAGNOSIS — I10 Essential (primary) hypertension: Secondary | ICD-10-CM | POA: Diagnosis not present

## 2017-08-23 DIAGNOSIS — Z7984 Long term (current) use of oral hypoglycemic drugs: Secondary | ICD-10-CM | POA: Diagnosis not present

## 2017-08-23 DIAGNOSIS — R05 Cough: Secondary | ICD-10-CM | POA: Diagnosis present

## 2017-08-23 DIAGNOSIS — Z79899 Other long term (current) drug therapy: Secondary | ICD-10-CM | POA: Insufficient documentation

## 2017-08-23 HISTORY — DX: Pneumonia, unspecified organism: J18.9

## 2017-08-23 HISTORY — DX: Obesity, unspecified: E66.9

## 2017-08-23 MED ORDER — OSELTAMIVIR PHOSPHATE 75 MG PO CAPS: 75.0000 mg | ORAL_CAPSULE | Freq: Two times a day (BID) | ORAL | 0 refills | Status: DC

## 2017-08-23 MED FILL — TAMIFLU 75 MG GELCAP: 75 | 5 days supply | Qty: 10 | Fill #0

## 2017-08-23 NOTE — ED Provider Notes (Signed)
[facility] EMERGENCY DEPARTMENT Provider Note   CSN: [id number] Arrival date & time: 08/23/17  0844     History   Chief Complaint Chief Complaint  Patient presents with  . URI symptoms    HPI Michelle Houston is a 37 y.o. female.  Patient with onset of cough congestion body aches chills on Wednesday evening.  No nausea vomiting or diarrhea.  Did not have the flu shot.  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  Patient no longer has diabetes.      Past Medical History:  Diagnosis Date  . Bronchitis   . Diabetes mellitus    type 2 chronic  . Hypertension   . Obesity   . PCOS (polycystic ovarian syndrome)   . Pneumonia     Patient Active Problem List   Diagnosis Date Noted  . SVD (spontaneous vaginal delivery) 04/18/2017  . At risk for alteration in fetal status 04/16/2017  . Chronic hypertension during pregnancy, antepartum 11/14/2016  . Diabetes mellitus affecting pregnancy, antepartum 10/17/2016  . Low vitamin D level 09/30/2016  . Abnormal Pap smear of cervix 09/30/2016  . AMA (advanced maternal age) multigravida 35+ 09/30/2016  . DM (diabetes mellitus) (HCC) 09/19/2016  . S/P cholecystectomy 09/19/2016  . History of bladder repair surgery 09/19/2016  . H/O carpal tunnel repair 09/19/2016  . Obese 09/19/2016  . Supervision of high risk pregnancy, antepartum 10/13/2013  . Chronic hypertension 10/13/2013    Past Surgical History:  Procedure Laterality Date  . BLADDER REPAIR    . CARPAL TUNNEL RELEASE    . CHOLECYSTECTOMY       OB History    Gravida  5   Para  4   Term  4   Preterm      AB  1   Living  4     SAB  1   TAB      Ectopic      Multiple  0   Live Births  4            Home Medications    Prior to Admission medications   Medication Sig Start Date End Date Taking? Authorizing Provider  ACCU-CHEK FASTCLIX LANCETS MISC 1 application by Does not apply route 4 (four) times daily -  with  meals and at bedtime. 01/09/17   Orvilla Cornwall A, CNM  acetaminophen (TYLENOL) 500 MG tablet Take 500 mg by mouth every 6 (six) hours as needed for moderate pain or headache.     [provider]  albuterol (PROVENTIL HFA;VENTOLIN HFA) 108 (90 Base) MCG/ACT inhaler Inhale 2 puffs into the lungs every 6 (six) hours as needed for wheezing or shortness of breath.    [provider]  amLODipine (NORVASC) 10 MG tablet Take 1 tablet (10 mg total) by mouth daily. 04/18/17   Degele, Kandra Nicolas, MD  aspirin 81 MG chewable tablet Chew 1 tablet (81 mg total) by mouth daily. 12/12/16   Hermina Staggers, MD  glucose blood (ACCU-CHEK GUIDE) test strip Use as instructed 01/09/17   Orvilla Cornwall A, CNM  ibuprofen (ADVIL,MOTRIN) 600 MG tablet Take 1 tablet (600 mg total) by mouth every 6 (six) hours. 04/18/17   Degele, Kandra Nicolas, MD  Lancet Devices MISC 1 Device by Does not apply route 4 (four) times daily. 11/14/16   Hermina Staggers, MD  metFORMIN (GLUCOPHAGE) 500 MG tablet Take 1 tablet (500 mg total) by mouth 2 (two) times daily with  a meal. 04/18/17   Degele, Kandra NicolasJulie P, MD  omeprazole (PRILOSEC) 20 MG capsule Take 1 capsule (20 mg total) by mouth 2 (two) times daily before a meal. 02/06/17   Brock BadHarper, Charles A, MD  ondansetron (ZOFRAN-ODT) 4 MG disintegrating tablet Take 1 tablet (4 mg total) by mouth every 8 (eight) hours as needed for nausea or vomiting. Patient not taking: Reported on 04/09/2017 03/13/17   Pincus LargePhelps, Jazma Y, DO  oseltamivir (TAMIFLU) 75 MG capsule Take 1 capsule (75 mg total) by mouth every 12 (twelve) hours. 08/23/17   Vanetta MuldersZackowski, Laray Corbit, MD  Prenat-FeAsp-Meth-FA-DHA w/o A (PRENATE PIXIE) 10-0.6-0.4-200 MG CAPS Take 1 tablet by mouth daily. 09/19/16   Orvilla Cornwallenney, Rachelle A, CNM  Vitamin D, Ergocalciferol, (DRISDOL) 50000 units CAPS capsule Take 1 capsule (50,000 Units total) by mouth every 7 (seven) days. 09/30/16   Roe Coombsenney, Rachelle A, CNM    Family History Family History  Problem Relation  Age of Onset  . Cancer Maternal Grandmother   . Hypertension Mother   . Fibroids Mother   . Diabetes Paternal Aunt   . Stroke Maternal Grandfather   . Diabetes Paternal Grandmother   . Stroke Paternal Grandmother   . Cancer Father        lung  . Heart disease Paternal Grandfather     Social History Social History   Tobacco Use  . Smoking status: Current Every Day Smoker    Packs/day: 1.00    Years: 18.00    Pack years: 18.00    Types: Cigarettes  . Smokeless tobacco: Never Used  . Tobacco comment: 6 day  Substance Use Topics  . Alcohol use: No    Alcohol/week: 0.0 oz    Comment: rare   . Drug use: No     Allergies   Patient has no known allergies.   Review of Systems Review of Systems  Constitutional: Positive for chills.  HENT: Positive for congestion. Negative for sore throat.   Eyes: Negative for redness.  Respiratory: Positive for cough.   Cardiovascular: Negative for chest pain.  Gastrointestinal: Negative for abdominal pain, diarrhea, nausea and vomiting.  Genitourinary: Negative for dysuria.  Musculoskeletal: Positive for myalgias.  Skin: Negative for rash.  Neurological: Negative for syncope and headaches.  Hematological: Does not bruise/bleed easily.  Psychiatric/Behavioral: Negative for confusion.     Physical Exam Updated Vital Signs BP 115/74 (BP Location: Right Arm)   Pulse (!) 106   Temp 98.8 F (37.1 C) (Oral)   Resp 16   Ht 1.549 m (5\' 1" )   Wt 110.7 kg (244 lb)   LMP 08/13/2017   SpO2 99%   BMI 46.10 kg/m   Physical Exam  Constitutional: She is oriented to person, place, and time. She appears well-developed and well-nourished. No distress.  HENT:  Head: Normocephalic and atraumatic.  Mouth/Throat: Oropharynx is clear and moist.  Eyes: Pupils are equal, round, and reactive to light. Conjunctivae and EOM are normal.  Neck: Normal range of motion. Neck supple.  Cardiovascular: Normal rate, regular rhythm and normal heart sounds.    Pulmonary/Chest: Effort normal and breath sounds normal. No respiratory distress. She has no wheezes.  Abdominal: Soft. Bowel sounds are normal. There is no tenderness.  Musculoskeletal: Normal range of motion. She exhibits no edema.  Neurological: She is alert and oriented to person, place, and time. No cranial nerve deficit or sensory deficit. She exhibits normal muscle tone. Coordination normal.  Skin: Skin is warm. No rash noted.  Nursing note and vitals reviewed.  ED Treatments / Results  Labs (all labs ordered are listed, but only abnormal results are displayed) Labs Reviewed - No data to display  EKG None  Radiology Dg Chest 2 View  Result Date: 08/23/2017 CLINICAL DATA:  Flu-like symptoms.  Productive cough, smoker EXAM: CHEST - 2 VIEW COMPARISON:  05/02/2012 FINDINGS: Heart and mediastinal contours are within normal limits. No focal opacities or effusions. No acute bony abnormality. IMPRESSION: No active cardiopulmonary disease. Electronically Signed   By: Charlett Nose M.D.   On: 08/23/2017 09:43    Procedures Procedures (including critical care time)  Medications Ordered in ED Medications - No data to display   Initial Impression / Assessment and Plan / ED Course  I have reviewed the triage vital signs and the nursing notes.  Pertinent labs & imaging results that were available during my care of the patient were reviewed by me and considered in my medical decision making (see chart for details).     Symptoms very flulike in nature.  Patient within 48-hour window will treat with Tamiflu.  Chest x-ray negative for pneumonia.  Patient nontoxic no acute distress.  Final Clinical Impressions(s) / ED Diagnoses   Final diagnoses:  Flu-like symptoms    ED Discharge Orders        Ordered    oseltamivir (TAMIFLU) 75 MG capsule  Every 12 hours     08/23/17 1018       Vanetta Mulders, MD 08/23/17 1025

## 2017-08-23 NOTE — ED Notes (Signed)
ED Provider at bedside discussing test results and dispo plan of care. 

## 2017-08-23 NOTE — ED Triage Notes (Signed)
Cough, sore throat and body aches x3 days. Denies n/v/d.

## 2017-08-23 NOTE — Discharge Instructions (Addendum)
Work note provided.  Chest x-ray negative for pneumonia.  Take the Tamiflu as directed continue your over-the-counter cold and flu meds.  Return for any new or worse symptoms.

## 2017-08-23 NOTE — ED Provider Notes (Deleted)
[facility] EMERGENCY DEPARTMENT Provider Note   CSN: [id number] Arrival date & time: 08/23/17  0844     History   Chief Complaint Chief Complaint  Patient presents with  . URI symptoms    HPI Michelle Houston is a 37 y.o. female.  HPI  Past Medical History:  Diagnosis Date  . Bronchitis   . Diabetes mellitus    type 2 chronic  . Hypertension   . Obesity   . PCOS (polycystic ovarian syndrome)   . Pneumonia     Patient Active Problem List   Diagnosis Date Noted  . SVD (spontaneous vaginal delivery) 04/18/2017  . At risk for alteration in fetal status 04/16/2017  . Chronic hypertension during pregnancy, antepartum 11/14/2016  . Diabetes mellitus affecting pregnancy, antepartum 10/17/2016  . Low vitamin D level 09/30/2016  . Abnormal Pap smear of cervix 09/30/2016  . AMA (advanced maternal age) multigravida 35+ 09/30/2016  . DM (diabetes mellitus) (HCC) 09/19/2016  . S/P cholecystectomy 09/19/2016  . History of bladder repair surgery 09/19/2016  . H/O carpal tunnel repair 09/19/2016  . Obese 09/19/2016  . Supervision of high risk pregnancy, antepartum 10/13/2013  . Chronic hypertension 10/13/2013    Past Surgical History:  Procedure Laterality Date  . BLADDER REPAIR    . CARPAL TUNNEL RELEASE    . CHOLECYSTECTOMY       OB History    Gravida  5   Para  4   Term  4   Preterm      AB  1   Living  4     SAB  1   TAB      Ectopic      Multiple  0   Live Births  4            Home Medications    Prior to Admission medications   Medication Sig Start Date End Date Taking? Authorizing Provider  ACCU-CHEK FASTCLIX LANCETS MISC 1 application by Does not apply route 4 (four) times daily -  with meals and at bedtime. 01/09/17   Orvilla Cornwallenney, Rachelle A, CNM  acetaminophen (TYLENOL) 500 MG tablet Take 500 mg by mouth every 6 (six) hours as needed for moderate pain or headache.     [provider]  albuterol (PROVENTIL HFA;VENTOLIN  HFA) 108 (90 Base) MCG/ACT inhaler Inhale 2 puffs into the lungs every 6 (six) hours as needed for wheezing or shortness of breath.    [provider]  amLODipine (NORVASC) 10 MG tablet Take 1 tablet (10 mg total) by mouth daily. 04/18/17   Degele, Kandra NicolasJulie P, MD  aspirin 81 MG chewable tablet Chew 1 tablet (81 mg total) by mouth daily. 12/12/16   Hermina StaggersErvin, Michael L, MD  glucose blood (ACCU-CHEK GUIDE) test strip Use as instructed 01/09/17   Orvilla Cornwallenney, Rachelle A, CNM  ibuprofen (ADVIL,MOTRIN) 600 MG tablet Take 1 tablet (600 mg total) by mouth every 6 (six) hours. 04/18/17   Degele, Kandra NicolasJulie P, MD  Lancet Devices MISC 1 Device by Does not apply route 4 (four) times daily. 11/14/16   Hermina StaggersErvin, Michael L, MD  metFORMIN (GLUCOPHAGE) 500 MG tablet Take 1 tablet (500 mg total) by mouth 2 (two) times daily with a meal. 04/18/17   Degele, Kandra NicolasJulie P, MD  omeprazole (PRILOSEC) 20 MG capsule Take 1 capsule (20 mg total) by mouth 2 (two) times daily before a meal. 02/06/17   Brock BadHarper, Charles A, MD  ondansetron (ZOFRAN-ODT) 4 MG disintegrating tablet Take 1 tablet (  4 mg total) by mouth every 8 (eight) hours as needed for nausea or vomiting. Patient not taking: Reported on 04/09/2017 03/13/17   Pincus Large, DO  oseltamivir (TAMIFLU) 75 MG capsule Take 1 capsule (75 mg total) by mouth every 12 (twelve) hours. 08/23/17   Vanetta Mulders, MD  Prenat-FeAsp-Meth-FA-DHA w/o A (PRENATE PIXIE) 10-0.6-0.4-200 MG CAPS Take 1 tablet by mouth daily. 09/19/16   Orvilla Cornwall A, CNM  Vitamin D, Ergocalciferol, (DRISDOL) 50000 units CAPS capsule Take 1 capsule (50,000 Units total) by mouth every 7 (seven) days. 09/30/16   Roe Coombs, CNM    Family History Family History  Problem Relation Age of Onset  . Cancer Maternal Grandmother   . Hypertension Mother   . Fibroids Mother   . Diabetes Paternal Aunt   . Stroke Maternal Grandfather   . Diabetes Paternal Grandmother   . Stroke Paternal Grandmother   . Cancer Father         lung  . Heart disease Paternal Grandfather     Social History Social History   Tobacco Use  . Smoking status: Current Every Day Smoker    Packs/day: 1.00    Years: 18.00    Pack years: 18.00    Types: Cigarettes  . Smokeless tobacco: Never Used  . Tobacco comment: 6 day  Substance Use Topics  . Alcohol use: No    Alcohol/week: 0.0 oz    Comment: rare   . Drug use: No     Allergies   Patient has no known allergies.   Review of Systems Review of Systems   Physical Exam Updated Vital Signs BP 115/74 (BP Location: Right Arm)   Pulse (!) 106   Temp 98.8 F (37.1 C) (Oral)   Resp 16   Ht 1.549 m (5\' 1" )   Wt 110.7 kg (244 lb)   LMP 08/13/2017   SpO2 99%   BMI 46.10 kg/m   Physical Exam   ED Treatments / Results  Labs (all labs ordered are listed, but only abnormal results are displayed) Labs Reviewed - No data to display  EKG None  Radiology Dg Chest 2 View  Result Date: 08/23/2017 CLINICAL DATA:  Flu-like symptoms.  Productive cough, smoker EXAM: CHEST - 2 VIEW COMPARISON:  05/02/2012 FINDINGS: Heart and mediastinal contours are within normal limits. No focal opacities or effusions. No acute bony abnormality. IMPRESSION: No active cardiopulmonary disease. Electronically Signed   By: Charlett Nose M.D.   On: 08/23/2017 09:43    Procedures Procedures (including critical care time)  Medications Ordered in ED Medications - No data to display   Initial Impression / Assessment and Plan / ED Course  I have reviewed the triage vital signs and the nursing notes.  Pertinent labs & imaging results that were available during my care of the patient were reviewed by me and considered in my medical decision making (see chart for details).       Final Clinical Impressions(s) / ED Diagnoses   Final diagnoses:  Flu-like symptoms    ED Discharge Orders        Ordered    oseltamivir (TAMIFLU) 75 MG capsule  Every 12 hours     08/23/17 1018      This  is a duplicate note that cannot be deleted.   Vanetta Mulders, MD 08/23/17 1524

## 2017-09-02 ENCOUNTER — Ambulatory Visit: Payer: Medicaid Other | Admitting: Obstetrics and Gynecology

## 2017-09-13 ENCOUNTER — Ambulatory Visit: Payer: Medicaid Other | Admitting: Student

## 2017-09-16 ENCOUNTER — Encounter: Payer: Self-pay | Admitting: *Deleted

## 2017-12-04 ENCOUNTER — Ambulatory Visit (INDEPENDENT_AMBULATORY_CARE_PROVIDER_SITE_OTHER): Payer: Self-pay | Admitting: Orthopaedic Surgery

## 2017-12-09 ENCOUNTER — Ambulatory Visit (INDEPENDENT_AMBULATORY_CARE_PROVIDER_SITE_OTHER): Payer: Medicaid Other

## 2017-12-09 ENCOUNTER — Ambulatory Visit (INDEPENDENT_AMBULATORY_CARE_PROVIDER_SITE_OTHER): Payer: Medicaid Other | Admitting: Orthopaedic Surgery

## 2017-12-09 ENCOUNTER — Encounter (INDEPENDENT_AMBULATORY_CARE_PROVIDER_SITE_OTHER): Payer: Self-pay | Admitting: Orthopaedic Surgery

## 2017-12-09 DIAGNOSIS — M25511 Pain in right shoulder: Secondary | ICD-10-CM

## 2017-12-09 DIAGNOSIS — M7542 Impingement syndrome of left shoulder: Secondary | ICD-10-CM

## 2017-12-09 DIAGNOSIS — M25512 Pain in left shoulder: Secondary | ICD-10-CM

## 2017-12-09 DIAGNOSIS — G8929 Other chronic pain: Secondary | ICD-10-CM | POA: Diagnosis not present

## 2017-12-09 DIAGNOSIS — M7541 Impingement syndrome of right shoulder: Secondary | ICD-10-CM | POA: Insufficient documentation

## 2017-12-09 MED ORDER — METHYLPREDNISOLONE ACETATE 40 MG/ML IJ SUSP
40.0000 mg | INTRAMUSCULAR | Status: AC | PRN
  Administered 2017-12-09: 40 mg via INTRA_ARTICULAR

## 2017-12-09 MED ORDER — LIDOCAINE HCL 1 % IJ SOLN
3.0000 mL | INTRAMUSCULAR | Status: AC | PRN
  Administered 2017-12-09: 3 mL

## 2017-12-09 NOTE — Progress Notes (Signed)
Office Visit Note   Patient: Michelle Houston           Date of Birth: [birth year]           MRN: [id number] Visit Date: 12/09/2017              Requested by: Fleet ContrasAvbuere, Edwin, MD [address] [place], [place] [id number] PCP: Fleet ContrasAvbuere, Edwin, MD   Assessment & Plan: Visit Diagnoses:  1. Chronic pain of both shoulders   2. Impingement syndrome of right shoulder   3. Impingement syndrome of left shoulder     Plan: She says ibuprofen is helping her quite a bit but she would like to have a steroid injection in both shoulder since this is helped in the past.  She understands it is a diabetic the risk is increasing her blood glucose from these injections if she will watch her control over the next several days.  I agree with trying a steroid injection of both shoulders.  All question concerns were answered and addressed.  I did inject both shoulders with a steroid without difficulty.  Follow-up will be as needed.  Follow-Up Instructions: Return if symptoms worsen or fail to improve.   Orders:  Orders Placed This Encounter  Procedures  . Large Joint Inj  . Large Joint Inj  . XR Shoulder Left  . XR Shoulder Right   No orders of the defined types were placed in this encounter.     Procedures: Large Joint Inj: R subacromial bursa on 12/09/2017 10:08 AM Indications: pain and diagnostic evaluation Details: 22 G 1.5 in needle  Arthrogram: No  Medications: 3 mL lidocaine 1 %; 40 mg methylPREDNISolone acetate 40 MG/ML Outcome: tolerated well, no immediate complications Procedure, treatment alternatives, risks and benefits explained, specific risks discussed. Consent was given by the patient. Immediately prior to procedure a time out was called to verify the correct patient, procedure, equipment, support staff and site/side marked as required. Patient was prepped and draped in the usual sterile fashion.   Large Joint Inj: L subacromial bursa on 12/09/2017 10:08 AM Indications: pain and  diagnostic evaluation Details: 22 G 1.5 in needle  Arthrogram: No  Medications: 3 mL lidocaine 1 %; 40 mg methylPREDNISolone acetate 40 MG/ML Outcome: tolerated well, no immediate complications Procedure, treatment alternatives, risks and benefits explained, specific risks discussed. Consent was given by the patient. Immediately prior to procedure a time out was called to verify the correct patient, procedure, equipment, support staff and site/side marked as required. Patient was prepped and draped in the usual sterile fashion.       Clinical Data: No additional findings.   Subjective: Chief Complaint  Patient presents with  . Right Shoulder - Pain  . Left Shoulder - Pain  The patient is someone I seen before over the years.  Actually provided a steroid injection remotely in the right shoulder.  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  Her shoulder started to flare up with her in January of this year from her repetitive activities.  She denies any specific injury.  She is 37 years old.  She is a diabetic and is on metformin daily.  She denies any specific injury.  She denies loss of motion of her shoulders but reports a burning type of pain and just pain in general with her shoulders mainly with overhead activities and reaching behind her. HPI  Review of Systems She currently denies any headache,  chest pain, shortness of breath, fever, chills, nausea, vomiting.  Objective: Vital Signs: There were no vitals taken for this visit.  Physical Exam She is alert and oriented x3 and in no acute distress examination of both shoulder show excellent range of motion albeit painful when she gets past 90 degrees of abduction of both shoulders.  Her liftoff is negative bilaterally.  She has excellent strength in rotator cuff bilaterally.  She has positive crossarm pain bilaterally.  Her left shoulder seems to hurt worse in the right shoulder. Ortho  Exam  Specialty Comments:  No specialty comments available.  Imaging: Xr Shoulder Left  Result Date: 12/09/2017 3 views of the left shoulder show slight narrowing of the subacromial outlet with otherwise no acute findings.  The shoulder is well located.  Xr Shoulder Right  Result Date: 12/09/2017 3 views of the right shoulder show well located shoulder with no acute findings.  There is slight decrease of the subacromial outlet.    PMFS History: Patient Active Problem List   Diagnosis Date Noted  . Impingement syndrome of right shoulder 12/09/2017  . Impingement syndrome of left shoulder 12/09/2017  . SVD (spontaneous vaginal delivery) 04/18/2017  . At risk for alteration in fetal status 04/16/2017  . Chronic hypertension during pregnancy, antepartum 11/14/2016  . Diabetes mellitus affecting pregnancy, antepartum 10/17/2016  . Low vitamin D level 09/30/2016  . Abnormal Pap smear of cervix 09/30/2016  . AMA (advanced maternal age) multigravida 35+ 09/30/2016  . DM (diabetes mellitus) (HCC) 09/19/2016  . S/P cholecystectomy 09/19/2016  . History of bladder repair surgery 09/19/2016  . H/O carpal tunnel repair 09/19/2016  . Obese 09/19/2016  . Supervision of high risk pregnancy, antepartum 10/13/2013  . Chronic hypertension 10/13/2013   Past Medical History:  Diagnosis Date  . Bronchitis   . Diabetes mellitus    type 2 chronic  . Hypertension   . Obesity   . PCOS (polycystic ovarian syndrome)   . Pneumonia     Family History  Problem Relation Age of Onset  . Cancer Maternal Grandmother   . Hypertension Mother   . Fibroids Mother   . Diabetes Paternal Aunt   . Stroke Maternal Grandfather   . Diabetes Paternal Grandmother   . Stroke Paternal Grandmother   . Cancer Father        lung  . Heart disease Paternal Grandfather     Past Surgical History:  Procedure Laterality Date  . BLADDER REPAIR    . CARPAL TUNNEL RELEASE    . CHOLECYSTECTOMY     Social History    Occupational History  . Not on file  Tobacco Use  . Smoking status: Current Every Day Smoker    Packs/day: 1.00    Years: 18.00    Pack years: 18.00    Types: Cigarettes  . Smokeless tobacco: Never Used  . Tobacco comment: 6 day  Substance and Sexual Activity  . Alcohol use: No    Alcohol/week: 0.0 oz    Comment: rare   . Drug use: No  . Sexual activity: Yes    Partners: Male    Birth control/protection: None

## 2017-12-14 ENCOUNTER — Emergency Department (HOSPITAL_COMMUNITY)
Admission: EM | Admit: 2017-12-14 | Discharge: 2017-12-14 | Disposition: A | Discharge status: Home / Self Care | Payer: Medicaid Other | Attending: Emergency Medicine | Admitting: Emergency Medicine

## 2017-12-14 ENCOUNTER — Encounter (HOSPITAL_COMMUNITY): Payer: Self-pay | Admitting: Emergency Medicine

## 2017-12-14 DIAGNOSIS — Z79899 Other long term (current) drug therapy: Secondary | ICD-10-CM | POA: Diagnosis not present

## 2017-12-14 DIAGNOSIS — I10 Essential (primary) hypertension: Secondary | ICD-10-CM | POA: Diagnosis not present

## 2017-12-14 DIAGNOSIS — M542 Cervicalgia: Secondary | ICD-10-CM | POA: Diagnosis not present

## 2017-12-14 DIAGNOSIS — Z7982 Long term (current) use of aspirin: Secondary | ICD-10-CM | POA: Diagnosis not present

## 2017-12-14 DIAGNOSIS — F17210 Nicotine dependence, cigarettes, uncomplicated: Secondary | ICD-10-CM | POA: Insufficient documentation

## 2017-12-14 DIAGNOSIS — E119 Type 2 diabetes mellitus without complications: Secondary | ICD-10-CM | POA: Diagnosis not present

## 2017-12-14 MED ORDER — METHOCARBAMOL 750 MG PO TABS: 750.0000 mg | ORAL_TABLET | Freq: Three times a day (TID) | ORAL | 0 refills | Status: DC | PRN

## 2017-12-14 NOTE — ED Triage Notes (Signed)
Neck pain started yesterday morning- got cortisone shot in shoulder last week. States took aleve and ibuprofen and did not help. Smoker. Walking makes pain worse and any movement- no nuchal rigidity.

## 2017-12-14 NOTE — ED Notes (Signed)
Declined W/C at D/C and was escorted to lobby by RN. 

## 2017-12-14 NOTE — Discharge Instructions (Signed)
Please take Ibuprofen (Advil, motrin) and Tylenol (acetaminophen) to relieve your pain.  You may take up to 600 MG (3 pills) of normal strength ibuprofen every 8 hours as needed.  In between doses of ibuprofen you make take tylenol, up to 1,000 mg (two extra strength pills).  Do not take more than 3,000 mg tylenol in a 24 hour period.  Please check all medication labels as many medications such as pain and cold medications may contain tylenol.  Do not drink alcohol while taking these medications.  Do not take other NSAID'S while taking ibuprofen (such as aleve or naproxen).  Please take ibuprofen with food to decrease stomach upset.  You are being prescribed a medication which may make you sleepy. For 24 hours after one dose please do not drive, operate heavy machinery, care for a small child with out another adult present, or perform any activities that may cause harm to you or someone else if you were to fall asleep or be impaired.   The best way to get rid of muscle pain is by taking NSAIDS, using heat, massage therapy, and gentle stretching/range of motion exercises.  

## 2017-12-14 NOTE — ED Provider Notes (Signed)
[facility] Provider Note   CSN: [id number] Arrival date & time: 12/14/17  [id number]     History   Chief Complaint Chief Complaint  Patient presents with  . Neck Pain    HPI Michelle Houston is a 37 y.o. female wityh a history of DM, HTN, And smoking.  She woke up yesterday with her neck hurting.  She reports its a constant pulling sensation 7/10.  When moves head to right side pain increases.    Took 800mg  of ibuprofen this morning with out relief.  Heating pad hasn't helped.  No weakness or parsthesias in extremities.   Last Monday had injection in bilateral shoulder for bursitis, fixed shoulder pain but neck pain is new.   She denies any recent traumas.  HPI  Past Medical History:  Diagnosis Date  . Bronchitis   . Diabetes mellitus    type 2 chronic  . Hypertension   . Obesity   . PCOS (polycystic ovarian syndrome)   . Pneumonia     Patient Active Problem List   Diagnosis Date Noted  . Impingement syndrome of right shoulder 12/09/2017  . Impingement syndrome of left shoulder 12/09/2017  . SVD (spontaneous vaginal delivery) 04/18/2017  . At risk for alteration in fetal status 04/16/2017  . Chronic hypertension during pregnancy, antepartum 11/14/2016  . Diabetes mellitus affecting pregnancy, antepartum 10/17/2016  . Low vitamin D level 09/30/2016  . Abnormal Pap smear of cervix 09/30/2016  . AMA (advanced maternal age) multigravida 35+ 09/30/2016  . DM (diabetes mellitus) (HCC) 09/19/2016  . S/P cholecystectomy 09/19/2016  . History of bladder repair surgery 09/19/2016  . H/O carpal tunnel repair 09/19/2016  . Obese 09/19/2016  . Supervision of high risk pregnancy, antepartum 10/13/2013  . Chronic hypertension 10/13/2013    Past Surgical History:  Procedure Laterality Date  . BLADDER REPAIR    . CARPAL TUNNEL RELEASE    . CHOLECYSTECTOMY       OB History    Gravida  5   Para  4   Term  4   Preterm      AB    1   Living  4     SAB  1   TAB      Ectopic      Multiple  0   Live Births  4            Home Medications    Prior to Admission medications   Medication Sig Start Date End Date Taking? Authorizing Provider  ACCU-CHEK FASTCLIX LANCETS MISC 1 application by Does not apply route 4 (four) times daily -  with meals and at bedtime. 01/09/17   Orvilla Cornwallenney, Rachelle A, CNM  acetaminophen (TYLENOL) 500 MG tablet Take 500 mg by mouth every 6 (six) hours as needed for moderate pain or headache.     [provider]  albuterol (PROVENTIL HFA;VENTOLIN HFA) 108 (90 Base) MCG/ACT inhaler Inhale 2 puffs into the lungs every 6 (six) hours as needed for wheezing or shortness of breath.    [provider]  amLODipine (NORVASC) 10 MG tablet Take 1 tablet (10 mg total) by mouth daily. 04/18/17   Degele, Kandra NicolasJulie P, MD  aspirin 81 MG chewable tablet Chew 1 tablet (81 mg total) by mouth daily. 12/12/16   Hermina StaggersErvin, Michael L, MD  glucose blood (ACCU-CHEK GUIDE) test strip Use as instructed 01/09/17   Orvilla Cornwallenney, Rachelle A, CNM  ibuprofen (ADVIL,MOTRIN) 600 MG tablet Take 1 tablet (600  mg total) by mouth every 6 (six) hours. 04/18/17   Degele, Kandra Nicolas, MD  Lancet Devices MISC 1 Device by Does not apply route 4 (four) times daily. 11/14/16   Hermina Staggers, MD  metFORMIN (GLUCOPHAGE) 500 MG tablet Take 1 tablet (500 mg total) by mouth 2 (two) times daily with a meal. 04/18/17   Degele, Kandra Nicolas, MD  methocarbamol (ROBAXIN) 750 MG tablet Take 1-2 tablets (750-1,500 mg total) by mouth 3 (three) times daily as needed for muscle spasms. 12/14/17   Cristina Gong, PA-C  omeprazole (PRILOSEC) 20 MG capsule Take 1 capsule (20 mg total) by mouth 2 (two) times daily before a meal. 02/06/17   Brock Bad, MD  ondansetron (ZOFRAN-ODT) 4 MG disintegrating tablet Take 1 tablet (4 mg total) by mouth every 8 (eight) hours as needed for nausea or vomiting. Patient not taking: Reported on 04/09/2017 03/13/17    Pincus Large, DO  oseltamivir (TAMIFLU) 75 MG capsule Take 1 capsule (75 mg total) by mouth every 12 (twelve) hours. 08/23/17   Vanetta Mulders, MD  Prenat-FeAsp-Meth-FA-DHA w/o A (PRENATE PIXIE) 10-0.6-0.4-200 MG CAPS Take 1 tablet by mouth daily. 09/19/16   Orvilla Cornwall A, CNM  Vitamin D, Ergocalciferol, (DRISDOL) 50000 units CAPS capsule Take 1 capsule (50,000 Units total) by mouth every 7 (seven) days. 09/30/16   Roe Coombs, CNM    Family History Family History  Problem Relation Age of Onset  . Cancer Maternal Grandmother   . Hypertension Mother   . Fibroids Mother   . Diabetes Paternal Aunt   . Stroke Maternal Grandfather   . Diabetes Paternal Grandmother   . Stroke Paternal Grandmother   . Cancer Father        lung  . Heart disease Paternal Grandfather     Social History Social History   Tobacco Use  . Smoking status: Current Every Day Smoker    Packs/day: 1.00    Years: 18.00    Pack years: 18.00    Types: Cigarettes  . Smokeless tobacco: Never Used  . Tobacco comment: 6 day  Substance Use Topics  . Alcohol use: No    Alcohol/week: 0.0 oz    Comment: rare   . Drug use: No     Allergies   Patient has no known allergies.   Review of Systems Review of Systems  Constitutional: Negative for chills and fever.  Musculoskeletal: Positive for neck pain. Negative for neck stiffness.  Neurological: Negative for weakness.  All other systems reviewed and are negative.    Physical Exam Updated Vital Signs BP (!) 142/96 (BP Location: Right Arm)   Pulse (!) 101   Temp (!) 97.4 F (36.3 C) (Oral)   Resp 20   LMP 11/10/2017   SpO2 100%   Physical Exam  Constitutional: She is oriented to person, place, and time. She appears well-developed. No distress.  HENT:  Head: Normocephalic.  Nose: No mucosal edema.  Mouth/Throat: Uvula is midline, oropharynx is clear and moist and mucous membranes are normal. No oropharyngeal exudate, posterior oropharyngeal  edema or posterior oropharyngeal erythema.  Eyes: Conjunctivae are normal.  Neck: Normal range of motion. Neck supple.  Patient shakes her head "no" multiple times during interview with out apparent difficulty.    Cardiovascular: Normal rate, regular rhythm and intact distal pulses.  Pulmonary/Chest: Effort normal and breath sounds normal. No accessory muscle usage. No respiratory distress. She has no decreased breath sounds. She has no wheezes. She has no rhonchi.  She has no rales.  Musculoskeletal:  There is diffuse tenderness to palpation along the right sided superior posterior shoulder and cervical paraspinal muscles.  Palpation over these areas both re-created and exacerbate her pain.    5/5 strength in bilateral upper extremities.  There is no midline C/T-spine tenderness to palpation.  Lymphadenopathy:    She has no cervical adenopathy.  Neurological: She is alert and oriented to person, place, and time.  Skin: Skin is warm and dry. She is not diaphoretic.  Psychiatric: She has a normal mood and affect. Her behavior is normal.  Nursing note and vitals reviewed.    ED Treatments / Results  Labs (all labs ordered are listed, but only abnormal results are displayed) Labs Reviewed - No data to display  EKG None  Radiology No results found.  Procedures Procedures (including critical care time)  Medications Ordered in ED Medications - No data to display   Initial Impression / Assessment and Plan / ED Course  I have reviewed the triage vital signs and the nursing notes.  Pertinent labs & imaging results that were available during my care of the patient were reviewed by me and considered in my medical decision making (see chart for details).    Patient presents today for evaluation of atraumatic right-sided shoulder pain consistent with trapezius muscle spasm.  No history of trauma.  There is no evidence of infection near shoulder injection sites.  Her pain is re-created  with palpation over the trapezius muscle.  Recommended conservative care, gentle stretching, gentle range of motion, heat, and consideration of massage therapy.  Give prescription for Robaxin.  Return precautions discussed, patient states understanding and is agreeable for discharge.  Final Clinical Impressions(s) / ED Diagnoses   Final diagnoses:  Neck pain    ED Discharge Orders        Ordered    methocarbamol (ROBAXIN) 750 MG tablet  3 times daily PRN     12/14/17 1010       Cristina Gong, [place] 12/14/17 1010    Tegeler, Canary Brim, MD 12/14/17 [phone]

## 2018-02-28 ENCOUNTER — Ambulatory Visit (HOSPITAL_COMMUNITY)
Admission: EM | Admit: 2018-02-28 | Discharge: 2018-02-28 | Disposition: A | Discharge status: Home / Self Care | Payer: Medicaid Other | Attending: Family Medicine | Admitting: Family Medicine

## 2018-02-28 ENCOUNTER — Encounter (HOSPITAL_COMMUNITY): Payer: Self-pay | Admitting: Emergency Medicine

## 2018-02-28 DIAGNOSIS — E119 Type 2 diabetes mellitus without complications: Secondary | ICD-10-CM | POA: Insufficient documentation

## 2018-02-28 DIAGNOSIS — E282 Polycystic ovarian syndrome: Secondary | ICD-10-CM | POA: Insufficient documentation

## 2018-02-28 DIAGNOSIS — Z8249 Family history of ischemic heart disease and other diseases of the circulatory system: Secondary | ICD-10-CM | POA: Insufficient documentation

## 2018-02-28 DIAGNOSIS — F17210 Nicotine dependence, cigarettes, uncomplicated: Secondary | ICD-10-CM | POA: Diagnosis not present

## 2018-02-28 DIAGNOSIS — Z7984 Long term (current) use of oral hypoglycemic drugs: Secondary | ICD-10-CM | POA: Diagnosis not present

## 2018-02-28 DIAGNOSIS — Z79899 Other long term (current) drug therapy: Secondary | ICD-10-CM | POA: Diagnosis not present

## 2018-02-28 DIAGNOSIS — E669 Obesity, unspecified: Secondary | ICD-10-CM | POA: Insufficient documentation

## 2018-02-28 DIAGNOSIS — Z7982 Long term (current) use of aspirin: Secondary | ICD-10-CM | POA: Diagnosis not present

## 2018-02-28 DIAGNOSIS — I10 Essential (primary) hypertension: Secondary | ICD-10-CM | POA: Diagnosis not present

## 2018-02-28 DIAGNOSIS — Z9049 Acquired absence of other specified parts of digestive tract: Secondary | ICD-10-CM | POA: Insufficient documentation

## 2018-02-28 DIAGNOSIS — Z791 Long term (current) use of non-steroidal anti-inflammatories (NSAID): Secondary | ICD-10-CM | POA: Diagnosis not present

## 2018-02-28 DIAGNOSIS — L810 Postinflammatory hyperpigmentation: Secondary | ICD-10-CM | POA: Diagnosis not present

## 2018-02-28 DIAGNOSIS — R21 Rash and other nonspecific skin eruption: Secondary | ICD-10-CM | POA: Diagnosis present

## 2018-02-28 DIAGNOSIS — R030 Elevated blood-pressure reading, without diagnosis of hypertension: Secondary | ICD-10-CM | POA: Insufficient documentation

## 2018-02-28 DIAGNOSIS — J029 Acute pharyngitis, unspecified: Secondary | ICD-10-CM | POA: Insufficient documentation

## 2018-02-28 LAB — POCT RAPID STREP A: Streptococcus, Group A Screen (Direct): NEGATIVE

## 2018-02-28 MED ORDER — AMOXICILLIN 500 MG PO CAPS: 500.0000 mg | ORAL_CAPSULE | Freq: Two times a day (BID) | ORAL | 0 refills | Status: AC

## 2018-02-28 MED ORDER — TRIAMCINOLONE ACETONIDE 0.1 % EX CREA: 1.0000 "application " | TOPICAL_CREAM | Freq: Two times a day (BID) | CUTANEOUS | 0 refills | Status: DC

## 2018-02-28 NOTE — Discharge Instructions (Signed)
Strep test negative, will send out for culture and we will call you with results Drink plenty of water and rest Prescribed amoxicillin 500mg  twice daily for 10 days.  Take as directed and to completion.  Follow up with PCP if symptoms persist Return or go to ER if you have any new or worsening symptoms  Blood pressure elevated in office.  Please recheck in 24 hours.  If it continues to be greater than 140/90 please follow up with PCP for further evaluation and management.   Triamcinolone prescribed for rash.  Use as directed and to completion Use OTC antihistamine such as zyrtec/ allegra/ Claritin daily for symptomatic relief of itching Follow up with PCP if symptoms persists Return or go to the ED if you have any new or worsening symptoms

## 2018-02-28 NOTE — ED Triage Notes (Signed)
Pt sts sore throat and rash with itching

## 2018-02-28 NOTE — ED Provider Notes (Signed)
[facility]ENTER   [id number] 02/28/18 Arrival Time: 1535  NW:GNFA THROAT and rash  SUBJECTIVE: History from: patient.  Michelle Houston is a 37 y.o. female who presents with abrupt onset of sore throat x 1 day.  Admits to positive sick exposure to kids with similar symptoms.  Has tried OTC theraflu without relief.  Symptoms are made worse with swallowing, but tolerating liquids and own secretions without difficulty.  Denies previous symptoms in the past. Complains of subjective fever, mild cough, and fatigue.   Denies chills, sinus pain, rhinorrhea, SOB, wheezing, chest pain, nausea, changes in bowel or bladder habits.    Patient also mentions reoccurring rash for the past few months, with recent flare within the past couple of weeks.  Denies a precipitating event or known trigger.  Localizes rash to lower extremities.  Describes as spreading and itching.  Has tried OTC lotions and triamcinolone cream with temporary relief.  Worse with itching.  Denies redness, swelling, discharge, or tick exposure.    ROS: As per HPI.  Past Medical History:  Diagnosis Date  . Bronchitis   . Diabetes mellitus    type 2 chronic  . Hypertension   . Obesity   . PCOS (polycystic ovarian syndrome)   . Pneumonia    Past Surgical History:  Procedure Laterality Date  . BLADDER REPAIR    . CARPAL TUNNEL RELEASE    . CHOLECYSTECTOMY     No Known Allergies No current facility-administered medications on file prior to encounter.    Current Outpatient Medications on File Prior to Encounter  Medication Sig Dispense Refill  . ACCU-CHEK FASTCLIX LANCETS MISC 1 application by Does not apply route 4 (four) times daily -  with meals and at bedtime. 102 each 12  . acetaminophen (TYLENOL) 500 MG tablet Take 500 mg by mouth every 6 (six) hours as needed for moderate pain or headache.     . albuterol (PROVENTIL HFA;VENTOLIN HFA) 108 (90 Base) MCG/ACT inhaler Inhale 2 puffs into the lungs every 6 (six) hours  as needed for wheezing or shortness of breath.    Marland Kitchen amLODipine (NORVASC) 10 MG tablet Take 1 tablet (10 mg total) by mouth daily. 90 tablet 0  . aspirin 81 MG chewable tablet Chew 1 tablet (81 mg total) by mouth daily. 30 tablet 12  . glucose blood (ACCU-CHEK GUIDE) test strip Use as instructed 100 each 12  . ibuprofen (ADVIL,MOTRIN) 600 MG tablet Take 1 tablet (600 mg total) by mouth every 6 (six) hours. 60 tablet 0  . Lancet Devices MISC 1 Device by Does not apply route 4 (four) times daily. 100 each 11  . metFORMIN (GLUCOPHAGE) 500 MG tablet Take 1 tablet (500 mg total) by mouth 2 (two) times daily with a meal. 60 tablet 3  . methocarbamol (ROBAXIN) 750 MG tablet Take 1-2 tablets (750-1,500 mg total) by mouth 3 (three) times daily as needed for muscle spasms. 18 tablet 0  . omeprazole (PRILOSEC) 20 MG capsule Take 1 capsule (20 mg total) by mouth 2 (two) times daily before a meal. 60 capsule 5  . ondansetron (ZOFRAN-ODT) 4 MG disintegrating tablet Take 1 tablet (4 mg total) by mouth every 8 (eight) hours as needed for nausea or vomiting. (Patient not taking: Reported on 04/09/2017) 20 tablet 0  . oseltamivir (TAMIFLU) 75 MG capsule Take 1 capsule (75 mg total) by mouth every 12 (twelve) hours. 10 capsule 0  . Prenat-FeAsp-Meth-FA-DHA w/o A (PRENATE PIXIE) 10-0.6-0.4-200 MG CAPS Take 1 tablet  by mouth daily. 30 capsule 12  . Vitamin D, Ergocalciferol, (DRISDOL) 50000 units CAPS capsule Take 1 capsule (50,000 Units total) by mouth every 7 (seven) days. 30 capsule 2   Social History   Socioeconomic History  . Marital status: Legally Separated    Spouse name: Not on file  . Number of children: Not on file  . Years of education: Not on file  . Highest education level: Not on file  Occupational History  . Not on file  Social Needs  . Financial resource strain: Not on file  . Food insecurity:    Worry: Not on file    Inability: Not on file  . Transportation needs:    Medical: Not on file     Non-medical: Not on file  Tobacco Use  . Smoking status: Current Every Day Smoker    Packs/day: 1.00    Years: 18.00    Pack years: 18.00    Types: Cigarettes  . Smokeless tobacco: Never Used  . Tobacco comment: 6 day  Substance and Sexual Activity  . Alcohol use: No    Alcohol/week: 0.0 standard drinks    Comment: rare   . Drug use: No  . Sexual activity: Yes    Partners: Male    Birth control/protection: None  Lifestyle  . Physical activity:    Days per week: Not on file    Minutes per session: Not on file  . Stress: Not on file  Relationships  . Social connections:    Talks on phone: Not on file    Gets together: Not on file    Attends religious service: Not on file    Active member of club or organization: Not on file    Attends meetings of clubs or organizations: Not on file    Relationship status: Not on file  . Intimate partner violence:    Fear of current or ex partner: Not on file    Emotionally abused: Not on file    Physically abused: Not on file    Forced sexual activity: Not on file  Other Topics Concern  . Not on file  Social History Narrative  . Not on file   Family History  Problem Relation Age of Onset  . Cancer Maternal Grandmother   . Hypertension Mother   . Fibroids Mother   . Diabetes Paternal Aunt   . Stroke Maternal Grandfather   . Diabetes Paternal Grandmother   . Stroke Paternal Grandmother   . Cancer Father        lung  . Heart disease Paternal Grandfather     OBJECTIVE:  Vitals:   02/28/18 1648  BP: (!) 150/99  Pulse: 97  Resp: 18  Temp: 98.2 F (36.8 C)  TempSrc: Oral  SpO2: 97%     General appearance: alert; appears fatigued; nontoxic appearance HEENT: Ears: EACs clear, TMs pearly gray with visible cone of light, without erythema; Eyes: PERRL, EOMI grossly; Nose: clear rhinorrhea; Throat: oropharynx clear, tonsils 2+ and erythematous with white tonsillar exudates, uvula midline Neck: supple without LAD Lungs: CTA  bilaterally without adventitious breath sounds Heart: regular rate and rhythm.  Radial pulses 2+ symmetrical bilaterally Skin: warm and dry; flat circular areas of hyperpigmentation diffuse about the bilateral lower extremities; nontender to palpation; does not blanch with pressure (see pictures below) Psychological: alert and cooperative; normal mood and affect          Labs: Results for orders placed or performed during the hospital encounter of 02/28/18 (  from the past 24 hour(s))  POCT rapid strep A [facility])     Status: None   Collection Time: 02/28/18  5:26 PM  Result Value Ref Range   Streptococcus, Group A Screen (Direct) NEGATIVE NEGATIVE     ASSESSMENT & PLAN:  1. Acute pharyngitis, unspecified etiology   2. Elevated blood pressure reading   3. Postinflammatory hyperpigmentation    Discussed patient case with Dr. Milus Glazier.  He reviewed pictures of rash and recommended triamcinolone.  Patient will follow up with PCP if symptoms persists despite treatment.    Meds ordered this encounter  Medications  . amoxicillin (AMOXIL) 500 MG capsule    Sig: Take 1 capsule (500 mg total) by mouth 2 (two) times daily for 10 days.    Dispense:  20 capsule    Refill:  0    Order Specific Question:   Supervising Provider    Answer:   Isa Rankin [phone]  . triamcinolone cream (KENALOG) 0.1 %    Sig: Apply 1 application topically 2 (two) times daily.    Dispense:  30 g    Refill:  0    Order Specific Question:   Supervising Provider    Answer:   Isa Rankin [[id number]]    Strep test negative, will send out for culture and we will call you with results Drink plenty of water and rest Prescribed amoxicillin 500mg  twice daily for 10 days.  Take as directed and to completion.  Follow up with PCP if symptoms persist Return or go to ER if you have any new or worsening symptoms  Blood pressure elevated in office.  Please recheck in 24 hours.  If it continues  to be greater than 140/90 please follow up with PCP for further evaluation and management.   Triamcinolone prescribed for rash.  Use as directed and to completion Use OTC antihistamine such as zyrtec/ allegra/ Claritin daily for symptomatic relief of itching Follow up with PCP if symptoms persists Return or go to the ED if you have any new or worsening symptoms    Reviewed expectations re: course of current medical issues. Questions answered. Outlined signs and symptoms indicating need for more acute intervention. Patient verbalized understanding. After Visit Summary given.        Rennis Harding, PA-C 02/28/18 1831

## 2018-03-03 LAB — CULTURE, GROUP A STREP (THRC)

## 2018-06-19 ENCOUNTER — Other Ambulatory Visit: Payer: Self-pay

## 2018-06-19 ENCOUNTER — Emergency Department (HOSPITAL_COMMUNITY)
Admission: EM | Admit: 2018-06-19 | Discharge: 2018-06-19 | Disposition: A | Discharge status: Home / Self Care | Payer: Medicaid Other | Attending: Emergency Medicine | Admitting: Emergency Medicine

## 2018-06-19 ENCOUNTER — Encounter (HOSPITAL_COMMUNITY): Payer: Self-pay

## 2018-06-19 ENCOUNTER — Encounter (HOSPITAL_BASED_OUTPATIENT_CLINIC_OR_DEPARTMENT_OTHER): Payer: Self-pay

## 2018-06-19 ENCOUNTER — Emergency Department (HOSPITAL_BASED_OUTPATIENT_CLINIC_OR_DEPARTMENT_OTHER)
Admission: EM | Admit: 2018-06-19 | Discharge: 2018-06-19 | Disposition: A | Discharge status: Home / Self Care | Payer: Medicaid Other | Source: Home / Self Care | Attending: Emergency Medicine | Admitting: Emergency Medicine

## 2018-06-19 DIAGNOSIS — R509 Fever, unspecified: Secondary | ICD-10-CM | POA: Diagnosis not present

## 2018-06-19 DIAGNOSIS — Z79899 Other long term (current) drug therapy: Secondary | ICD-10-CM | POA: Insufficient documentation

## 2018-06-19 DIAGNOSIS — F17210 Nicotine dependence, cigarettes, uncomplicated: Secondary | ICD-10-CM

## 2018-06-19 DIAGNOSIS — Z5321 Procedure and treatment not carried out due to patient leaving prior to being seen by health care provider: Secondary | ICD-10-CM | POA: Insufficient documentation

## 2018-06-19 DIAGNOSIS — Z7982 Long term (current) use of aspirin: Secondary | ICD-10-CM

## 2018-06-19 DIAGNOSIS — R05 Cough: Secondary | ICD-10-CM | POA: Diagnosis present

## 2018-06-19 DIAGNOSIS — E119 Type 2 diabetes mellitus without complications: Secondary | ICD-10-CM | POA: Insufficient documentation

## 2018-06-19 DIAGNOSIS — J111 Influenza due to unidentified influenza virus with other respiratory manifestations: Secondary | ICD-10-CM | POA: Insufficient documentation

## 2018-06-19 DIAGNOSIS — R69 Illness, unspecified: Principal | ICD-10-CM

## 2018-06-19 DIAGNOSIS — Z7984 Long term (current) use of oral hypoglycemic drugs: Secondary | ICD-10-CM

## 2018-06-19 DIAGNOSIS — I10 Essential (primary) hypertension: Secondary | ICD-10-CM | POA: Insufficient documentation

## 2018-06-19 MED ORDER — ACETAMINOPHEN 325 MG PO TABS
650.0000 mg | ORAL_TABLET | Freq: Once | ORAL | Status: AC | PRN
  Administered 2018-06-19: 650 mg via ORAL
  Filled 2018-06-19: qty 2

## 2018-06-19 MED ORDER — OSELTAMIVIR PHOSPHATE 75 MG PO CAPS: 75.0000 mg | ORAL_CAPSULE | Freq: Two times a day (BID) | ORAL | 0 refills | Status: DC

## 2018-06-19 MED ORDER — OSELTAMIVIR PHOSPHATE 75 MG PO CAPS
75.0000 mg | ORAL_CAPSULE | Freq: Once | ORAL | Status: AC
  Administered 2018-06-19: 75 mg via ORAL
  Filled 2018-06-19: qty 1

## 2018-06-19 MED ORDER — BENZONATATE 200 MG PO CAPS: 200.0000 mg | ORAL_CAPSULE | Freq: Three times a day (TID) | ORAL | 0 refills | Status: DC | PRN

## 2018-06-19 NOTE — ED Provider Notes (Signed)
[facility] EMERGENCY DEPARTMENT Provider Note   CSN: [id number] Arrival date & time: 06/19/18  2222     History   Chief Complaint Chief Complaint  Patient presents with  . Cough    HPI Michelle Houston is a 38 y.o. female.  Patient presents to the emergency department for evaluation of headache, malaise, generalized body aches, chills, cough.  Symptoms began yesterday while at work.  No nausea, vomiting or diarrhea.  No chest pain or shortness of breath.     Past Medical History:  Diagnosis Date  . Bronchitis   . Diabetes mellitus    type 2 chronic  . Hypertension   . Obesity   . PCOS (polycystic ovarian syndrome)   . Pneumonia     Patient Active Problem List   Diagnosis Date Noted  . Impingement syndrome of right shoulder 12/09/2017  . Impingement syndrome of left shoulder 12/09/2017  . SVD (spontaneous vaginal delivery) 04/18/2017  . At risk for alteration in fetal status 04/16/2017  . Chronic hypertension during pregnancy, antepartum 11/14/2016  . Diabetes mellitus affecting pregnancy, antepartum 10/17/2016  . Low vitamin D level 09/30/2016  . Abnormal Pap smear of cervix 09/30/2016  . AMA (advanced maternal age) multigravida 35+ 09/30/2016  . DM (diabetes mellitus) (HCC) 09/19/2016  . S/P cholecystectomy 09/19/2016  . History of bladder repair surgery 09/19/2016  . H/O carpal tunnel repair 09/19/2016  . Obese 09/19/2016  . Supervision of high risk pregnancy, antepartum 10/13/2013  . Chronic hypertension 10/13/2013    Past Surgical History:  Procedure Laterality Date  . BLADDER REPAIR    . CARPAL TUNNEL RELEASE    . CHOLECYSTECTOMY       OB History    Gravida  5   Para  4   Term  4   Preterm      AB  1   Living  4     SAB  1   TAB      Ectopic      Multiple  0   Live Births  4            Home Medications    Prior to Admission medications   Medication Sig Start Date End Date Taking? Authorizing Provider    ACCU-CHEK FASTCLIX LANCETS MISC 1 application by Does not apply route 4 (four) times daily -  with meals and at bedtime. 01/09/17   Orvilla Cornwall A, CNM  acetaminophen (TYLENOL) 500 MG tablet Take 500 mg by mouth every 6 (six) hours as needed for moderate pain or headache.     [provider]  albuterol (PROVENTIL HFA;VENTOLIN HFA) 108 (90 Base) MCG/ACT inhaler Inhale 2 puffs into the lungs every 6 (six) hours as needed for wheezing or shortness of breath.    [provider]  amLODipine (NORVASC) 10 MG tablet Take 1 tablet (10 mg total) by mouth daily. 04/18/17   Degele, Kandra Nicolas, MD  aspirin 81 MG chewable tablet Chew 1 tablet (81 mg total) by mouth daily. 12/12/16   Hermina Staggers, MD  benzonatate (TESSALON) 200 MG capsule Take 1 capsule (200 mg total) by mouth 3 (three) times daily as needed for cough. 06/19/18   Gilda Crease, MD  glucose blood (ACCU-CHEK GUIDE) test strip Use as instructed 01/09/17   Orvilla Cornwall A, CNM  ibuprofen (ADVIL,MOTRIN) 600 MG tablet Take 1 tablet (600 mg total) by mouth every 6 (six) hours. 04/18/17   Degele, Kandra Nicolas, MD  Lancet Devices MISC  1 Device by Does not apply route 4 (four) times daily. 11/14/16   Hermina StaggersErvin, Michael L, MD  metFORMIN (GLUCOPHAGE) 500 MG tablet Take 1 tablet (500 mg total) by mouth 2 (two) times daily with a meal. 04/18/17   Degele, Kandra NicolasJulie P, MD  methocarbamol (ROBAXIN) 750 MG tablet Take 1-2 tablets (750-1,500 mg total) by mouth 3 (three) times daily as needed for muscle spasms. 12/14/17   Cristina GongHammond, Elizabeth W, PA-C  omeprazole (PRILOSEC) 20 MG capsule Take 1 capsule (20 mg total) by mouth 2 (two) times daily before a meal. 02/06/17   Brock BadHarper, Charles A, MD  ondansetron (ZOFRAN-ODT) 4 MG disintegrating tablet Take 1 tablet (4 mg total) by mouth every 8 (eight) hours as needed for nausea or vomiting. Patient not taking: Reported on 04/09/2017 03/13/17   Pincus LargePhelps, Jazma Y, DO  oseltamivir (TAMIFLU) 75 MG capsule Take 1 capsule  (75 mg total) by mouth every 12 (twelve) hours. 06/19/18   Gilda CreasePollina, Elorah Dewing J, MD  Prenat-FeAsp-Meth-FA-DHA w/o A (PRENATE PIXIE) 10-0.6-0.4-200 MG CAPS Take 1 tablet by mouth daily. 09/19/16   Orvilla Cornwallenney, Rachelle A, CNM  triamcinolone cream (KENALOG) 0.1 % Apply 1 application topically 2 (two) times daily. 02/28/18   Wurst, GrenadaBrittany, PA-C  Vitamin D, Ergocalciferol, (DRISDOL) 50000 units CAPS capsule Take 1 capsule (50,000 Units total) by mouth every 7 (seven) days. 09/30/16   Roe Coombsenney, Rachelle A, CNM    Family History Family History  Problem Relation Age of Onset  . Cancer Maternal Grandmother   . Hypertension Mother   . Fibroids Mother   . Diabetes Paternal Aunt   . Stroke Maternal Grandfather   . Diabetes Paternal Grandmother   . Stroke Paternal Grandmother   . Cancer Father        lung  . Heart disease Paternal Grandfather     Social History Social History   Tobacco Use  . Smoking status: Current Every Day Smoker    Packs/day: 1.00    Years: 18.00    Pack years: 18.00    Types: Cigarettes  . Smokeless tobacco: Never Used  . Tobacco comment: 6 day  Substance Use Topics  . Alcohol use: No    Alcohol/week: 0.0 standard drinks  . Drug use: No     Allergies   Patient has no known allergies.   Review of Systems Review of Systems  Constitutional: Positive for chills.  Respiratory: Positive for cough.   Musculoskeletal: Positive for myalgias.  All other systems reviewed and are negative.    Physical Exam Updated Vital Signs BP 135/78 (BP Location: Left Arm)   Pulse (!) 120   Temp 99.4 F (37.4 C) (Oral)   Resp (!) 24   Ht 5' 1.5" (1.562 m)   Wt 99.8 kg   LMP 06/14/2018   SpO2 96%   BMI 40.90 kg/m   Physical Exam Vitals signs and nursing note reviewed.  Constitutional:      General: She is not in acute distress.    Appearance: Normal appearance. She is well-developed.  HENT:     Head: Normocephalic and atraumatic.     Right Ear: Hearing normal.      Left Ear: Hearing normal.     Nose: Nose normal.  Eyes:     Conjunctiva/sclera: Conjunctivae normal.     Pupils: Pupils are equal, round, and reactive to light.  Neck:     Musculoskeletal: Normal range of motion and neck supple.  Cardiovascular:     Rate and Rhythm: Regular rhythm.  Heart sounds: S1 normal and S2 normal. No murmur. No friction rub. No gallop.   Pulmonary:     Effort: Pulmonary effort is normal. No respiratory distress.     Breath sounds: Normal breath sounds.  Chest:     Chest wall: No tenderness.  Abdominal:     General: Bowel sounds are normal.     Palpations: Abdomen is soft.     Tenderness: There is no abdominal tenderness. There is no guarding or rebound. Negative signs include Murphy's sign and McBurney's sign.     Hernia: No hernia is present.  Musculoskeletal: Normal range of motion.  Skin:    General: Skin is warm and dry.     Findings: No rash.  Neurological:     Mental Status: She is alert and oriented to person, place, and time.     GCS: GCS eye subscore is 4. GCS verbal subscore is 5. GCS motor subscore is 6.     Cranial Nerves: No cranial nerve deficit.     Sensory: No sensory deficit.     Coordination: Coordination normal.  Psychiatric:        Speech: Speech normal.        Behavior: Behavior normal.        Thought Content: Thought content normal.      ED Treatments / Results  Labs (all labs ordered are listed, but only abnormal results are displayed) Labs Reviewed - No data to display  EKG None  Radiology No results found.  Procedures Procedures (including critical care time)  Medications Ordered in ED Medications  oseltamivir (TAMIFLU) capsule 75 mg (has no administration in time range)     Initial Impression / Assessment and Plan / ED Course  I have reviewed the triage vital signs and the nursing notes.  Pertinent labs & imaging results that were available during my care of the patient were reviewed by me and considered  in my medical decision making (see chart for details).     Presents to the emergency department with flulike symptoms.  Lungs are clear, no wheezing or clinical signs of pneumonia.  Vital signs are unremarkable.  Patient appears well.  Will treat empirically for flu.  Final Clinical Impressions(s) / ED Diagnoses   Final diagnoses:  Influenza-like illness    ED Discharge Orders         Ordered    benzonatate (TESSALON) 200 MG capsule  3 times daily PRN     06/19/18 2312    oseltamivir (TAMIFLU) 75 MG capsule  Every 12 hours     06/19/18 2312           Gilda CreasePollina, Kesleigh Morson J, MD 06/19/18 2312

## 2018-06-19 NOTE — ED Triage Notes (Signed)
Pt came to sort RN and stated she was not going to stay.  Her plan is to go to [facility].

## 2018-06-19 NOTE — ED Triage Notes (Signed)
Pt c/o general malaise, cough and fever since yesterday afternoon.

## 2018-06-19 NOTE — ED Triage Notes (Signed)
C/o flu like sx day 2-NAD-steady gait-pt states she LWBS [place] just PTA

## 2018-06-25 ENCOUNTER — Emergency Department (HOSPITAL_BASED_OUTPATIENT_CLINIC_OR_DEPARTMENT_OTHER): Payer: Medicaid Other

## 2018-06-25 ENCOUNTER — Emergency Department (HOSPITAL_BASED_OUTPATIENT_CLINIC_OR_DEPARTMENT_OTHER)
Admission: EM | Admit: 2018-06-25 | Discharge: 2018-06-25 | Disposition: A | Discharge status: Home / Self Care | Payer: Medicaid Other | Attending: Emergency Medicine | Admitting: Emergency Medicine

## 2018-06-25 ENCOUNTER — Encounter (HOSPITAL_BASED_OUTPATIENT_CLINIC_OR_DEPARTMENT_OTHER): Payer: Self-pay | Admitting: Emergency Medicine

## 2018-06-25 DIAGNOSIS — Z7982 Long term (current) use of aspirin: Secondary | ICD-10-CM | POA: Insufficient documentation

## 2018-06-25 DIAGNOSIS — Z7984 Long term (current) use of oral hypoglycemic drugs: Secondary | ICD-10-CM | POA: Diagnosis not present

## 2018-06-25 DIAGNOSIS — F17210 Nicotine dependence, cigarettes, uncomplicated: Secondary | ICD-10-CM | POA: Diagnosis not present

## 2018-06-25 DIAGNOSIS — E119 Type 2 diabetes mellitus without complications: Secondary | ICD-10-CM | POA: Diagnosis not present

## 2018-06-25 DIAGNOSIS — J069 Acute upper respiratory infection, unspecified: Secondary | ICD-10-CM | POA: Diagnosis not present

## 2018-06-25 DIAGNOSIS — Z79899 Other long term (current) drug therapy: Secondary | ICD-10-CM | POA: Insufficient documentation

## 2018-06-25 DIAGNOSIS — I10 Essential (primary) hypertension: Secondary | ICD-10-CM | POA: Insufficient documentation

## 2018-06-25 DIAGNOSIS — R05 Cough: Secondary | ICD-10-CM | POA: Diagnosis present

## 2018-06-25 MED ORDER — FLUTICASONE PROPIONATE 50 MCG/ACT NA SUSP: 1.0000 | Freq: Every day | NASAL | 2 refills | Status: DC

## 2018-06-25 MED ORDER — ALBUTEROL SULFATE HFA 108 (90 BASE) MCG/ACT IN AERS: 2.0000 | INHALATION_SPRAY | Freq: Four times a day (QID) | RESPIRATORY_TRACT | 0 refills | Status: DC | PRN

## 2018-06-25 NOTE — Discharge Instructions (Addendum)
Saline sinus rinse twice daily.  Flonase daily.  Take Zyrtec daily. Take Tessalon as prescribed at previous visit for cough as needed.  Use inhaler as needed as prescribed for cough. Take Coricidin HBP for symptom relief. Recheck with your doctor.

## 2018-06-25 NOTE — ED Triage Notes (Signed)
Was seen here last Thursday and dx with Flu and completed 5 days of Tamiflu. Last 2 days is having SOB and cough, not feeling any better.

## 2018-06-25 NOTE — ED Notes (Signed)
PT states understanding of care given, follow up care, and medication prescribed. PT ambulated from ED to car with a steady gait. 

## 2018-06-25 NOTE — ED Notes (Signed)
Patient is A&Ox4.  No signs of distress noted.  Please see providers complete history and physical exam.  

## 2018-06-26 NOTE — ED Provider Notes (Signed)
[facility] EMERGENCY DEPARTMENT Provider Note   CSN: [id number] Arrival date & time: 06/25/18  1928     History   Chief Complaint Chief Complaint  Patient presents with  . Cough    HPI Michelle Houston is a 38 y.o. female.  38yo female presents with complaint of ongoing cough and congestion. Patient was seen in the ER 1 week ago, diagnosed with the flu and given Tamiflu. Patient states she took the Tamiflu but continues to feel unwell. Patient is not taking anything for her symptoms at this time. Patient is a daily smoker, denies history of asthma or chronic lung disease. No other complaints or concerns.      Past Medical History:  Diagnosis Date  . Bronchitis   . Diabetes mellitus    type 2 chronic  . Hypertension   . Obesity   . PCOS (polycystic ovarian syndrome)   . Pneumonia     Patient Active Problem List   Diagnosis Date Noted  . Impingement syndrome of right shoulder 12/09/2017  . Impingement syndrome of left shoulder 12/09/2017  . SVD (spontaneous vaginal delivery) 04/18/2017  . At risk for alteration in fetal status 04/16/2017  . Chronic hypertension during pregnancy, antepartum 11/14/2016  . Diabetes mellitus affecting pregnancy, antepartum 10/17/2016  . Low vitamin D level 09/30/2016  . Abnormal Pap smear of cervix 09/30/2016  . AMA (advanced maternal age) multigravida 35+ 09/30/2016  . DM (diabetes mellitus) (HCC) 09/19/2016  . S/P cholecystectomy 09/19/2016  . History of bladder repair surgery 09/19/2016  . H/O carpal tunnel repair 09/19/2016  . Obese 09/19/2016  . Supervision of high risk pregnancy, antepartum 10/13/2013  . Chronic hypertension 10/13/2013    Past Surgical History:  Procedure Laterality Date  . BLADDER REPAIR    . CARPAL TUNNEL RELEASE    . CHOLECYSTECTOMY       OB History    Gravida  5   Para  4   Term  4   Preterm      AB  1   Living  4     SAB  1   TAB      Ectopic      Multiple  0   Live  Births  4            Home Medications    Prior to Admission medications   Medication Sig Start Date End Date Taking? Authorizing Provider  ACCU-CHEK FASTCLIX LANCETS MISC 1 application by Does not apply route 4 (four) times daily -  with meals and at bedtime. 01/09/17   Orvilla Cornwallenney, Rachelle A, CNM  acetaminophen (TYLENOL) 500 MG tablet Take 500 mg by mouth every 6 (six) hours as needed for moderate pain or headache.     [provider]  albuterol (PROVENTIL HFA;VENTOLIN HFA) 108 (90 Base) MCG/ACT inhaler Inhale 2 puffs into the lungs every 6 (six) hours as needed for wheezing or shortness of breath. 06/25/18   Jeannie FendMurphy, Kateena Degroote A, PA-C  amLODipine (NORVASC) 10 MG tablet Take 1 tablet (10 mg total) by mouth daily. 04/18/17   Degele, Kandra NicolasJulie P, MD  aspirin 81 MG chewable tablet Chew 1 tablet (81 mg total) by mouth daily. 12/12/16   Hermina StaggersErvin, Michael L, MD  benzonatate (TESSALON) 200 MG capsule Take 1 capsule (200 mg total) by mouth 3 (three) times daily as needed for cough. 06/19/18   Gilda CreasePollina, Christopher J, MD  fluticasone (FLONASE) 50 MCG/ACT nasal spray Place 1 spray into both nostrils daily. 06/25/18  Army Melia A, PA-C  glucose blood (ACCU-CHEK GUIDE) test strip Use as instructed 01/09/17   Orvilla Cornwall A, CNM  ibuprofen (ADVIL,MOTRIN) 600 MG tablet Take 1 tablet (600 mg total) by mouth every 6 (six) hours. 04/18/17   Degele, Kandra Nicolas, MD  Lancet Devices MISC 1 Device by Does not apply route 4 (four) times daily. 11/14/16   Hermina Staggers, MD  metFORMIN (GLUCOPHAGE) 500 MG tablet Take 1 tablet (500 mg total) by mouth 2 (two) times daily with a meal. 04/18/17   Degele, Kandra Nicolas, MD  methocarbamol (ROBAXIN) 750 MG tablet Take 1-2 tablets (750-1,500 mg total) by mouth 3 (three) times daily as needed for muscle spasms. 12/14/17   Cristina Gong, PA-C  omeprazole (PRILOSEC) 20 MG capsule Take 1 capsule (20 mg total) by mouth 2 (two) times daily before a meal. 02/06/17   Brock Bad, MD    ondansetron (ZOFRAN-ODT) 4 MG disintegrating tablet Take 1 tablet (4 mg total) by mouth every 8 (eight) hours as needed for nausea or vomiting. Patient not taking: Reported on 04/09/2017 03/13/17   Pincus Large, DO  oseltamivir (TAMIFLU) 75 MG capsule Take 1 capsule (75 mg total) by mouth every 12 (twelve) hours. 06/19/18   Gilda Crease, MD  Prenat-FeAsp-Meth-FA-DHA w/o A (PRENATE PIXIE) 10-0.6-0.4-200 MG CAPS Take 1 tablet by mouth daily. 09/19/16   Orvilla Cornwall A, CNM  triamcinolone cream (KENALOG) 0.1 % Apply 1 application topically 2 (two) times daily. 02/28/18   Wurst, [place], PA-C  Vitamin D, Ergocalciferol, (DRISDOL) 50000 units CAPS capsule Take 1 capsule (50,000 Units total) by mouth every 7 (seven) days. 09/30/16   Roe Coombs, CNM    Family History Family History  Problem Relation Age of Onset  . Cancer Maternal Grandmother   . Hypertension Mother   . Fibroids Mother   . Diabetes Paternal Aunt   . Stroke Maternal Grandfather   . Diabetes Paternal Grandmother   . Stroke Paternal Grandmother   . Cancer Father        lung  . Heart disease Paternal Grandfather     Social History Social History   Tobacco Use  . Smoking status: Current Every Day Smoker    Packs/day: 1.00    Years: 18.00    Pack years: 18.00    Types: Cigarettes  . Smokeless tobacco: Never Used  . Tobacco comment: 6 day  Substance Use Topics  . Alcohol use: No    Alcohol/week: 0.0 standard drinks  . Drug use: No     Allergies   Patient has no known allergies.   Review of Systems Review of Systems  Constitutional: Negative for chills and fever.  HENT: Positive for congestion and postnasal drip. Negative for ear pain, sinus pressure, sinus pain, sneezing and sore throat.   Respiratory: Positive for cough. Negative for shortness of breath and wheezing.   Skin: Negative for rash and wound.  Allergic/Immunologic: Positive for immunocompromised state.  Neurological: Negative  for dizziness, weakness and headaches.  Hematological: Negative for adenopathy.  Psychiatric/Behavioral: Negative for confusion.  All other systems reviewed and are negative.    Physical Exam Updated Vital Signs BP 119/85 (BP Location: Right Arm)   Pulse (!) 106   Temp 98.5 F (36.9 C) (Oral)   Resp 20   Ht 5\' 1"  (1.549 m)   Wt 100 kg   LMP 06/14/2018   SpO2 95%   BMI 41.66 kg/m   Physical Exam Vitals signs and nursing note  reviewed.  Constitutional:      General: She is not in acute distress.    Appearance: She is well-developed. She is not diaphoretic.  HENT:     Head: Normocephalic and atraumatic.     Right Ear: Tympanic membrane and ear canal normal.     Left Ear: Tympanic membrane and ear canal normal.     Nose: Mucosal edema and congestion present. No rhinorrhea.     Mouth/Throat:     Mouth: Mucous membranes are moist.     Pharynx: No oropharyngeal exudate or posterior oropharyngeal erythema.  Eyes:     Conjunctiva/sclera: Conjunctivae normal.  Neck:     Musculoskeletal: Neck supple.  Cardiovascular:     Rate and Rhythm: Normal rate and regular rhythm.     Pulses: Normal pulses.     Heart sounds: Normal heart sounds. No murmur.  Pulmonary:     Effort: Pulmonary effort is normal.     Breath sounds: Normal breath sounds.  Lymphadenopathy:     Cervical: No cervical adenopathy.  Skin:    General: Skin is warm and dry.     Findings: No erythema or rash.  Neurological:     Mental Status: She is alert and oriented to person, place, and time.  Psychiatric:        Behavior: Behavior normal.      ED Treatments / Results  Labs (all labs ordered are listed, but only abnormal results are displayed) Labs Reviewed - No data to display  EKG None  Radiology Dg Chest 2 View  Result Date: 06/25/2018 CLINICAL DATA:  Shortness of breath for 2 days, cough for 1 week. Recent diagnosis of flu. History of bronchitis. EXAM: CHEST - 2 VIEW COMPARISON:  Chest radiograph  August 23, 2017 FINDINGS: Cardiomediastinal silhouette is normal. No pleural effusions or focal consolidations. Trachea projects midline and there is no pneumothorax. Soft tissue planes and included osseous structures are non-suspicious. Mild thoracic spondylosis. Larger body habitus. IMPRESSION: No acute cardiopulmonary process. Electronically Signed   By: Awilda Metro M.D.   On: 06/25/2018 20:41    Procedures Procedures (including critical care time)  Medications Ordered in ED Medications - No data to display   Initial Impression / Assessment and Plan / ED Course  I have reviewed the triage vital signs and the nursing notes.  Pertinent labs & imaging results that were available during my care of the patient were reviewed by me and considered in my medical decision making (see chart for details).  Clinical Course as of Jun 26 13  Thu Jun 26, 2018  0013 38 yo female with ongoing cough and congestion after flu treated with Tamiflu 1 week ago. On exam, boggy nasal membranes, congestion, non productive cough, no wheezing. CXR negative for pna or acute process. Recommend saline sinus rinse, flonase, zyrtec, coricidin HBP, tessalon as previously prescribed, use inhaler as previously prescribed. Recheck with PCP.    [LM]    Clinical Course User Index [LM] Jeannie Fend, PA-C   Final Clinical Impressions(s) / ED Diagnoses   Final diagnoses:  Viral upper respiratory tract infection    ED Discharge Orders         Ordered    albuterol (PROVENTIL HFA;VENTOLIN HFA) 108 (90 Base) MCG/ACT inhaler  Every 6 hours PRN     06/25/18 2311    fluticasone (FLONASE) 50 MCG/ACT nasal spray  Daily     06/25/18 2311           Jeannie Fend,  PA-C 06/26/18 0015    Little, Ambrose Finlandachel Morgan, MD 06/26/18 [phone]

## 2018-10-19 ENCOUNTER — Encounter (HOSPITAL_BASED_OUTPATIENT_CLINIC_OR_DEPARTMENT_OTHER): Payer: Self-pay | Admitting: *Deleted

## 2018-10-19 ENCOUNTER — Emergency Department (HOSPITAL_BASED_OUTPATIENT_CLINIC_OR_DEPARTMENT_OTHER)
Admission: EM | Admit: 2018-10-19 | Discharge: 2018-10-19 | Disposition: A | Discharge status: Home / Self Care | Payer: Medicaid Other | Attending: Emergency Medicine | Admitting: Emergency Medicine

## 2018-10-19 ENCOUNTER — Other Ambulatory Visit: Payer: Self-pay

## 2018-10-19 DIAGNOSIS — Z7984 Long term (current) use of oral hypoglycemic drugs: Secondary | ICD-10-CM | POA: Insufficient documentation

## 2018-10-19 DIAGNOSIS — E119 Type 2 diabetes mellitus without complications: Secondary | ICD-10-CM | POA: Diagnosis not present

## 2018-10-19 DIAGNOSIS — I10 Essential (primary) hypertension: Secondary | ICD-10-CM | POA: Diagnosis not present

## 2018-10-19 DIAGNOSIS — Z79899 Other long term (current) drug therapy: Secondary | ICD-10-CM | POA: Diagnosis not present

## 2018-10-19 DIAGNOSIS — Z7982 Long term (current) use of aspirin: Secondary | ICD-10-CM | POA: Insufficient documentation

## 2018-10-19 DIAGNOSIS — F17210 Nicotine dependence, cigarettes, uncomplicated: Secondary | ICD-10-CM | POA: Diagnosis not present

## 2018-10-19 NOTE — ED Provider Notes (Signed)
[facility] EMERGENCY DEPARTMENT Provider Note   CSN: [id number] Arrival date & time: 10/19/18  2128    History   Chief Complaint Chief Complaint  Patient presents with   Hypertension    HPI Michelle Houston is a 38 y.o. female with history of tobacco abuse, diabetes mellitus, and chronic hypertension who presents to the emergency department with concern regarding her blood pressure. Patient states that she checked her blood pressure this evening and noted it to be elevated 150s-170s/90s/100s and this concerned her. She states her BP is typically 110-120s/80s when she visits doctors office. She is completely asymptomatic, denies headache, change in vision, dizziness, numbness, weakness, chest pain, or dyspnea. No alleviating/aggravating factors to her BP. She has been on  of daily Amlodipine for 2 years now w/ no recent med changes, has not missed any doses. Given the elevation she wanted to get checked out.      HPI  Past Medical History:  Diagnosis Date   Bronchitis    Diabetes mellitus    type 2 chronic   Hypertension    Obesity    PCOS (polycystic ovarian syndrome)    Pneumonia     Patient Active Problem List   Diagnosis Date Noted   Impingement syndrome of right shoulder 12/09/2017   Impingement syndrome of left shoulder 12/09/2017   SVD (spontaneous vaginal delivery) 04/18/2017   At risk for alteration in fetal status 04/16/2017   Chronic hypertension during pregnancy, antepartum 11/14/2016   Diabetes mellitus affecting pregnancy, antepartum 10/17/2016   Low vitamin D level 09/30/2016   Abnormal Pap smear of cervix 09/30/2016   AMA (advanced maternal age) multigravida 35+ 09/30/2016   DM (diabetes mellitus) (HCC) 09/19/2016   S/P cholecystectomy 09/19/2016   History of bladder repair surgery 09/19/2016   H/O carpal tunnel repair 09/19/2016   Obese 09/19/2016   Supervision of high risk pregnancy, antepartum 10/13/2013    Chronic hypertension 10/13/2013    Past Surgical History:  Procedure Laterality Date   BLADDER REPAIR     CARPAL TUNNEL RELEASE     CHOLECYSTECTOMY       OB History    Gravida  5   Para  4   Term  4   Preterm      AB  1   Living  4     SAB  1   TAB      Ectopic      Multiple  0   Live Births  4            Home Medications    Prior to Admission medications   Medication Sig Start Date End Date Taking? Authorizing Provider  ACCU-CHEK FASTCLIX LANCETS MISC 1 application by Does not apply route 4 (four) times daily -  with meals and at bedtime. 01/09/17   Orvilla Cornwall A, CNM  acetaminophen (TYLENOL) 500 MG tablet Take 500 mg by mouth every 6 (six) hours as needed for moderate pain or headache.     [provider]  albuterol (PROVENTIL HFA;VENTOLIN HFA) 108 (90 Base) MCG/ACT inhaler Inhale 2 puffs into the lungs every 6 (six) hours as needed for wheezing or shortness of breath. 06/25/18   Jeannie Fend, PA-C  amLODipine (NORVASC) 10 MG tablet Take 1 tablet (10 mg total) by mouth daily. 04/18/17   Degele, Kandra Nicolas, MD  aspirin 81 MG chewable tablet Chew 1 tablet (81 mg total) by mouth daily. 12/12/16   Hermina Staggers, MD  benzonatate (TESSALON)  200 MG capsule Take 1 capsule (200 mg total) by mouth 3 (three) times daily as needed for cough. 06/19/18   Gilda Crease, MD  fluticasone (FLONASE) 50 MCG/ACT nasal spray Place 1 spray into both nostrils daily. 06/25/18   Jeannie Fend, PA-C  glucose blood (ACCU-CHEK GUIDE) test strip Use as instructed 01/09/17   Orvilla Cornwall A, CNM  ibuprofen (ADVIL,MOTRIN) 600 MG tablet Take 1 tablet (600 mg total) by mouth every 6 (six) hours. 04/18/17   Degele, Kandra Nicolas, MD  Lancet Devices MISC 1 Device by Does not apply route 4 (four) times daily. 11/14/16   Hermina Staggers, MD  metFORMIN (GLUCOPHAGE) 500 MG tablet Take 1 tablet (500 mg total) by mouth 2 (two) times daily with a meal. 04/18/17   Degele, Kandra Nicolas, MD    methocarbamol (ROBAXIN) 750 MG tablet Take 1-2 tablets (750-1,500 mg total) by mouth 3 (three) times daily as needed for muscle spasms. 12/14/17   Cristina Gong, PA-C  omeprazole (PRILOSEC) 20 MG capsule Take 1 capsule (20 mg total) by mouth 2 (two) times daily before a meal. 02/06/17   Brock Bad, MD  ondansetron (ZOFRAN-ODT) 4 MG disintegrating tablet Take 1 tablet (4 mg total) by mouth every 8 (eight) hours as needed for nausea or vomiting. Patient not taking: Reported on 04/09/2017 03/13/17   Pincus Large, DO  oseltamivir (TAMIFLU) 75 MG capsule Take 1 capsule (75 mg total) by mouth every 12 (twelve) hours. 06/19/18   Gilda Crease, MD  Prenat-FeAsp-Meth-FA-DHA w/o A (PRENATE PIXIE) 10-0.6-0.4-200 MG CAPS Take 1 tablet by mouth daily. 09/19/16   Orvilla Cornwall A, CNM  triamcinolone cream (KENALOG) 0.1 % Apply 1 application topically 2 (two) times daily. 02/28/18   Wurst, [place], PA-C  Vitamin D, Ergocalciferol, (DRISDOL) 50000 units CAPS capsule Take 1 capsule (50,000 Units total) by mouth every 7 (seven) days. 09/30/16   Roe Coombs, CNM    Family History Family History  Problem Relation Age of Onset   Cancer Maternal Grandmother    Hypertension Mother    Fibroids Mother    Diabetes Paternal Aunt    Stroke Maternal Grandfather    Diabetes Paternal Grandmother    Stroke Paternal Grandmother    Cancer Father        lung   Heart disease Paternal Grandfather     Social History Social History   Tobacco Use   Smoking status: Current Every Day Smoker    Packs/day: 1.00    Years: 18.00    Pack years: 18.00    Types: Cigarettes   Smokeless tobacco: Never Used   Tobacco comment: 6 day  Substance Use Topics   Alcohol use: No    Alcohol/week: 0.0 standard drinks   Drug use: No     Allergies   Patient has no known allergies.   Review of Systems Review of Systems  Constitutional: Negative for chills and fever.  Eyes: Negative for  visual disturbance.  Respiratory: Negative for shortness of breath.   Cardiovascular: Negative for chest pain.  Neurological: Negative for dizziness, tremors, seizures, syncope, facial asymmetry, speech difficulty, weakness, light-headedness, numbness and headaches.     Physical Exam Updated Vital Signs BP (!) 157/92 (BP Location: Right Arm)    Pulse 91    Temp 98.3 F (36.8 C) (Oral)    Resp 18    Ht 5' 1.5" (1.562 m)    Wt 108.9 kg    LMP 10/09/2018    SpO2  100%    Breastfeeding No    BMI 44.61 kg/m   Physical Exam Vitals signs and nursing note reviewed.  Constitutional:      General: She is not in acute distress.    Appearance: She is well-developed. She is not toxic-appearing.  HENT:     Head: Normocephalic and atraumatic.  Eyes:     General:        Right eye: No discharge.        Left eye: No discharge.     Extraocular Movements: Extraocular movements intact.     Conjunctiva/sclera: Conjunctivae normal.     Pupils: Pupils are equal, round, and reactive to light.  Neck:     Musculoskeletal: Neck supple.  Cardiovascular:     Rate and Rhythm: Normal rate and regular rhythm.  Pulmonary:     Effort: Pulmonary effort is normal. No respiratory distress.     Breath sounds: Normal breath sounds. No wheezing, rhonchi or rales.  Abdominal:     General: There is no distension.     Palpations: Abdomen is soft.     Tenderness: There is no abdominal tenderness.  Skin:    General: Skin is warm and dry.     Findings: No rash.  Neurological:     Mental Status: She is alert.     Comments:  Alert. Clear speech. No facial droop. CNIII-XII grossly intact. Bilateral upper and lower extremities' sensation grossly intact. 5/5 symmetric strength with grip strength and with plantar and dorsi flexion bilaterally. . Normal finger to nose bilaterally. Gait is steady and intact.    Psychiatric:        Behavior: Behavior normal.    ED Treatments / Results  Labs (all labs ordered are listed,  but only abnormal results are displayed) Labs Reviewed - No data to display  EKG None  Radiology No results found.  Procedures Procedures (including critical care time)  Medications Ordered in ED Medications - No data to display   Initial Impression / Assessment and Plan / ED Course  I have reviewed the triage vital signs and the nursing notes.  Pertinent labs & imaging results that were available during my care of the patient were reviewed by me and considered in my medical decision making (see chart for details).  Patient presents to the ED w/ concerns for elevated blood pressure. Hx of HTN on Amlodipine, no recent missed doses or medication changes. Her blood pressure elevated in the emergency department today 130s-150s/90s.   Patient denies headache, change in vision, numbness, weakness, chest pain, dyspnea, dizziness, or lightheadedness, she has no focal neurologic deficits.  Presentation does not appear consistent w/  hypertensive emergency. Discussed elevated blood pressure with the patient, instructed she keep a blood pressure log/diary at home, and the need for primary care follow up to discuss further monitoring/management. Discussed tx plan, need for follow up, & return precaution signs/symptoms for hypertensive emergency as listed above with the patient. Provided opportunity for questions, patient confirmed understanding and is in agreement with plan. Discharged from ED in good condition.    Vitals:   10/19/18 2135 10/19/18 2147  BP: (!) 157/92 (!) 135/94  Pulse: 91   Resp: 18   Temp: 98.3 F (36.8 C)   SpO2: 100%     Final Clinical Impressions(s) / ED Diagnoses   Final diagnoses:  Hypertension, unspecified type    ED Discharge Orders    None       Cherly Andersonetrucelli, Isiaha [place] R, PA-C 10/19/18 2213  Virgina Norfolk, DO 10/19/18 2256

## 2018-10-19 NOTE — Discharge Instructions (Signed)
Please continue your blood pressure medicines at home.  Be sure to keep a blood pressure diary with recordings. Call your primary care doctor tomorrow morning to discuss your blood pressure medication management.  Return to the ER for new or worsening symptoms including but not limited to chest pain, trouble breathing, numbness, weakness, headache, passing out, dizziness, vision change, or any other concerns.

## 2018-10-19 NOTE — ED Triage Notes (Signed)
Pt voices concern bc her BP has been elevated tonight (170/103) despite taking her BP meds. She denies pain or other Sx

## 2018-10-19 NOTE — ED Notes (Signed)
Pt concerned for HTN- had a headache and checked it at home with reading of 140/100. Pt instructed to continue BP medication and follow up with primary care. Pt voiced understanding.

## 2018-10-31 ENCOUNTER — Encounter (HOSPITAL_COMMUNITY): Payer: Self-pay

## 2018-10-31 ENCOUNTER — Ambulatory Visit (HOSPITAL_COMMUNITY)
Admission: EM | Admit: 2018-10-31 | Discharge: 2018-10-31 | Disposition: A | Discharge status: Home / Self Care | Payer: Medicaid Other | Attending: Family Medicine | Admitting: Family Medicine

## 2018-10-31 ENCOUNTER — Other Ambulatory Visit: Payer: Self-pay

## 2018-10-31 DIAGNOSIS — Z7251 High risk heterosexual behavior: Secondary | ICD-10-CM | POA: Diagnosis present

## 2018-10-31 DIAGNOSIS — N760 Acute vaginitis: Secondary | ICD-10-CM | POA: Diagnosis present

## 2018-10-31 LAB — POCT URINALYSIS DIP (DEVICE)
Bilirubin Urine: NEGATIVE
Glucose, UA: NEGATIVE mg/dL
Hgb urine dipstick: NEGATIVE
Ketones, ur: NEGATIVE mg/dL
Leukocytes,Ua: NEGATIVE
Nitrite: NEGATIVE
Protein, ur: NEGATIVE mg/dL
Specific Gravity, Urine: 1.025 (ref 1.005–1.030)
Urobilinogen, UA: 0.2 mg/dL (ref 0.0–1.0)
pH: 5.5 (ref 5.0–8.0)

## 2018-10-31 LAB — POCT PREGNANCY, URINE: Preg Test, Ur: NEGATIVE

## 2018-10-31 MED ORDER — FLUCONAZOLE 150 MG PO TABS: 150.0000 mg | ORAL_TABLET | Freq: Every day | ORAL | 0 refills | Status: DC

## 2018-10-31 NOTE — ED Triage Notes (Signed)
Patient presents to Urgent Care with complaints of vaginal itching and discharge since 2 days ago. Patient reports she got Monostat and it has not helped yet. Pt states she has been having unprotected sex and is also potentially worried about STDs.

## 2018-10-31 NOTE — ED Provider Notes (Signed)
[facility]    CSN: [id number] Arrival date & time: 10/31/18  1057      History   Chief Complaint Chief Complaint  Patient presents with  . Vaginitis    HPI Michelle Houston is a 38 y.o. female.   HPI  Patient states she has a thick vaginal discharge and itching.  This is been going on for 2 days.  She tried a Monistat yesterday but she feels worse today.  She has no odor.  She has been in a stable relationship for many years.  Does not think she is exposed to STD, however, she does want STD testing as long as she is here.  No abdominal pain.  No fever chills.  No urinary complaints  Past Medical History:  Diagnosis Date  . Bronchitis   . Diabetes mellitus    type 2 chronic  . Hypertension   . Obesity   . PCOS (polycystic ovarian syndrome)   . Pneumonia     Patient Active Problem List   Diagnosis Date Noted  . Impingement syndrome of right shoulder 12/09/2017  . Impingement syndrome of left shoulder 12/09/2017  . SVD (spontaneous vaginal delivery) 04/18/2017  . At risk for alteration in fetal status 04/16/2017  . Chronic hypertension during pregnancy, antepartum 11/14/2016  . Diabetes mellitus affecting pregnancy, antepartum 10/17/2016  . Low vitamin D level 09/30/2016  . Abnormal Pap smear of cervix 09/30/2016  . AMA (advanced maternal age) multigravida 35+ 09/30/2016  . DM (diabetes mellitus) (HCC) 09/19/2016  . S/P cholecystectomy 09/19/2016  . History of bladder repair surgery 09/19/2016  . H/O carpal tunnel repair 09/19/2016  . Obese 09/19/2016  . Supervision of high risk pregnancy, antepartum 10/13/2013  . Chronic hypertension 10/13/2013    Past Surgical History:  Procedure Laterality Date  . BLADDER REPAIR    . CARPAL TUNNEL RELEASE    . CHOLECYSTECTOMY      OB History    Gravida  5   Para  4   Term  4   Preterm      AB  1   Living  4     SAB  1   TAB      Ectopic      Multiple  0   Live Births  4             Home Medications    Prior to Admission medications   Medication Sig Start Date End Date Taking? Authorizing Provider  amLODipine (NORVASC) 10 MG tablet Take 1 tablet (10 mg total) by mouth daily. 04/18/17  Yes Degele, Kandra NicolasJulie P, MD  lisinopril (ZESTRIL) 10 MG tablet Take 10 mg by mouth daily.   Yes [provider]  metFORMIN (GLUCOPHAGE) 500 MG tablet Take 1 tablet (500 mg total) by mouth 2 (two) times daily with a meal. 04/18/17  Yes Degele, Kandra NicolasJulie P, MD  ACCU-CHEK FASTCLIX LANCETS MISC 1 application by Does not apply route 4 (four) times daily -  with meals and at bedtime. 01/09/17   Orvilla Cornwallenney, Rachelle A, CNM  acetaminophen (TYLENOL) 500 MG tablet Take 500 mg by mouth every 6 (six) hours as needed for moderate pain or headache.     [provider]  albuterol (PROVENTIL HFA;VENTOLIN HFA) 108 (90 Base) MCG/ACT inhaler Inhale 2 puffs into the lungs every 6 (six) hours as needed for wheezing or shortness of breath. 06/25/18   Jeannie FendMurphy, Laura A, PA-C  aspirin 81 MG chewable tablet Chew 1 tablet (81 mg total)  by mouth daily. 12/12/16   Hermina StaggersErvin, Michael L, MD  fluconazole (DIFLUCAN) 150 MG tablet Take 1 tablet (150 mg total) by mouth daily. Repeat in 1 week if needed 10/31/18   Eustace MooreNelson, Yvonne Sue, MD  fluticasone [facility](FLONASE) 50 MCG/ACT nasal spray Place 1 spray into both nostrils daily. 06/25/18   Jeannie FendMurphy, Laura A, PA-C  glucose blood (ACCU-CHEK GUIDE) test strip Use as instructed 01/09/17   Orvilla Cornwallenney, Rachelle A, CNM  ibuprofen (ADVIL,MOTRIN) 600 MG tablet Take 1 tablet (600 mg total) by mouth every 6 (six) hours. 04/18/17   Degele, Kandra NicolasJulie P, MD  Lancet Devices MISC 1 Device by Does not apply route 4 (four) times daily. 11/14/16   Hermina StaggersErvin, Michael L, MD  Vitamin D, Ergocalciferol, (DRISDOL) 50000 units CAPS capsule Take 1 capsule (50,000 Units total) by mouth every 7 (seven) days. 09/30/16   Roe Coombsenney, Rachelle A, CNM  omeprazole (PRILOSEC) 20 MG capsule Take 1 capsule (20 mg total) by mouth 2 (two) times daily  before a meal. 02/06/17 10/31/18  Brock BadHarper, Charles A, MD    Family History Family History  Problem Relation Age of Onset  . Cancer Maternal Grandmother   . Hypertension Mother   . Fibroids Mother   . Diabetes Paternal Aunt   . Stroke Maternal Grandfather   . Diabetes Paternal Grandmother   . Stroke Paternal Grandmother   . Cancer Father        lung  . Heart disease Paternal Grandfather     Social History Social History   Tobacco Use  . Smoking status: Current Every Day Smoker    Packs/day: 1.00    Years: 18.00    Pack years: 18.00    Types: Cigarettes  . Smokeless tobacco: Never Used  . Tobacco comment: 6 day  Substance Use Topics  . Alcohol use: No    Alcohol/week: 0.0 standard drinks  . Drug use: No     Allergies   Patient has no known allergies.   Review of Systems Review of Systems  Constitutional: Negative for chills and fever.  HENT: Negative for ear pain and sore throat.   Eyes: Negative for pain and visual disturbance.  Respiratory: Negative for cough and shortness of breath.   Cardiovascular: Negative for chest pain and palpitations.  Gastrointestinal: Negative for abdominal pain and vomiting.  Genitourinary: Positive for vaginal discharge. Negative for dysuria and hematuria.  Musculoskeletal: Negative for arthralgias and back pain.  Skin: Negative for color change and rash.  Neurological: Negative for seizures and syncope.  All other systems reviewed and are negative.    Physical Exam Triage Vital Signs ED Triage Vitals  Enc Vitals Group     BP 10/31/18 1123 125/79     Pulse Rate 10/31/18 1123 (!) 113     Resp 10/31/18 1123 18     Temp 10/31/18 1123 98.5 F (36.9 C)     Temp Source 10/31/18 1123 Oral     SpO2 10/31/18 1123 98 %     Weight --      Height --      Head Circumference --      Peak Flow --      Pain Score 10/31/18 1130 0     Pain Loc --      Pain Edu? --      Excl. in GC? --    No data found.  Updated Vital Signs BP  125/79 (BP Location: Right Arm)   Pulse (!) 113   Temp 98.5 F (36.9 C) (  Oral)   Resp 18   LMP 10/09/2018 (Exact Date)   SpO2 98%   Physical Exam Constitutional:      General: She is not in acute distress.    Appearance: She is well-developed. She is obese.  HENT:     Head: Normocephalic and atraumatic.  Eyes:     Conjunctiva/sclera: Conjunctivae normal.     Pupils: Pupils are equal, round, and reactive to light.  Neck:     Musculoskeletal: Normal range of motion.  Cardiovascular:     Rate and Rhythm: Normal rate.  Pulmonary:     Effort: Pulmonary effort is normal. No respiratory distress.  Abdominal:     General: There is no distension.     Palpations: Abdomen is soft.  Musculoskeletal: Normal range of motion.  Skin:    General: Skin is warm and dry.  Neurological:     Mental Status: She is alert.      UC Treatments / Results  Labs (all labs ordered are listed, but only abnormal results are displayed) Labs Reviewed  POC URINE PREG, ED  POCT URINALYSIS DIP (DEVICE)  POCT PREGNANCY, URINE  CERVICOVAGINAL ANCILLARY ONLY    EKG None  Radiology No results found.  Procedures Procedures (including critical care time)  Medications Ordered in UC Medications - No data to display  Initial Impression / Assessment and Plan / UC Course  I have reviewed the triage vital signs and the nursing notes.  Pertinent labs & imaging results that were available during my care of the patient were reviewed by me and considered in my medical decision making (see chart for details).      Final Clinical Impressions(s) / UC Diagnoses   Final diagnoses:  Vaginitis and vulvovaginitis  Unprotected sex     Discharge Instructions     Take the diflucan as directed We did lab testing during this visit.  If there are any abnormal findings that require change in medicine or indicate a positive result, you will be notified.  If all of your tests are normal, you will not be called.     Pregnancy test negative   ED Prescriptions    Medication Sig Dispense Auth. Provider   fluconazole (DIFLUCAN) 150 MG tablet Take 1 tablet (150 mg total) by mouth daily. Repeat in 1 week if needed 2 tablet Raylene Everts, MD     Controlled Substance Prescriptions [place] Controlled Substance Registry consulted? Not Applicable   Raylene Everts, MD 10/31/18 1723

## 2018-10-31 NOTE — Discharge Instructions (Addendum)
Take the diflucan as directed We did lab testing during this visit.  If there are any abnormal findings that require change in medicine or indicate a positive result, you will be notified.  If all of your tests are normal, you will not be called.    Pregnancy test negative

## 2018-11-03 LAB — CERVICOVAGINAL ANCILLARY ONLY
Chlamydia: NEGATIVE
Neisseria Gonorrhea: NEGATIVE
Trichomonas: NEGATIVE

## 2019-01-01 ENCOUNTER — Other Ambulatory Visit: Payer: Self-pay

## 2019-01-01 ENCOUNTER — Other Ambulatory Visit (HOSPITAL_COMMUNITY)
Admission: RE | Admit: 2019-01-01 | Discharge: 2019-01-01 | Disposition: A | Discharge status: Home / Self Care | Payer: Medicaid Other | Source: Ambulatory Visit | Attending: Obstetrics and Gynecology | Admitting: Obstetrics and Gynecology

## 2019-01-01 ENCOUNTER — Ambulatory Visit: Payer: Medicaid Other

## 2019-01-01 DIAGNOSIS — N898 Other specified noninflammatory disorders of vagina: Secondary | ICD-10-CM

## 2019-01-01 NOTE — Progress Notes (Signed)
Pt is in the office for self swab, complains of discharge and irritation. Pt wants to be tested for yeast and bv.

## 2019-01-02 LAB — CERVICOVAGINAL ANCILLARY ONLY
Bacterial vaginitis: NEGATIVE
Candida vaginitis: NEGATIVE

## 2019-01-28 ENCOUNTER — Ambulatory Visit: Payer: Medicaid Other | Admitting: Family Medicine

## 2019-02-10 ENCOUNTER — Ambulatory Visit: Payer: Medicaid Other | Admitting: Obstetrics & Gynecology

## 2019-03-06 ENCOUNTER — Other Ambulatory Visit: Payer: Self-pay

## 2019-03-06 ENCOUNTER — Other Ambulatory Visit (HOSPITAL_COMMUNITY)
Admission: RE | Admit: 2019-03-06 | Discharge: 2019-03-06 | Disposition: A | Discharge status: Home / Self Care | Payer: Medicaid Other | Source: Ambulatory Visit | Attending: Family Medicine | Admitting: Family Medicine

## 2019-03-06 ENCOUNTER — Ambulatory Visit: Payer: Medicaid Other | Admitting: Nurse Practitioner

## 2019-03-06 ENCOUNTER — Encounter: Payer: Self-pay | Admitting: Nurse Practitioner

## 2019-03-06 VITALS — BP 125/85 | HR 85 | Wt 225.0 lb

## 2019-03-06 DIAGNOSIS — Z01419 Encounter for gynecological examination (general) (routine) without abnormal findings: Secondary | ICD-10-CM | POA: Diagnosis not present

## 2019-03-06 DIAGNOSIS — O10919 Unspecified pre-existing hypertension complicating pregnancy, unspecified trimester: Secondary | ICD-10-CM

## 2019-03-06 DIAGNOSIS — O24919 Unspecified diabetes mellitus in pregnancy, unspecified trimester: Secondary | ICD-10-CM

## 2019-03-06 DIAGNOSIS — Z0000 Encounter for general adult medical examination without abnormal findings: Secondary | ICD-10-CM | POA: Diagnosis not present

## 2019-03-06 NOTE — Progress Notes (Signed)
   GYNECOLOGY OFFICE VISIT NOTE   History:  38 y.o. [id number] here today for GYN exam.  Had annual exam at her PCP earlier this week. Uses natural family planning consistently and has not become pregnant in one year.  Happy with that method.  Is working on weight loss and has lost 15 pounds with exercise.  Taking medications consistently for diabetes and hypertension.  These are managed by her PCP.  Is up to date on flu.  She denies any abnormal vaginal discharge, bleeding, pelvic pain or other concerns.   Past Medical History:  Diagnosis Date  . Bronchitis   . Diabetes mellitus    type 2 chronic  . Hypertension   . Obesity   . PCOS (polycystic ovarian syndrome)   . Pneumonia     Past Surgical History:  Procedure Laterality Date  . BLADDER REPAIR    . CARPAL TUNNEL RELEASE    . CHOLECYSTECTOMY      The following portions of the patient's history were reviewed and updated as appropriate: allergies, current medications, past family history, past medical history, past social history, past surgical history and problem list.   Health Maintenance:  Abnormal pap on 09-25-16. Mammogram due at age 30.   Review of Systems:  Pertinent items noted in HPI and remainder of comprehensive ROS otherwise negative.  Objective:  Physical Exam BP 125/85   Pulse 85   Wt 225 lb (102.1 kg)   LMP 02/10/2019   BMI 41.82 kg/m  CONSTITUTIONAL: Well-developed, well-nourished female in no acute distress.  HENT:  Normocephalic, atraumatic. External right and left ear normal.  EYES: Conjunctivae and EOM are normal. Pupils are equal, round.  No scleral icterus.  NECK: Normal range of motion, supple, no masses SKIN: Skin is warm and dry. No rash noted. Not diaphoretic. No erythema. No pallor. NEUROLOGIC: Alert and oriented to person, place, and time. Normal muscle tone coordination. No cranial nerve deficit noted. PSYCHIATRIC: Normal mood and affect. Normal behavior. Normal judgment and thought content.  CARDIOVASCULAR: Normal heart rate noted RESPIRATORY: Effort and breath sounds normal, no problems with respiration noted ABDOMEN: Soft, no distention noted.   PELVIC: Normal appearing external genitalia; normal appearing vaginal mucosa and cervix.  No abnormal discharge noted.  Normal uterine size, no other palpable masses, no uterine or adnexal tenderness. OF NOTE - cervix is quite red and firm.  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  MUSCULOSKELETAL: Normal range of motion. No edema noted.  Labs and Imaging No results found.  Assessment & Plan:  1. Gynecologic exam  Noted last pap in 2018 was not normal.  Will watch results carefully.  2.  Vaginal itching  Has had itching and has treated for yeast and intching has not fully resolved.    Routine preventative health maintenance measures emphasized. Please refer to After Visit Summary for other counseling recommendations.   Return in about 1 year (around 03/05/2020).   Total face-to-face time with patient: 15 minutes.  Over 50% of encounter was spent on counseling and coordination of care.  Earlie Server, RN, MSN, NP-BC Nurse Practitioner, [facility] for [employer], [place] [facility] 03/06/2019 11:04 AM

## 2019-03-11 LAB — CERVICOVAGINAL ANCILLARY ONLY
Bacterial Vaginitis (gardnerella): NEGATIVE
Candida Glabrata: NEGATIVE
Candida Vaginitis: NEGATIVE
Chlamydia: NEGATIVE
Comment: NEGATIVE
Comment: NEGATIVE
Comment: NEGATIVE
Comment: NEGATIVE
Comment: NEGATIVE
Comment: NORMAL
Neisseria Gonorrhea: NEGATIVE
Trichomonas: NEGATIVE

## 2019-03-12 LAB — CYTOLOGY - PAP
Comment: NEGATIVE
Diagnosis: NEGATIVE
High risk HPV: NEGATIVE

## 2019-04-27 ENCOUNTER — Ambulatory Visit: Payer: Medicaid Other | Admitting: Orthopaedic Surgery

## 2019-04-28 NOTE — Progress Notes (Signed)
Patient ID: Michelle Houston, female   DOB: 1981-02-22, 38 y.o.   MRN: [id number] Patient seen and assessed by nursing staff during this encounter. I have reviewed the chart and agree with the documentation and plan.  Emeterio Reeve, MD 04/28/2019 3:48 PM

## 2019-05-05 ENCOUNTER — Ambulatory Visit (HOSPITAL_COMMUNITY)
Admission: EM | Admit: 2019-05-05 | Discharge: 2019-05-05 | Disposition: A | Discharge status: Home / Self Care | Payer: Medicaid Other | Attending: Family Medicine | Admitting: Family Medicine

## 2019-05-05 ENCOUNTER — Other Ambulatory Visit: Payer: Self-pay

## 2019-05-05 ENCOUNTER — Encounter (HOSPITAL_COMMUNITY): Payer: Self-pay

## 2019-05-05 DIAGNOSIS — R05 Cough: Secondary | ICD-10-CM | POA: Diagnosis not present

## 2019-05-05 DIAGNOSIS — J069 Acute upper respiratory infection, unspecified: Secondary | ICD-10-CM | POA: Diagnosis not present

## 2019-05-05 MED ORDER — BENZONATATE 100 MG PO CAPS: 100.0000 mg | ORAL_CAPSULE | Freq: Three times a day (TID) | ORAL | 0 refills | Status: DC

## 2019-05-05 NOTE — Discharge Instructions (Signed)
Take the cough medication as prescribed.  You can take 1 to 2 capsules every 8 hours as needed Keep using your nebulizer treatments at home Follow up as needed for continued or worsening symptoms

## 2019-05-05 NOTE — ED Triage Notes (Signed)
Pt present coughing and wheezing, symptoms started 2 days ago. Pt was recently tested for covid on Sunday through her job. Her result was negative

## 2019-05-06 NOTE — ED Provider Notes (Signed)
[facility]    CSN: [id number] Arrival date & time: 05/05/19  [id number]      History   Chief Complaint Chief Complaint  Patient presents with  . Cough  . Wheezing    HPI Michelle Houston is a 38 y.o. female.   Patient is a 38 year old female with past medical history of bronchitis, diabetes, hypertension, obesity, PCOS, pneumonia.  She presents today with approximately 2 days of coughing and wheezing.  Recently tested for Covid at her job which was negative.  Her symptoms have been constant.  She has been using her nebulizer machine with some relief.  Is requesting something for cough.  Over-the-counter cough medication is not working.  Cough is dry. Denies any chest pain, SOB.   ROS per HPI    Cough Associated symptoms: wheezing   Wheezing Associated symptoms: cough     Past Medical History:  Diagnosis Date  . Bronchitis   . Diabetes mellitus    type 2 chronic  . Hypertension   . Obesity   . PCOS (polycystic ovarian syndrome)   . Pneumonia     Patient Active Problem List   Diagnosis Date Noted  . Impingement syndrome of right shoulder 12/09/2017  . Impingement syndrome of left shoulder 12/09/2017  . SVD (spontaneous vaginal delivery) 04/18/2017  . At risk for alteration in fetal status 04/16/2017  . Chronic hypertension during pregnancy, antepartum 11/14/2016  . Diabetes mellitus affecting pregnancy, antepartum 10/17/2016  . Low vitamin D level 09/30/2016  . Abnormal Pap smear of cervix 09/30/2016  . AMA (advanced maternal age) multigravida 35+ 09/30/2016  . DM (diabetes mellitus) (HCC) 09/19/2016  . S/P cholecystectomy 09/19/2016  . History of bladder repair surgery 09/19/2016  . H/O carpal tunnel repair 09/19/2016  . Obese 09/19/2016  . Supervision of high risk pregnancy, antepartum 10/13/2013  . Chronic hypertension 10/13/2013    Past Surgical History:  Procedure Laterality Date  . BLADDER REPAIR    . CARPAL TUNNEL RELEASE    .  CHOLECYSTECTOMY      OB History    Gravida  5   Para  4   Term  4   Preterm      AB  1   Living  4     SAB  1   TAB      Ectopic      Multiple  0   Live Births  4            Home Medications    Prior to Admission medications   Medication Sig Start Date End Date Taking? Authorizing Provider  acetaminophen (TYLENOL) 500 MG tablet Take 500 mg by mouth every 6 (six) hours as needed for moderate pain or headache.     [provider]  amLODipine (NORVASC) 10 MG tablet Take 1 tablet (10 mg total) by mouth daily. 04/18/17   Degele, Kandra NicolasJulie P, MD  benzonatate (TESSALON) 100 MG capsule Take 1 capsule (100 mg total) by mouth every 8 (eight) hours. 05/05/19   Dail Meece, Gloris Manchesterraci A, NP  lisinopril (ZESTRIL) 10 MG tablet Take 10 mg by mouth daily.    [provider]  Vitamin D, Ergocalciferol, (DRISDOL) 50000 units CAPS capsule Take 1 capsule (50,000 Units total) by mouth every 7 (seven) days. 09/30/16   Orvilla Cornwallenney, Rachelle A, CNM  albuterol (PROVENTIL HFA;VENTOLIN HFA) 108 (90 Base) MCG/ACT inhaler Inhale 2 puffs into the lungs every 6 (six) hours as needed for wheezing or shortness of breath. Patient not taking:  Reported on 01/01/2019 06/25/18 05/05/19  Jeannie Fend, PA-C  fluticasone (FLONASE) 50 MCG/ACT nasal spray Place 1 spray into both nostrils daily. Patient not taking: Reported on 01/01/2019 06/25/18 05/05/19  Jeannie Fend, PA-C  metFORMIN (GLUCOPHAGE) 500 MG tablet Take 1 tablet (500 mg total) by mouth 2 (two) times daily with a meal. Patient not taking: Reported on 01/01/2019 04/18/17 05/05/19  Degele, Kandra Nicolas, MD  omeprazole (PRILOSEC) 20 MG capsule Take 1 capsule (20 mg total) by mouth 2 (two) times daily before a meal. 02/06/17 10/31/18  Brock Bad, MD    Family History Family History  Problem Relation Age of Onset  . Cancer Maternal Grandmother   . Hypertension Mother   . Fibroids Mother   . Diabetes Paternal Aunt   . Stroke Maternal Grandfather   .  Diabetes Paternal Grandmother   . Stroke Paternal Grandmother   . Cancer Father        lung  . Heart disease Paternal Grandfather     Social History Social History   Tobacco Use  . Smoking status: Current Every Day Smoker    Packs/day: 1.00    Years: 18.00    Pack years: 18.00    Types: Cigarettes  . Smokeless tobacco: Never Used  . Tobacco comment: 6 day  Substance Use Topics  . Alcohol use: No    Alcohol/week: 0.0 standard drinks  . Drug use: No     Allergies   Patient has no known allergies.   Review of Systems Review of Systems  Respiratory: Positive for cough and wheezing.      Physical Exam Triage Vital Signs ED Triage Vitals  Enc Vitals Group     BP 05/05/19 1043 140/87     Pulse Rate 05/05/19 1043 (!) 102     Resp 05/05/19 1043 18     Temp 05/05/19 1043 98.2 F (36.8 C)     Temp Source 05/05/19 1043 Oral     SpO2 05/05/19 1043 100 %     Weight --      Height --      Head Circumference --      Peak Flow --      Pain Score 05/05/19 1044 0     Pain Loc --      Pain Edu? --      Excl. in GC? --    No data found.  Updated Vital Signs BP 140/87 (BP Location: Right Arm)   Pulse (!) 102   Temp 98.2 F (36.8 C) (Oral)   Resp 18   SpO2 100%   Visual Acuity Right Eye Distance:   Left Eye Distance:   Bilateral Distance:    Right Eye Near:   Left Eye Near:    Bilateral Near:     Physical Exam Vitals and nursing note reviewed.  Constitutional:      General: She is not in acute distress.    Appearance: Normal appearance. She is not ill-appearing, toxic-appearing or diaphoretic.  HENT:     Head: Normocephalic.     Nose: Nose normal.     Mouth/Throat:     Pharynx: Oropharynx is clear.  Eyes:     Conjunctiva/sclera: Conjunctivae normal.  Cardiovascular:     Rate and Rhythm: Normal rate and regular rhythm.  Pulmonary:     Effort: Pulmonary effort is normal.     Breath sounds: Normal breath sounds.  Musculoskeletal:        General:  Normal range of motion.  Cervical back: Normal range of motion.  Skin:    General: Skin is warm and dry.     Findings: No rash.  Neurological:     Mental Status: She is alert.  Psychiatric:        Mood and Affect: Mood normal.      UC Treatments / Results  Labs (all labs ordered are listed, but only abnormal results are displayed) Labs Reviewed - No data to display  EKG   Radiology No results found.  Procedures Procedures (including critical care time)  Medications Ordered in UC Medications - No data to display  Initial Impression / Assessment and Plan / UC Course  I have reviewed the triage vital signs and the nursing notes.  Pertinent labs & imaging results that were available during my care of the patient were reviewed by me and considered in my medical decision making (see chart for details).     Viral URI-lungs clear on exam.  Will prescribe Tessalon Perles to use as needed for cough.  She can continue her nebulizer treatments at home as needed. Follow up as needed for continued or worsening symptoms  Final Clinical Impressions(s) / UC Diagnoses   Final diagnoses:  Viral URI with cough     Discharge Instructions     Take the cough medication as prescribed.  You can take 1 to 2 capsules every 8 hours as needed Keep using your nebulizer treatments at home Follow up as needed for continued or worsening symptoms     ED Prescriptions    Medication Sig Dispense Auth. Provider   benzonatate (TESSALON) 100 MG capsule Take 1 capsule (100 mg total) by mouth every 8 (eight) hours. 21 capsule Shyana Kulakowski A, NP     PDMP not reviewed this encounter.   Orvan July, NP 05/06/19 [phone]

## 2019-06-10 ENCOUNTER — Ambulatory Visit: Payer: Medicaid Other | Admitting: Physician Assistant

## 2019-06-17 ENCOUNTER — Ambulatory Visit: Payer: Medicaid Other | Admitting: Orthopaedic Surgery

## 2019-08-31 ENCOUNTER — Ambulatory Visit: Payer: Self-pay

## 2019-08-31 ENCOUNTER — Other Ambulatory Visit: Payer: Self-pay

## 2019-08-31 ENCOUNTER — Ambulatory Visit (INDEPENDENT_AMBULATORY_CARE_PROVIDER_SITE_OTHER): Payer: Medicaid Other

## 2019-08-31 ENCOUNTER — Ambulatory Visit (INDEPENDENT_AMBULATORY_CARE_PROVIDER_SITE_OTHER): Payer: Medicaid Other | Admitting: Orthopaedic Surgery

## 2019-08-31 ENCOUNTER — Encounter: Payer: Self-pay | Admitting: Orthopaedic Surgery

## 2019-08-31 DIAGNOSIS — M25512 Pain in left shoulder: Secondary | ICD-10-CM

## 2019-08-31 DIAGNOSIS — Z96611 Presence of right artificial shoulder joint: Secondary | ICD-10-CM

## 2019-08-31 DIAGNOSIS — G8929 Other chronic pain: Secondary | ICD-10-CM

## 2019-08-31 DIAGNOSIS — M25511 Pain in right shoulder: Secondary | ICD-10-CM | POA: Diagnosis not present

## 2019-08-31 MED ORDER — HYDROCODONE-ACETAMINOPHEN 5-325 MG PO TABS: 1.0000 | ORAL_TABLET | Freq: Four times a day (QID) | ORAL | 0 refills | Status: DC | PRN

## 2019-08-31 MED ORDER — TIZANIDINE HCL 4 MG PO TABS: 4.0000 mg | ORAL_TABLET | Freq: Three times a day (TID) | ORAL | 0 refills | Status: DC | PRN

## 2019-08-31 MED ORDER — MELOXICAM 15 MG PO TABS: 15.0000 mg | ORAL_TABLET | Freq: Every day | ORAL | 0 refills | Status: DC

## 2019-08-31 NOTE — Progress Notes (Signed)
Office Visit Note   Patient: Michelle Houston           Date of Birth: 1981-02-10           MRN: [id number] Visit Date: 08/31/2019              Requested by: Nolene Ebbs, MD [address] [place],  [place] [address] PCP: Nolene Ebbs, MD   Assessment & Plan: Visit Diagnoses:  1. Chronic left shoulder pain   2. Chronic right shoulder pain     Plan: At this point I am recommending a MRI of her right shoulder given the severity of her pain and the weakness on my exam findings.  Also given the fact that she has failed conservative treatment for well over 2 years now including activity modification, anti-inflammatories, multiple steroid injections and therapy.  I do feel it is clinically medically warranted at this point given the weakness I am seeing on clinical exam as well as the severity of her pain.  I will send in a combination of hydrocodone, meloxicam and Zanaflex for her.  Over this will help with the spasms in her neck as well.  We will work on scheduling her for an MRI of her right shoulder to rule out a rotator cuff tear or other pathology that it is causing her to have the severe pain.  All question concerns were answered and addressed.  I will see her back after the MRI.  Follow-Up Instructions: Return in about 2 weeks (around 09/14/2019).   Orders:  Orders Placed This Encounter  Procedures  . XR Shoulder Left  . XR Shoulder Right   Meds ordered this encounter  Medications  . HYDROcodone-acetaminophen (NORCO/VICODIN) 5-325 MG tablet    Sig: Take 1 tablet by mouth every 6 (six) hours as needed for moderate pain.    Dispense:  30 tablet    Refill:  0  . tiZANidine (ZANAFLEX) 4 MG tablet    Sig: Take 1 tablet (4 mg total) by mouth every 8 (eight) hours as needed for muscle spasms.    Dispense:  40 tablet    Refill:  0  . meloxicam (MOBIC) 15 MG tablet    Sig: Take 1 tablet (15 mg total) by mouth daily.    Dispense:  30 tablet    Refill:  0      Procedures:  No procedures performed   Clinical Data: No additional findings.   Subjective: Chief Complaint  Patient presents with  . Right Shoulder - Pain  . Left Shoulder - Pain  The patient is very well-known to me.  I have seen her multiple times over the years.  This is mainly been for bilateral shoulder pain.  We have injected the subacromial area of her shoulder multiple occasions on both shoulders.  She has now developed worsening right shoulder pain and weakness and some left shoulder pain as well.  She is a Secretary/administrator.  She cannot sleep due to the pain in her shoulders.  They hurt with overhead activity and she states there is much more weakness in her right dominant shoulder than she has had in the past.  She also reports right-sided neck pain.  It has become quite uncomfortable for her.  At this point she has tried and failed other conservative treatment measures for over 2 years now including activity modification, physical therapy, multiple steroid injections and anti-inflammatories.  She has had no other acute change in her medical status.  HPI  Review  of Systems She currently denies any headache, chest pain, shortness of breath, fever, chills, nausea, vomiting  Objective: Vital Signs: There were no vitals taken for this visit.  Physical Exam She is alert and oriented x3 and in no acute distress Ortho Exam Examination of her right shoulder shows severe evidence of impingement with positive Neer and Hawkins signs.  She is using more of her deltoid abduct her shoulder.  Her internal rotation with adduction is limited but her liftoff is negative.  There is weakness with external rotation of the right shoulder.  Her left shoulder also shows signs of impingement and some weakness but it is not as severe as the right side. Specialty Comments:  No specialty comments available.  Imaging: XR Shoulder Left  Result Date: 08/31/2019 3 views of the left shoulder show no acute findings.  There  is a small para-articular osteophyte off the inferior glenoid rim.  XR Shoulder Right  Result Date: 08/31/2019 3 views of the right shoulder show no acute findings.    PMFS History: Patient Active Problem List   Diagnosis Date Noted  . Impingement syndrome of right shoulder 12/09/2017  . Impingement syndrome of left shoulder 12/09/2017  . SVD (spontaneous vaginal delivery) 04/18/2017  . At risk for alteration in fetal status 04/16/2017  . Chronic hypertension during pregnancy, antepartum 11/14/2016  . Diabetes mellitus affecting pregnancy, antepartum 10/17/2016  . Low vitamin D level 09/30/2016  . Abnormal Pap smear of cervix 09/30/2016  . AMA (advanced maternal age) multigravida 35+ 09/30/2016  . DM (diabetes mellitus) ([facility]) 09/19/2016  . S/P cholecystectomy 09/19/2016  . History of bladder repair surgery 09/19/2016  . H/O carpal tunnel repair 09/19/2016  . Obese 09/19/2016  . Supervision of high risk pregnancy, antepartum 10/13/2013  . Chronic hypertension 10/13/2013   Past Medical History:  Diagnosis Date  . Bronchitis   . Diabetes mellitus    type 2 chronic  . Hypertension   . Obesity   . PCOS (polycystic ovarian syndrome)   . Pneumonia     Family History  Problem Relation Age of Onset  . Cancer Maternal Grandmother   . Hypertension Mother   . Fibroids Mother   . Diabetes Paternal Aunt   . Stroke Maternal Grandfather   . Diabetes Paternal Grandmother   . Stroke Paternal Grandmother   . Cancer Father        lung  . Heart disease Paternal Grandfather     Past Surgical History:  Procedure Laterality Date  . BLADDER REPAIR    . CARPAL TUNNEL RELEASE    . CHOLECYSTECTOMY     Social History   Occupational History  . Not on file  Tobacco Use  . Smoking status: Current Every Day Smoker    Packs/day: 1.00    Years: 18.00    Pack years: 18.00    Types: Cigarettes  . Smokeless tobacco: Never Used  . Tobacco comment: 6 day  Substance and Sexual Activity   . Alcohol use: No    Alcohol/week: 0.0 standard drinks  . Drug use: No  . Sexual activity: Not on file

## 2019-09-14 ENCOUNTER — Ambulatory Visit: Payer: Medicaid Other | Admitting: Orthopaedic Surgery

## 2019-10-01 ENCOUNTER — Ambulatory Visit
Admission: RE | Admit: 2019-10-01 | Discharge: 2019-10-01 | Disposition: A | Discharge status: Home / Self Care | Payer: Medicaid Other | Source: Ambulatory Visit | Attending: Orthopaedic Surgery | Admitting: Orthopaedic Surgery

## 2019-10-01 DIAGNOSIS — Z96611 Presence of right artificial shoulder joint: Secondary | ICD-10-CM

## 2019-10-02 ENCOUNTER — Other Ambulatory Visit: Payer: Self-pay | Admitting: Orthopaedic Surgery

## 2019-10-02 ENCOUNTER — Telehealth: Payer: Self-pay | Admitting: Orthopaedic Surgery

## 2019-10-02 MED ORDER — HYDROCODONE-ACETAMINOPHEN 5-325 MG PO TABS: 1.0000 | ORAL_TABLET | Freq: Four times a day (QID) | ORAL | 0 refills | Status: DC | PRN

## 2019-10-02 NOTE — Telephone Encounter (Signed)
I sent in some hydrocodone.  This really all I can do right now.  She should also take Aleve or ibuprofen in between the hydrocodone but she should still use it sparingly.

## 2019-10-02 NOTE — Telephone Encounter (Signed)
That's not how that works unfortunately, she will have to get a disc made from the facility to see him herself or wait until her appt, the doctor will show her then at that point when he goes over the results

## 2019-10-02 NOTE — Telephone Encounter (Signed)
Pt called requesting the images from her Mri taken on 10/01/19 be uploaded to mychart so she can look at them.  [phone]

## 2019-10-02 NOTE — Telephone Encounter (Signed)
Please advise 

## 2019-10-02 NOTE — Telephone Encounter (Signed)
Patient called requesting pain medication. Patient states to have had MRI and pain has worsen since then. Patient is in sever pains. Please send to [facility] on [address]. Patient phone number is [phone].

## 2019-10-05 ENCOUNTER — Other Ambulatory Visit: Payer: Self-pay

## 2019-10-05 ENCOUNTER — Ambulatory Visit (INDEPENDENT_AMBULATORY_CARE_PROVIDER_SITE_OTHER): Payer: Medicaid Other | Admitting: Orthopaedic Surgery

## 2019-10-05 ENCOUNTER — Encounter: Payer: Self-pay | Admitting: Orthopaedic Surgery

## 2019-10-05 DIAGNOSIS — G8929 Other chronic pain: Secondary | ICD-10-CM | POA: Diagnosis not present

## 2019-10-05 DIAGNOSIS — M75121 Complete rotator cuff tear or rupture of right shoulder, not specified as traumatic: Secondary | ICD-10-CM

## 2019-10-05 DIAGNOSIS — M25511 Pain in right shoulder: Secondary | ICD-10-CM

## 2019-10-05 NOTE — Progress Notes (Signed)
Office Visit Note   Patient: Michelle Houston           Date of Birth: 09/06/1980           MRN: [id number] Visit Date: 10/05/2019              Requested by: Nolene Ebbs, MD [address] [place],  [place] [address] PCP: Nolene Ebbs, MD   Assessment & Plan: Visit Diagnoses:  1. Nontraumatic complete tear of right rotator cuff   2. Chronic right shoulder pain     Plan: I did go over her right shoulder MRI findings with her and showed her a shoulder model.  Given her shoulder weakness and pain in her young age we are recommending an arthroscopic intervention with a possible rotator cuff repair depending on her interoperative findings.  I discussed in detail what the surgery involves.  I discussed the risk and benefits of surgery and what to expect with her interoperative and postoperative course.  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  All questions and concerns were answered addressed.  We will work on getting her surgery scheduled and we will be in touch.  We would then see her back at 1 week postoperative.  Follow-Up Instructions: Return for 1 week post-op.   Orders:  No orders of the defined types were placed in this encounter.  No orders of the defined types were placed in this encounter.     Procedures: No procedures performed   Clinical Data: No additional findings.   Subjective: Chief Complaint  Patient presents with  . Right Shoulder - Follow-up  The patient comes in today to go over an MRI of her right shoulder.  She is right-hand dominant.  She is only 39 years old and has been dealing with chronic bilateral shoulder pain for some time now.  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  HPI  Review of Systems She currently  denies any headache, chest pain, shortness of breath, fever, chills, nausea, vomiting  Objective: Vital Signs: There were no vitals taken for this visit.  Physical Exam She is alert and orient x3 and in no acute distress Ortho Exam Examination of her right shoulder does show weakness of the shoulder with abduction.  She has good range of motion of the shoulder because she is pushing through the pain but is definitely weak and painful throughout the arc of motion. Specialty Comments:  No specialty comments available.  Imaging: No results found. The MRI of her right shoulder does show a full-thickness tear of the supraspinatus tendon is retracted about 1 cm.  PMFS History: Patient Active Problem List   Diagnosis Date Noted  . Impingement syndrome of right shoulder 12/09/2017  . Impingement syndrome of left shoulder 12/09/2017  . SVD (spontaneous vaginal delivery) 04/18/2017  . At risk for alteration in fetal status 04/16/2017  . Chronic hypertension during pregnancy, antepartum 11/14/2016  . Diabetes mellitus affecting pregnancy, antepartum 10/17/2016  . Low vitamin D level 09/30/2016  . Abnormal Pap smear of cervix 09/30/2016  . AMA (advanced maternal age) multigravida 35+ 09/30/2016  . DM (diabetes mellitus) ([facility]) 09/19/2016  . S/P cholecystectomy 09/19/2016  . History of bladder repair surgery 09/19/2016  .  H/O carpal tunnel repair 09/19/2016  . Obese 09/19/2016  . Supervision of high risk pregnancy, antepartum 10/13/2013  . Chronic hypertension 10/13/2013   Past Medical History:  Diagnosis Date  . Bronchitis   . Diabetes mellitus    type 2 chronic  . Hypertension   . Obesity   . PCOS (polycystic ovarian syndrome)   . Pneumonia     Family History  Problem Relation Age of Onset  . Cancer Maternal Grandmother   . Hypertension Mother   . Fibroids Mother   . Diabetes Paternal Aunt   . Stroke Maternal Grandfather   . Diabetes Paternal Grandmother   . Stroke Paternal  Grandmother   . Cancer Father        lung  . Heart disease Paternal Grandfather     Past Surgical History:  Procedure Laterality Date  . BLADDER REPAIR    . CARPAL TUNNEL RELEASE    . CHOLECYSTECTOMY     Social History   Occupational History  . Not on file  Tobacco Use  . Smoking status: Current Every Day Smoker    Packs/day: 1.00    Years: 18.00    Pack years: 18.00    Types: Cigarettes  . Smokeless tobacco: Never Used  . Tobacco comment: 6 day  Substance and Sexual Activity  . Alcohol use: No    Alcohol/week: 0.0 standard drinks  . Drug use: No  . Sexual activity: Not on file

## 2019-10-08 ENCOUNTER — Telehealth: Payer: Self-pay | Admitting: Orthopaedic Surgery

## 2019-10-08 NOTE — Telephone Encounter (Signed)
Patient called about an update about note to take patient out of work. 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 Magnus Ivan or nurse to give a call back. Sending message urgent. Patient phone number is [phone].

## 2019-10-08 NOTE — Telephone Encounter (Signed)
Patient called requesting Dr. Magnus Ivan takes her out of work until surgery. Patient states she has conversated with Dr. Magnus Ivan about her arm and she can not continue to work in pain. Patient is requesting Dr. Magnus Ivan call her to discuss a work note to be taken out of work. Patient phone number is [phone].

## 2019-10-08 NOTE — Telephone Encounter (Signed)
Patient aware note at front desk  

## 2019-10-20 ENCOUNTER — Telehealth: Payer: Self-pay | Admitting: Orthopaedic Surgery

## 2019-10-20 MED ORDER — HYDROCODONE-ACETAMINOPHEN 5-325 MG PO TABS: 1.0000 | ORAL_TABLET | Freq: Three times a day (TID) | ORAL | 0 refills | Status: DC | PRN

## 2019-10-20 NOTE — Telephone Encounter (Signed)
Please advise 

## 2019-10-20 NOTE — Telephone Encounter (Signed)
Pt called stating she's supposed to be getting scheduled for surgery but is still waiting and would like a refill of her hydrocodone while she waits.   [phone]

## 2019-10-23 ENCOUNTER — Other Ambulatory Visit: Payer: Self-pay | Admitting: Orthopaedic Surgery

## 2019-10-26 MED ORDER — MELOXICAM 15 MG PO TABS: 15.0000 mg | ORAL_TABLET | Freq: Every day | ORAL | 0 refills | Status: DC

## 2019-10-26 MED ORDER — TIZANIDINE HCL 4 MG PO TABS: 4.0000 mg | ORAL_TABLET | Freq: Three times a day (TID) | ORAL | 0 refills | Status: DC | PRN

## 2019-10-26 NOTE — Telephone Encounter (Signed)
Ok to refill 

## 2019-10-29 ENCOUNTER — Emergency Department (HOSPITAL_COMMUNITY)
Admission: EM | Admit: 2019-10-29 | Discharge: 2019-10-29 | Disposition: A | Discharge status: Home / Self Care | Payer: Medicaid Other | Attending: Emergency Medicine | Admitting: Emergency Medicine

## 2019-10-29 ENCOUNTER — Encounter (HOSPITAL_COMMUNITY): Payer: Self-pay

## 2019-10-29 ENCOUNTER — Other Ambulatory Visit: Payer: Self-pay

## 2019-10-29 ENCOUNTER — Emergency Department (HOSPITAL_COMMUNITY): Payer: Medicaid Other

## 2019-10-29 DIAGNOSIS — Z5321 Procedure and treatment not carried out due to patient leaving prior to being seen by health care provider: Secondary | ICD-10-CM | POA: Insufficient documentation

## 2019-10-29 DIAGNOSIS — R6884 Jaw pain: Secondary | ICD-10-CM | POA: Diagnosis not present

## 2019-10-29 LAB — CBC
HCT: 42.4 % (ref 36.0–46.0)
Hemoglobin: 14 g/dL (ref 12.0–15.0)
MCH: 29.7 pg (ref 26.0–34.0)
MCHC: 33 g/dL (ref 30.0–36.0)
MCV: 90 fL (ref 80.0–100.0)
Platelets: 315 10*3/uL (ref 150–400)
RBC: 4.71 MIL/uL (ref 3.87–5.11)
RDW: 12.7 % (ref 11.5–15.5)
WBC: 4.2 10*3/uL (ref 4.0–10.5)
nRBC: 0 % (ref 0.0–0.2)

## 2019-10-29 LAB — I-STAT BETA HCG BLOOD, ED (MC, WL, AP ONLY): I-stat hCG, quantitative: 30.8 m[IU]/mL — ABNORMAL HIGH (ref <5)

## 2019-10-29 LAB — BASIC METABOLIC PANEL
Anion gap: 8 (ref 5–15)
BUN: 8 mg/dL (ref 6–20)
CO2: 21 mmol/L — ABNORMAL LOW (ref 22–32)
Calcium: 8.9 mg/dL (ref 8.9–10.3)
Chloride: 103 mmol/L (ref 98–111)
Creatinine, Ser: 0.86 mg/dL (ref 0.44–1.00)
GFR calc Af Amer: >60 mL/min (ref >60)
GFR calc non Af Amer: >60 mL/min (ref >60)
Glucose, Bld: 145 mg/dL — ABNORMAL HIGH (ref 70–99)
Potassium: 3.9 mmol/L (ref 3.5–5.1)
Sodium: 132 mmol/L — ABNORMAL LOW (ref 135–145)

## 2019-10-29 LAB — TROPONIN I (HIGH SENSITIVITY): Troponin I (High Sensitivity): 4 ng/L (ref <18)

## 2019-10-29 MED ORDER — SODIUM CHLORIDE 0.9% FLUSH: 3.0000 mL | Freq: Once | INTRAVENOUS | Status: DC

## 2019-10-29 NOTE — ED Notes (Signed)
Pt stated that she was not going to wait any longer. She stated that she was going to leave and call her PCP.

## 2019-10-29 NOTE — ED Triage Notes (Signed)
Patient complains of left anterior cp with left jaw pain, also reports SOB. States that the pain is worse with movement

## 2019-10-30 ENCOUNTER — Encounter: Payer: Self-pay | Admitting: Orthopaedic Surgery

## 2019-10-30 ENCOUNTER — Telehealth: Payer: Self-pay

## 2019-10-30 NOTE — Telephone Encounter (Signed)
Spoke with patient, shoulder arthroscopy had been planned for 11/26/19.  Patient has just found out she is pregnant.  Per Dr. Magnus Ivan, surgery needs to be canceled and he cannot prescribe pain meds or give injections at this point.  I advised patient.

## 2019-11-15 ENCOUNTER — Inpatient Hospital Stay (HOSPITAL_COMMUNITY)
Admission: AD | Admit: 2019-11-15 | Discharge: 2019-11-15 | Disposition: A | Discharge status: Home / Self Care | Payer: Medicaid Other | Attending: Obstetrics & Gynecology | Admitting: Obstetrics & Gynecology

## 2019-11-15 ENCOUNTER — Inpatient Hospital Stay (HOSPITAL_COMMUNITY): Payer: Medicaid Other

## 2019-11-15 ENCOUNTER — Encounter (HOSPITAL_COMMUNITY): Payer: Self-pay | Admitting: Obstetrics & Gynecology

## 2019-11-15 ENCOUNTER — Other Ambulatory Visit: Payer: Self-pay

## 2019-11-15 DIAGNOSIS — O99211 Obesity complicating pregnancy, first trimester: Secondary | ICD-10-CM | POA: Diagnosis not present

## 2019-11-15 DIAGNOSIS — Z630 Problems in relationship with spouse or partner: Secondary | ICD-10-CM | POA: Diagnosis not present

## 2019-11-15 DIAGNOSIS — F17210 Nicotine dependence, cigarettes, uncomplicated: Secondary | ICD-10-CM | POA: Diagnosis not present

## 2019-11-15 DIAGNOSIS — Z791 Long term (current) use of non-steroidal anti-inflammatories (NSAID): Secondary | ICD-10-CM | POA: Insufficient documentation

## 2019-11-15 DIAGNOSIS — Z3A01 Less than 8 weeks gestation of pregnancy: Secondary | ICD-10-CM | POA: Insufficient documentation

## 2019-11-15 DIAGNOSIS — O26891 Other specified pregnancy related conditions, first trimester: Secondary | ICD-10-CM | POA: Diagnosis not present

## 2019-11-15 DIAGNOSIS — Z7984 Long term (current) use of oral hypoglycemic drugs: Secondary | ICD-10-CM | POA: Insufficient documentation

## 2019-11-15 DIAGNOSIS — O24311 Unspecified pre-existing diabetes mellitus in pregnancy, first trimester: Secondary | ICD-10-CM | POA: Insufficient documentation

## 2019-11-15 DIAGNOSIS — E119 Type 2 diabetes mellitus without complications: Secondary | ICD-10-CM | POA: Insufficient documentation

## 2019-11-15 DIAGNOSIS — Z8249 Family history of ischemic heart disease and other diseases of the circulatory system: Secondary | ICD-10-CM | POA: Insufficient documentation

## 2019-11-15 DIAGNOSIS — Z79899 Other long term (current) drug therapy: Secondary | ICD-10-CM | POA: Diagnosis not present

## 2019-11-15 DIAGNOSIS — E282 Polycystic ovarian syndrome: Secondary | ICD-10-CM | POA: Diagnosis not present

## 2019-11-15 DIAGNOSIS — O99331 Smoking (tobacco) complicating pregnancy, first trimester: Secondary | ICD-10-CM | POA: Insufficient documentation

## 2019-11-15 DIAGNOSIS — O4691 Antepartum hemorrhage, unspecified, first trimester: Secondary | ICD-10-CM

## 2019-11-15 DIAGNOSIS — O99281 Endocrine, nutritional and metabolic diseases complicating pregnancy, first trimester: Secondary | ICD-10-CM | POA: Diagnosis not present

## 2019-11-15 DIAGNOSIS — O209 Hemorrhage in early pregnancy, unspecified: Secondary | ICD-10-CM

## 2019-11-15 DIAGNOSIS — Z833 Family history of diabetes mellitus: Secondary | ICD-10-CM | POA: Insufficient documentation

## 2019-11-15 DIAGNOSIS — O09521 Supervision of elderly multigravida, first trimester: Secondary | ICD-10-CM | POA: Insufficient documentation

## 2019-11-15 DIAGNOSIS — Z3491 Encounter for supervision of normal pregnancy, unspecified, first trimester: Secondary | ICD-10-CM

## 2019-11-15 DIAGNOSIS — O10911 Unspecified pre-existing hypertension complicating pregnancy, first trimester: Secondary | ICD-10-CM | POA: Diagnosis not present

## 2019-11-15 DIAGNOSIS — E669 Obesity, unspecified: Secondary | ICD-10-CM | POA: Diagnosis not present

## 2019-11-15 DIAGNOSIS — R109 Unspecified abdominal pain: Secondary | ICD-10-CM | POA: Insufficient documentation

## 2019-11-15 DIAGNOSIS — O26899 Other specified pregnancy related conditions, unspecified trimester: Secondary | ICD-10-CM

## 2019-11-15 LAB — CBC
HCT: 39.8 % (ref 36.0–46.0)
Hemoglobin: 13.2 g/dL (ref 12.0–15.0)
MCH: 29.7 pg (ref 26.0–34.0)
MCHC: 33.2 g/dL (ref 30.0–36.0)
MCV: 89.6 fL (ref 80.0–100.0)
Platelets: 299 10*3/uL (ref 150–400)
RBC: 4.44 MIL/uL (ref 3.87–5.11)
RDW: 12.8 % (ref 11.5–15.5)
WBC: 5.7 10*3/uL (ref 4.0–10.5)
nRBC: 0 % (ref 0.0–0.2)

## 2019-11-15 LAB — URINALYSIS, ROUTINE W REFLEX MICROSCOPIC
Bilirubin Urine: NEGATIVE
Glucose, UA: NEGATIVE mg/dL
Hgb urine dipstick: NEGATIVE
Ketones, ur: NEGATIVE mg/dL
Leukocytes,Ua: NEGATIVE
Nitrite: NEGATIVE
Protein, ur: NEGATIVE mg/dL
Specific Gravity, Urine: 1.006 (ref 1.005–1.030)
pH: 5 (ref 5.0–8.0)

## 2019-11-15 LAB — WET PREP, GENITAL
Sperm: NONE SEEN
Trich, Wet Prep: NONE SEEN
Yeast Wet Prep HPF POC: NONE SEEN

## 2019-11-15 LAB — HCG, QUANTITATIVE, PREGNANCY: hCG, Beta Chain, Quant, S: 10221 m[IU]/mL — ABNORMAL HIGH (ref <5)

## 2019-11-15 NOTE — MAU Note (Signed)
Michelle Houston is a 39 y.o. at Unknown here in [facility] reporting:  +vaginal bleeding Light pink Notices when she wipes.  1st OB appt on the 19th at [facility].  LMP: 10/02/19  Onset of complaint: today  +lower abdominal cramping Pain score: 3/10 intermittent  Vitals:   11/15/19 1405  BP: 116/74  Pulse: 98  Resp: 20  Temp: 98.5 F (36.9 C)  SpO2: 100%      Lab orders placed from triage: ua

## 2019-11-15 NOTE — Discharge Instructions (Signed)
Return to care   If you have heavier bleeding that soaks through more that 2 pads per hour for an hour or more  If you bleed so much that you feel like you might pass out or you do pass out  If you have significant abdominal pain that is not improved with Tylenol     Vaginal Bleeding During Pregnancy, First Trimester  A small amount of bleeding from the vagina (spotting) is relatively common during early pregnancy. It usually stops on its own. Various things may cause bleeding or spotting during early pregnancy. Some bleeding may be related to the pregnancy, and some may not. In many cases, the bleeding is normal and is not a problem. However, bleeding can also be a sign of something serious. Be sure to tell your health care provider about any vaginal bleeding right away. Some possible causes of vaginal bleeding during the first trimester include:  Infection or inflammation of the cervix.  Growths (polyps) on the cervix.  Miscarriage or threatened miscarriage.  Pregnancy tissue developing outside of the uterus (ectopic pregnancy).  A mass of tissue developing in the uterus due to an egg being fertilized incorrectly (molar pregnancy). Follow these instructions at home: Activity  Follow instructions from your health care provider about limiting your activity. Ask what activities are safe for you.  If needed, make plans for someone to help with your regular activities.  Do not have sex or orgasms until your health care provider says that this is safe. General instructions  Take over-the-counter and prescription medicines only as told by your health care provider.  Pay attention to any changes in your symptoms.  Do not use tampons or douche.  Write down how many pads you use each day, how often you change pads, and how soaked (saturated) they are.  If you pass any tissue from your vagina, save the tissue so you can show it to your health care provider.  Keep all follow-up  visits as told by your health care provider. This is important. Contact a health care provider if:  You have vaginal bleeding during any part of your pregnancy.  You have cramps or labor pains.  You have a fever. Get help right away if:  You have severe cramps in your back or abdomen.  You pass large clots or a large amount of tissue from your vagina.  Your bleeding increases.  You feel light-headed or weak, or you faint.  You have chills.  You are leaking fluid or have a gush of fluid from your vagina. Summary  A small amount of bleeding (spotting) from the vagina is relatively common during early pregnancy.  Various things may cause bleeding or spotting in early pregnancy.  Be sure to tell your health care provider about any vaginal bleeding right away. This information is not intended to replace advice given to you by your health care provider. Make sure you discuss any questions you have with your health care provider. Document Revised: 08/26/2018 Document Reviewed: 08/09/2016 Elsevier Patient Education  2020 [employer]   

## 2019-11-15 NOTE — MAU Provider Note (Signed)
Chief Complaint: Abdominal Pain and Vaginal Bleeding   First Provider Initiated Contact with Patient 11/15/19 1413     SUBJECTIVE HPI: Michelle Houston is a 39 y.o. [id number] at [redacted]w[redacted]d who presents to Maternity Admissions reporting vaginal bleeding and abdominal cramping. Symptoms started this morning after a verbal altercation with her fiance. Reports light pink spotting on toilet paper and some mild abdominal cramping. Denies any other symptoms. Has first ob appointment at [facility] in July.   Location: abdomen Quality: cramping Severity: 3/10 on pain scale Duration: <1 day Timing: intermittent Modifying factors: none Associated signs and symptoms: pink spotting  Past Medical History:  Diagnosis Date  . Bronchitis   . Diabetes mellitus    type 2 chronic  . Hypertension   . Obesity   . PCOS (polycystic ovarian syndrome)   . Pneumonia    OB History  Gravida Para Term Preterm AB Living  6 4 4   1 4   SAB TAB Ectopic Multiple Live Births  1     0 4    # Outcome Date GA Lbr Len/2nd Weight Sex Delivery Anes PTL Lv  6 Current           5 Term 04/16/17 [redacted]w[redacted]d / 00:07 2977 g F Vag-Spont EPI  LIV     Birth Comments: Extra digit on left hand   4 Term 05/12/14 [redacted]w[redacted]d 07:53 / 00:06 3350 g F Vag-Spont EPI  LIV     Birth Comments: WNL  3 SAB 07/2012        DEC  2 Term 07/1999 [redacted]w[redacted]d  3260 g F Vag-Spont None  LIV  1 Term 02/1996 [redacted]w[redacted]d  3147 g F Vag-Spont None  LIV   Past Surgical History:  Procedure Laterality Date  . BLADDER REPAIR    . CARPAL TUNNEL RELEASE    . CHOLECYSTECTOMY     Social History   Socioeconomic History  . Marital status: Legally Separated    Spouse name: Not on file  . Number of children: Not on file  . Years of education: Not on file  . Highest education level: Not on file  Occupational History  . Not on file  Tobacco Use  . Smoking status: Current Every Day Smoker    Packs/day: 1.00    Years: 18.00    Pack years: 18.00    Types: Cigarettes  . Smokeless  tobacco: Never Used  . Tobacco comment: 6 day  Vaping Use  . Vaping Use: Never used  Substance and Sexual Activity  . Alcohol use: No    Alcohol/week: 0.0 standard drinks  . Drug use: No  . Sexual activity: Not on file  Other Topics Concern  . Not on file  Social History Narrative  . Not on file   Social Determinants of Health   Financial Resource Strain:   . Difficulty of Paying Living Expenses:   Food Insecurity:   . Worried About Charity fundraiser in the Last Year:   . Arboriculturist in the Last Year:   Transportation Needs:   . Film/video editor (Medical):   Marland Kitchen Lack of Transportation (Non-Medical):   Physical Activity:   . Days of Exercise per Week:   . Minutes of Exercise per Session:   Stress:   . Feeling of Stress :   Social Connections:   . Frequency of Communication with Friends and Family:   . Frequency of Social Gatherings with Friends and Family:   . Attends Religious Services:   .  Active Member of Clubs or Organizations:   . Attends Banker Meetings:   Marland Kitchen Marital Status:   Intimate Partner Violence:   . Fear of Current or Ex-Partner:   . Emotionally Abused:   Marland Kitchen Physically Abused:   . Sexually Abused:    Family History  Problem Relation Age of Onset  . Cancer Maternal Grandmother   . Hypertension Mother   . Fibroids Mother   . Diabetes Paternal Aunt   . Stroke Maternal Grandfather   . Diabetes Paternal Grandmother   . Stroke Paternal Grandmother   . Cancer Father        lung  . Heart disease Paternal Grandfather    No current facility-administered medications on file prior to encounter.   Current Outpatient Medications on File Prior to Encounter  Medication Sig Dispense Refill  . HYDROcodone-acetaminophen (NORCO/VICODIN) 5-325 MG tablet Take 1 tablet by mouth 3 (three) times daily as needed for moderate pain. 30 tablet 0  . meloxicam (MOBIC) 15 MG tablet Take 1 tablet (15 mg total) by mouth daily. 30 tablet 0  . metFORMIN  (GLUCOPHAGE) 500 MG tablet Take 500 mg by mouth daily.    Marland Kitchen omeprazole (PRILOSEC) 20 MG capsule Take 20 mg by mouth every morning.    Marland Kitchen tiZANidine (ZANAFLEX) 4 MG tablet Take 1 tablet (4 mg total) by mouth every 8 (eight) hours as needed for muscle spasms. 40 tablet 0  . Vitamin D, Ergocalciferol, (DRISDOL) 50000 units CAPS capsule Take 1 capsule (50,000 Units total) by mouth every 7 (seven) days. 30 capsule 2  . [DISCONTINUED] albuterol (PROVENTIL HFA;VENTOLIN HFA) 108 (90 Base) MCG/ACT inhaler Inhale 2 puffs into the lungs every 6 (six) hours as needed for wheezing or shortness of breath. (Patient not taking: Reported on 01/01/2019) 18 g 0  . [DISCONTINUED] fluticasone (FLONASE) 50 MCG/ACT nasal spray Place 1 spray into both nostrils daily. (Patient not taking: Reported on 01/01/2019) 16 g 2   No Known Allergies  I have reviewed patient's Past Medical Hx, Surgical Hx, Family Hx, Social Hx, medications and allergies.   Review of Systems  Constitutional: Negative.   Gastrointestinal: Positive for abdominal pain. Negative for constipation, diarrhea, nausea and vomiting.  Genitourinary: Positive for vaginal bleeding. Negative for dysuria and vaginal discharge.    OBJECTIVE Patient Vitals for the past 24 hrs:  BP Temp Temp src Pulse Resp SpO2  11/15/19 1629 122/77 -- -- 90 19 100 %  11/15/19 1405 116/74 98.5 F (36.9 C) Oral 98 20 100 %   Constitutional: Well-developed, well-nourished female in no acute distress.  Cardiovascular: normal rate & rhythm, no murmur Respiratory: normal rate and effort. Lung sounds clear throughout GI: Abd soft, non-tender, Pos BS x 4. No guarding or rebound tenderness MS: Extremities nontender, no edema, normal ROM Neurologic: Alert and oriented x 4.  GU:   NEFG, no blood. No abnormal discharge  LAB RESULTS Results for orders placed or performed during the hospital encounter of 11/15/19 (from the past 24 hour(s))  CBC     Status: None   Collection Time:  11/15/19  2:41 PM  Result Value Ref Range   WBC 5.7 4.0 - 10.5 K/uL   RBC 4.44 3.87 - 5.11 MIL/uL   Hemoglobin 13.2 12.0 - 15.0 g/dL   HCT 28.0 36 - 46 %   MCV 89.6 80.0 - 100.0 fL   MCH 29.7 26.0 - 34.0 pg   MCHC 33.2 30.0 - 36.0 g/dL   RDW 03.4 91.7 - 91.5 %  Platelets 299 150 - 400 K/uL   nRBC 0.0 0.0 - 0.2 %  hCG, quantitative, pregnancy     Status: Abnormal   Collection Time: 11/15/19  2:41 PM  Result Value Ref Range   hCG, Beta Chain, Quant, S 10,221 (H) <5 mIU/mL  Urinalysis, Routine w reflex microscopic     Status: None   Collection Time: 11/15/19  2:55 PM  Result Value Ref Range   Color, Urine YELLOW YELLOW   APPearance CLEAR CLEAR   Specific Gravity, Urine 1.006 1.005 - 1.030   pH 5.0 5.0 - 8.0   Glucose, UA NEGATIVE NEGATIVE mg/dL   Hgb urine dipstick NEGATIVE NEGATIVE   Bilirubin Urine NEGATIVE NEGATIVE   Ketones, ur NEGATIVE NEGATIVE mg/dL   Protein, ur NEGATIVE NEGATIVE mg/dL   Nitrite NEGATIVE NEGATIVE   Leukocytes,Ua NEGATIVE NEGATIVE  Wet prep, genital     Status: Abnormal   Collection Time: 11/15/19  2:56 PM  Result Value Ref Range   Yeast Wet Prep HPF POC NONE SEEN NONE SEEN   Trich, Wet Prep NONE SEEN NONE SEEN   Clue Cells Wet Prep HPF POC PRESENT (A) NONE SEEN   WBC, Wet Prep HPF POC FEW (A) NONE SEEN   Sperm NONE SEEN     IMAGING US OB LESS THAN 14 WEEKS WITH OB TRANSVAGINAL  Result Date: 11/15/2019 CLINICAL DATA:  Abdominal cramping and vaginal bleeding in first trimester of pregnancy; LMP = 10/02/2019; quantitative beta HCG = 10,221 EXAM: OBSTETRIC <14 WK [place] AND TRANSVAGINAL OB US TECHNIQUE: Both transabdominal and transvaginal ultrasound examinations were performed for complete evaluation of the gestation as well as the maternal uterus, adnexal regions, and pelvic cul-de-sac. Transvaginal technique was performed to assess early pregnancy. COMPARISON:  None for this gestation FINDINGS: Intrauterine gestational sac: Present, single Yolk sac:   Present Embryo:  Present Cardiac Activity: Present Heart Rate: 116 bpm CRL:  2.9 mm   5 w   5 d                  [place] EDC: 07/12/2020 Subchorionic hemorrhage:  No definite subchronic hemorrhage. Maternal uterus/adnexae: Uterus anteverted with tiny exophytic leiomyoma at posterior uterus 13 x 11 x 16 mm. RIGHT ovary normal size and morphology 4.1 x 2.1 x 2.9 cm, containing small corpus luteum. LEFT ovary normal size and morphology 2.7 x 1.8 x 2.0 cm. Trace free pelvic fluid. No adnexal masses. IMPRESSION: Single live intrauterine gestation at 5 weeks 5 days EGA by crown-rump length. No acute abnormalities. 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 Electronically Signed   By: Ulyses Southward M.D.   On: 11/15/2019 16:07    MAU COURSE Orders Placed This Encounter  Procedures  . Wet prep, genital  . US OB LESS THAN 14 WEEKS WITH OB TRANSVAGINAL  . Urinalysis, Routine w reflex microscopic  . CBC  . hCG, quantitative, pregnancy  . Discharge patient   No orders of the defined types were placed in this encounter.   MDM +UPT UA, wet prep, GC/chlamydia, CBC, ABO/Rh, quant hCG, and [place] today to rule out ectopic pregnancy which can be life threatening.   RH positive  Ultrasound shows live IUP  Wet prep with clue cells. Pt denies abnormal discharge & no abnormal discharge on exam.  GC/CT pending  ASSESSMENT 1. Normal IUP (intrauterine pregnancy) on prenatal ultrasound, first trimester   2. Vaginal bleeding in pregnancy, first trimester   3. Abdominal cramping affecting pregnancy   4. [redacted]  weeks gestation of pregnancy     PLAN Discharge home in stable condition. Discussed reasons to return to MAU Keep OB appointment    Allergies as of 11/15/2019   No Known Allergies     Medication List    STOP taking these medications   amLODipine 10 MG tablet Commonly known as: NORVASC   benzonatate 100 MG capsule Commonly known as: TESSALON   lisinopril 10 MG tablet Commonly  known as: ZESTRIL   phentermine 37.5 MG tablet Commonly known as: ADIPEX-P     TAKE these medications   HYDROcodone-acetaminophen 5-325 MG tablet Commonly known as: NORCO/VICODIN Take 1 tablet by mouth 3 (three) times daily as needed for moderate pain.   meloxicam 15 MG tablet Commonly known as: MOBIC Take 1 tablet (15 mg total) by mouth daily.   metFORMIN 500 MG tablet Commonly known as: GLUCOPHAGE Take 500 mg by mouth daily.   omeprazole 20 MG capsule Commonly known as: PRILOSEC Take 20 mg by mouth every morning.   tiZANidine 4 MG tablet Commonly known as: Zanaflex Take 1 tablet (4 mg total) by mouth every 8 (eight) hours as needed for muscle spasms.   Vitamin D (Ergocalciferol) 1.25 MG (50000 UNIT) Caps capsule Commonly known as: DRISDOL Take 1 capsule (50,000 Units total) by mouth every 7 (seven) days.        Judeth Horn, NP 11/15/2019  5:13 PM

## 2019-11-16 ENCOUNTER — Ambulatory Visit: Payer: Medicaid Other | Admitting: Orthopaedic Surgery

## 2019-11-16 LAB — GC/CHLAMYDIA PROBE AMP (~~LOC~~) NOT AT ARMC
Chlamydia: NEGATIVE
Comment: NEGATIVE
Comment: NORMAL
Neisseria Gonorrhea: NEGATIVE

## 2019-11-26 ENCOUNTER — Encounter (HOSPITAL_BASED_OUTPATIENT_CLINIC_OR_DEPARTMENT_OTHER): Payer: Self-pay

## 2019-11-26 ENCOUNTER — Ambulatory Visit (HOSPITAL_BASED_OUTPATIENT_CLINIC_OR_DEPARTMENT_OTHER): Admit: 2019-11-26 | Payer: Medicaid Other | Admitting: Orthopaedic Surgery

## 2019-11-26 SURGERY — SHOULDER ARTHROSCOPY WITH ROTATOR CUFF REPAIR AND SUBACROMIAL DECOMPRESSION
Anesthesia: General | Site: Shoulder | Laterality: Right

## 2019-12-07 ENCOUNTER — Ambulatory Visit (INDEPENDENT_AMBULATORY_CARE_PROVIDER_SITE_OTHER): Payer: Medicaid Other

## 2019-12-07 DIAGNOSIS — Z3A09 9 weeks gestation of pregnancy: Secondary | ICD-10-CM

## 2019-12-07 DIAGNOSIS — O09521 Supervision of elderly multigravida, first trimester: Secondary | ICD-10-CM

## 2019-12-07 DIAGNOSIS — O09529 Supervision of elderly multigravida, unspecified trimester: Secondary | ICD-10-CM | POA: Insufficient documentation

## 2019-12-07 NOTE — Progress Notes (Signed)
Patient was assessed and managed by nursing staff during this encounter. I have reviewed the chart and agree with the documentation and plan. I have also made any necessary editorial changes.  Leeah Politano, MD 12/07/2019 11:25 AM    

## 2019-12-07 NOTE — Progress Notes (Signed)
.    Virtual Visit via Telephone Note  I connected with Michelle Houston on 12/07/19 at  9:00 AM EDT by telephone and verified that I am speaking with the correct person using two identifiers.  Location: Patient: Michelle Houston Provider: Eduard Roux, CMA    I discussed the limitations, risks, security and privacy concerns of performing an evaluation and management service by telephone and the availability of in person appointments. I also discussed with the patient that there may be a patient responsible charge related to this service. The patient expressed understanding and agreed to proceed.   History of Present Illness: PRENATAL INTAKE SUMMARY  Michelle Houston presents today New OB Nurse Interview.  OB History    Gravida  6   Para  4   Term  4   Preterm      AB  1   Living  4     SAB  1   TAB      Ectopic      Multiple  0   Live Births  4          I have reviewed the patient's medical, obstetrical, social, and family histories, medications, and available lab results.  SUBJECTIVE Reports pink vaginal discharge after wiping, denies cramping. Last IC, yesterday.    Observations/Objective: Initial nurse interview for history/labs (New OB) EDD: 07/08/2020 GA: [redacted]w[redacted]d G6P4 FHT: non face to face interview   GENERAL APPEARANCE: alert, oriented to person, non face to face interview   Assessment and Plan: Prenatal care [facility] High Risk Pregnancy advanced maternal age, diet controlled diabetes, CHTN, BMI 43.48   Labs to be completed at [facility] provider visit Virtual visits & Babyscripts explained  Pt has BP cuff at home Report to MAU or contact the office if pink vaginal discharge changes and/or pain occurs   Follow Up Instructions:   I discussed the assessment and treatment plan with the patient. The patient was provided an opportunity to ask questions and all were answered. The patient agreed with the plan and demonstrated an understanding of the  instructions.   The patient was advised to call back or seek an in-person evaluation if the symptoms worsen or if the condition fails to improve as anticipated.  I provided 20 minutes of non-face-to-face time during this encounter.   Dalphine Handing, CMA

## 2019-12-14 ENCOUNTER — Ambulatory Visit (INDEPENDENT_AMBULATORY_CARE_PROVIDER_SITE_OTHER): Payer: Medicaid Other | Admitting: Obstetrics and Gynecology

## 2019-12-14 ENCOUNTER — Encounter: Payer: Self-pay | Admitting: Obstetrics and Gynecology

## 2019-12-14 ENCOUNTER — Other Ambulatory Visit: Payer: Self-pay

## 2019-12-14 VITALS — BP 125/84 | HR 101 | Wt 232.6 lb

## 2019-12-14 DIAGNOSIS — Z3A10 10 weeks gestation of pregnancy: Secondary | ICD-10-CM | POA: Diagnosis not present

## 2019-12-14 DIAGNOSIS — O99210 Obesity complicating pregnancy, unspecified trimester: Secondary | ICD-10-CM

## 2019-12-14 DIAGNOSIS — E669 Obesity, unspecified: Secondary | ICD-10-CM

## 2019-12-14 DIAGNOSIS — O09521 Supervision of elderly multigravida, first trimester: Secondary | ICD-10-CM

## 2019-12-14 DIAGNOSIS — O24111 Pre-existing diabetes mellitus, type 2, in pregnancy, first trimester: Secondary | ICD-10-CM

## 2019-12-14 DIAGNOSIS — O10011 Pre-existing essential hypertension complicating pregnancy, first trimester: Secondary | ICD-10-CM

## 2019-12-14 DIAGNOSIS — O24919 Unspecified diabetes mellitus in pregnancy, unspecified trimester: Secondary | ICD-10-CM

## 2019-12-14 DIAGNOSIS — E119 Type 2 diabetes mellitus without complications: Secondary | ICD-10-CM

## 2019-12-14 DIAGNOSIS — Z3481 Encounter for supervision of other normal pregnancy, first trimester: Secondary | ICD-10-CM | POA: Diagnosis not present

## 2019-12-14 DIAGNOSIS — O10919 Unspecified pre-existing hypertension complicating pregnancy, unspecified trimester: Secondary | ICD-10-CM

## 2019-12-14 DIAGNOSIS — O09529 Supervision of elderly multigravida, unspecified trimester: Secondary | ICD-10-CM

## 2019-12-14 DIAGNOSIS — I10 Essential (primary) hypertension: Secondary | ICD-10-CM

## 2019-12-14 MED ORDER — ASPIRIN EC 81 MG PO TBEC
81.0000 mg | DELAYED_RELEASE_TABLET | Freq: Every day | ORAL | 2 refills | Status: AC
Start: 2019-12-14 — End: ?

## 2019-12-14 NOTE — Progress Notes (Signed)
Subjective:    Michelle Houston is a [id number] [redacted]w[redacted]d being seen today for her first obstetrical visit.  Her obstetrical history is significant for advanced maternal age, obesity and CHTN and type 2 diabetes. Patient on daily labetalol. Patient does intend to breast feed. Pregnancy history fully reviewed. Patient currently not working due to shoulder injury  Patient reports no complaints.  Vitals:   12/14/19 0931  BP: 125/84  Pulse: 101  Weight: (!) 232 lb 9.6 oz (105.5 kg)    HISTORY: OB History  Gravida Para Term Preterm AB Living  6 4 4   1 4   SAB TAB Ectopic Multiple Live Births  1     0 4    # Outcome Date GA Lbr Len/2nd Weight Sex Delivery Anes PTL Lv  6 Current           5 Term 04/16/17 [redacted]w[redacted]d / 00:07 6 lb 9 oz (2.977 kg) F Vag-Spont EPI  LIV     Birth Comments: Extra digit on left hand   4 Term 05/12/14 [redacted]w[redacted]d 07:53 / 00:06 7 lb 6.2 oz (3.35 kg) F Vag-Spont EPI  LIV     Birth Comments: WNL  3 SAB 07/2012        DEC  2 Term 07/1999 [redacted]w[redacted]d  7 lb 3 oz (3.26 kg) F Vag-Spont None  LIV  1 Term 02/1996 [redacted]w[redacted]d  6 lb 15 oz (3.147 kg) F Vag-Spont None  LIV   Past Medical History:  Diagnosis Date  . Bronchitis   . Diabetes mellitus    type 2 chronic  . Hypertension   . Obesity   . PCOS (polycystic ovarian syndrome)   . Pneumonia    Past Surgical History:  Procedure Laterality Date  . BLADDER REPAIR    . CARPAL TUNNEL RELEASE    . CHOLECYSTECTOMY     Family History  Problem Relation Age of Onset  . Cancer Maternal Grandmother   . Hypertension Mother   . Fibroids Mother   . Diabetes Paternal Aunt   . Stroke Maternal Grandfather   . Diabetes Paternal Grandmother   . Stroke Paternal Grandmother   . Cancer Father        lung  . Heart disease Paternal Grandfather      Exam    Uterus:     Pelvic Exam:    Perineum: No Hemorrhoids, Normal Perineum   Vulva: normal   Vagina:  normal mucosa, normal discharge   pH:    Cervix: multiparous appearance and cervix is  closed   Adnexa: not evaluated   Bony Pelvis: gynecoid  System: Breast:  normal appearance, no masses or tenderness   Skin: normal coloration and turgor, no rashes    Neurologic: oriented, no focal deficits   Extremities: normal strength, tone, and muscle mass   HEENT extra ocular movement intact   Mouth/Teeth mucous membranes moist, pharynx normal without lesions and dental hygiene good   Neck supple and no masses   Cardiovascular: regular rate and rhythm   Respiratory:  appears well, vitals normal, no respiratory distress, acyanotic, normal RR, chest clear, no wheezing, crepitations, rhonchi, normal symmetric air entry   Abdomen: soft, non-tender; bowel sounds normal; no masses,  no organomegaly   Urinary:       Assessment:    Pregnancy: [redacted]w[redacted]d Patient Active Problem List   Diagnosis Date Noted  . Maternal obesity affecting pregnancy, antepartum 12/14/2019  . Encounter for supervision of high risk multigravida of advanced maternal age, antepartum  12/07/2019  . Chronic hypertension during pregnancy, antepartum 11/14/2016  . Diabetes mellitus affecting pregnancy, antepartum 10/17/2016  . AMA (advanced maternal age) multigravida 35+ 09/30/2016  . DM (diabetes mellitus) (HCC) 09/19/2016  . S/P cholecystectomy 09/19/2016  . History of bladder repair surgery 09/19/2016  . H/O carpal tunnel repair 09/19/2016  . Obese 09/19/2016  . Chronic hypertension 10/13/2013        Plan:     Initial labs drawn. Prenatal vitamins. Problem list reviewed and updated. Genetic Screening discussed : panorama ordered.  Ultrasound discussed; fetal survey: ordered. Baseline labs ordered Rx ASA provided to start in 2 weeks Patient referred to ophthalmology Patient referred to diabetic educator. Patient prefers to start with insulin rather than take metformin  Follow up in 4 weeks. 50% of 30 min visit spent on counseling and coordination of care.     Nisreen Guise 12/14/2019

## 2019-12-14 NOTE — Patient Instructions (Signed)
 First Trimester of Pregnancy The first trimester of pregnancy is from week 1 until the end of week 13 (months 1 through 3). A week after a sperm fertilizes an egg, the egg will implant on the wall of the uterus. This embryo will begin to develop into a baby. Genes from you and your partner will form the baby. The female genes will determine whether the baby will be a boy or a girl. At 6-8 weeks, the eyes and face will be formed, and the heartbeat can be seen on ultrasound. At the end of 12 weeks, all the baby's organs will be formed. Now that you are pregnant, you will want to do everything you can to have a healthy baby. Two of the most important things are to get good prenatal care and to follow your health care provider's instructions. Prenatal care is all the medical care you receive before the baby's birth. This care will help prevent, find, and treat any problems during the pregnancy and childbirth. Body changes during your first trimester Your body goes through many changes during pregnancy. The changes vary from woman to woman.  You may gain or lose a couple of pounds at first.  You may feel sick to your stomach (nauseous) and you may throw up (vomit). If the vomiting is uncontrollable, call your health care provider.  You may tire easily.  You may develop headaches that can be relieved by medicines. All medicines should be approved by your health care provider.  You may urinate more often. Painful urination may mean you have a bladder infection.  You may develop heartburn as a result of your pregnancy.  You may develop constipation because certain hormones are causing the muscles that push stool through your intestines to slow down.  You may develop hemorrhoids or swollen veins (varicose veins).  Your breasts may begin to grow larger and become tender. Your nipples may stick out more, and the tissue that surrounds them (areola) may become darker.  Your gums may bleed and may be  sensitive to brushing and flossing.  Dark spots or blotches (chloasma, mask of pregnancy) may develop on your face. This will likely fade after the baby is born.  Your menstrual periods will stop.  You may have a loss of appetite.  You may develop cravings for certain kinds of food.  You may have changes in your emotions from day to day, such as being excited to be pregnant or being concerned that something may go wrong with the pregnancy and baby.  You may have more vivid and strange dreams.  You may have changes in your hair. These can include thickening of your hair, rapid growth, and changes in texture. Some women also have hair loss during or after pregnancy, or hair that feels dry or thin. Your hair will most likely return to normal after your baby is born. What to expect at prenatal visits During a routine prenatal visit:  You will be weighed to make sure you and the baby are growing normally.  Your blood pressure will be taken.  Your abdomen will be measured to track your baby's growth.  The fetal heartbeat will be listened to between weeks 10 and 14 of your pregnancy.  Test results from any previous visits will be discussed. Your health care provider may ask you:  How you are feeling.  If you are feeling the baby move.  If you have had any abnormal symptoms, such as leaking fluid, bleeding, severe headaches, or   abdominal cramping.  If you are using any tobacco products, including cigarettes, chewing tobacco, and electronic cigarettes.  If you have any questions. Other tests that may be performed during your first trimester include:  Blood tests to find your blood type and to check for the presence of any previous infections. The tests will also be used to check for low iron levels (anemia) and protein on red blood cells (Rh antibodies). Depending on your risk factors, or if you previously had diabetes during pregnancy, you may have tests to check for high blood sugar  that affects pregnant women (gestational diabetes).  Urine tests to check for infections, diabetes, or protein in the urine.  An ultrasound to confirm the proper growth and development of the baby.  Fetal screens for spinal cord problems (spina bifida) and Down syndrome.  HIV (human immunodeficiency virus) testing. Routine prenatal testing includes screening for HIV, unless you choose not to have this test.  You may need other tests to make sure you and the baby are doing well. Follow these instructions at home: Medicines  Follow your health care provider's instructions regarding medicine use. Specific medicines may be either safe or unsafe to take during pregnancy.  Take a prenatal vitamin that contains at least 600 micrograms (mcg) of folic acid.  If you develop constipation, try taking a stool softener if your health care provider approves. Eating and drinking   Eat a balanced diet that includes fresh fruits and vegetables, whole grains, good sources of protein such as meat, eggs, or tofu, and low-fat dairy. Your health care provider will help you determine the amount of weight gain that is right for you.  Avoid raw meat and uncooked cheese. These carry germs that can cause birth defects in the baby.  Eating four or five small meals rather than three large meals a day may help relieve nausea and vomiting. If you start to feel nauseous, eating a few soda crackers can be helpful. Drinking liquids between meals, instead of during meals, also seems to help ease nausea and vomiting.  Limit foods that are high in fat and processed sugars, such as fried and sweet foods.  To prevent constipation: ? Eat foods that are high in fiber, such as fresh fruits and vegetables, whole grains, and beans. ? Drink enough fluid to keep your urine clear or pale yellow. Activity  Exercise only as directed by your health care provider. Most women can continue their usual exercise routine during  pregnancy. Try to exercise for 30 minutes at least 5 days a week. Exercising will help you: ? Control your weight. ? Stay in shape. ? Be prepared for labor and delivery.  Experiencing pain or cramping in the lower abdomen or lower back is a good sign that you should stop exercising. Check with your health care provider before continuing with normal exercises.  Try to avoid standing for long periods of time. Move your legs often if you must stand in one place for a long time.  Avoid heavy lifting.  Wear low-heeled shoes and practice good posture.  You may continue to have sex unless your health care provider tells you not to. Relieving pain and discomfort  Wear a good support bra to relieve breast tenderness.  Take warm sitz baths to soothe any pain or discomfort caused by hemorrhoids. Use hemorrhoid cream if your health care provider approves.  Rest with your legs elevated if you have leg cramps or low back pain.  If you develop varicose veins   in your legs, wear support hose. Elevate your feet for 15 minutes, 3-4 times a day. Limit salt in your diet. Prenatal care  Schedule your prenatal visits by the twelfth week of pregnancy. They are usually scheduled monthly at first, then more often in the last 2 months before delivery.  Write down your questions. Take them to your prenatal visits.  Keep all your prenatal visits as told by your health care provider. This is important. Safety  Wear your seat belt at all times when driving.  Make a list of emergency phone numbers, including numbers for family, friends, the hospital, and police and fire departments. General instructions  Ask your health care provider for a referral to a local prenatal education class. Begin classes no later than the beginning of month 6 of your pregnancy.  Ask for help if you have counseling or nutritional needs during pregnancy. Your health care provider can offer advice or refer you to specialists for help  with various needs.  Do not use hot tubs, steam rooms, or saunas.  Do not douche or use tampons or scented sanitary pads.  Do not cross your legs for long periods of time.  Avoid cat litter boxes and soil used by cats. These carry germs that can cause birth defects in the baby and possibly loss of the fetus by miscarriage or stillbirth.  Avoid all smoking, herbs, alcohol, and medicines not prescribed by your health care provider. Chemicals in these products affect the formation and growth of the baby.  Do not use any products that contain nicotine or tobacco, such as cigarettes and e-cigarettes. If you need help quitting, ask your health care provider. You may receive counseling support and other resources to help you quit.  Schedule a dentist appointment. At home, brush your teeth with a soft toothbrush and be gentle when you floss. Contact a health care provider if:  You have dizziness.  You have mild pelvic cramps, pelvic pressure, or nagging pain in the abdominal area.  You have persistent nausea, vomiting, or diarrhea.  You have a bad smelling vaginal discharge.  You have pain when you urinate.  You notice increased swelling in your face, hands, legs, or ankles.  You are exposed to fifth disease or chickenpox.  You are exposed to German measles (rubella) and have never had it. Get help right away if:  You have a fever.  You are leaking fluid from your vagina.  You have spotting or bleeding from your vagina.  You have severe abdominal cramping or pain.  You have rapid weight gain or loss.  You vomit blood or material that looks like coffee grounds.  You develop a severe headache.  You have shortness of breath.  You have any kind of trauma, such as from a fall or a car accident. Summary  The first trimester of pregnancy is from week 1 until the end of week 13 (months 1 through 3).  Your body goes through many changes during pregnancy. The changes vary from  woman to woman.  You will have routine prenatal visits. During those visits, your health care provider will examine you, discuss any test results you may have, and talk with you about how you are feeling. This information is not intended to replace advice given to you by your health care provider. Make sure you discuss any questions you have with your health care provider. Document Revised: 04/19/2017 Document Reviewed: 04/18/2016 [employer] Patient Education  2020 [employer]   Second Trimester of   Pregnancy The second trimester is from week 14 through week 27 (months 4 through 6). The second trimester is often a time when you feel your best. Your body has adjusted to being pregnant, and you begin to feel better physically. Usually, morning sickness has lessened or quit completely, you may have more energy, and you may have an increase in appetite. The second trimester is also a time when the fetus is growing rapidly. At the end of the sixth month, the fetus is about 9 inches long and weighs about 1 pounds. You will likely begin to feel the baby move (quickening) between 16 and 20 weeks of pregnancy. Body changes during your second trimester Your body continues to go through many changes during your second trimester. The changes vary from woman to woman.  Your weight will continue to increase. You will notice your lower abdomen bulging out.  You may begin to get stretch marks on your hips, abdomen, and breasts.  You may develop headaches that can be relieved by medicines. The medicines should be approved by your health care provider.  You may urinate more often because the fetus is pressing on your bladder.  You may develop or continue to have heartburn as a result of your pregnancy.  You may develop constipation because certain hormones are causing the muscles that push waste through your intestines to slow down.  You may develop hemorrhoids or swollen, bulging veins (varicose  veins).  You may have back pain. This is caused by: ? Weight gain. ? Pregnancy hormones that are relaxing the joints in your pelvis. ? A shift in weight and the muscles that support your balance.  Your breasts will continue to grow and they will continue to become tender.  Your gums may bleed and may be sensitive to brushing and flossing.  Dark spots or blotches (chloasma, mask of pregnancy) may develop on your face. This will likely fade after the baby is born.  A dark line from your belly button to the pubic area (linea nigra) may appear. This will likely fade after the baby is born.  You may have changes in your hair. These can include thickening of your hair, rapid growth, and changes in texture. Some women also have hair loss during or after pregnancy, or hair that feels dry or thin. Your hair will most likely return to normal after your baby is born. What to expect at prenatal visits During a routine prenatal visit:  You will be weighed to make sure you and the fetus are growing normally.  Your blood pressure will be taken.  Your abdomen will be measured to track your baby's growth.  The fetal heartbeat will be listened to.  Any test results from the previous visit will be discussed. Your health care provider may ask you:  How you are feeling.  If you are feeling the baby move.  If you have had any abnormal symptoms, such as leaking fluid, bleeding, severe headaches, or abdominal cramping.  If you are using any tobacco products, including cigarettes, chewing tobacco, and electronic cigarettes.  If you have any questions. Other tests that may be performed during your second trimester include:  Blood tests that check for: ? Low iron levels (anemia). ? High blood sugar that affects pregnant women (gestational diabetes) between 24 and 28 weeks. ? Rh antibodies. This is to check for a protein on red blood cells (Rh factor).  Urine tests to check for infections,  diabetes, or protein in the urine.    An ultrasound to confirm the proper growth and development of the baby.  An amniocentesis to check for possible genetic problems.  Fetal screens for spina bifida and Down syndrome.  HIV (human immunodeficiency virus) testing. Routine prenatal testing includes screening for HIV, unless you choose not to have this test. Follow these instructions at home: Medicines  Follow your health care provider's instructions regarding medicine use. Specific medicines may be either safe or unsafe to take during pregnancy.  Take a prenatal vitamin that contains at least 600 micrograms (mcg) of folic acid.  If you develop constipation, try taking a stool softener if your health care provider approves. Eating and drinking   Eat a balanced diet that includes fresh fruits and vegetables, whole grains, good sources of protein such as meat, eggs, or tofu, and low-fat dairy. Your health care provider will help you determine the amount of weight gain that is right for you.  Avoid raw meat and uncooked cheese. These carry germs that can cause birth defects in the baby.  If you have low calcium intake from food, talk to your health care provider about whether you should take a daily calcium supplement.  Limit foods that are high in fat and processed sugars, such as fried and sweet foods.  To prevent constipation: ? Drink enough fluid to keep your urine clear or pale yellow. ? Eat foods that are high in fiber, such as fresh fruits and vegetables, whole grains, and beans. Activity  Exercise only as directed by your health care provider. Most women can continue their usual exercise routine during pregnancy. Try to exercise for 30 minutes at least 5 days a week. Stop exercising if you experience uterine contractions.  Avoid heavy lifting, wear low heel shoes, and practice good posture.  A sexual relationship may be continued unless your health care provider directs you  otherwise. Relieving pain and discomfort  Wear a good support bra to prevent discomfort from breast tenderness.  Take warm sitz baths to soothe any pain or discomfort caused by hemorrhoids. Use hemorrhoid cream if your health care provider approves.  Rest with your legs elevated if you have leg cramps or low back pain.  If you develop varicose veins, wear support hose. Elevate your feet for 15 minutes, 3-4 times a day. Limit salt in your diet. Prenatal Care  Write down your questions. Take them to your prenatal visits.  Keep all your prenatal visits as told by your health care provider. This is important. Safety  Wear your seat belt at all times when driving.  Make a list of emergency phone numbers, including numbers for family, friends, the hospital, and police and fire departments. General instructions  Ask your health care provider for a referral to a local prenatal education class. Begin classes no later than the beginning of month 6 of your pregnancy.  Ask for help if you have counseling or nutritional needs during pregnancy. Your health care provider can offer advice or refer you to specialists for help with various needs.  Do not use hot tubs, steam rooms, or saunas.  Do not douche or use tampons or scented sanitary pads.  Do not cross your legs for long periods of time.  Avoid cat litter boxes and soil used by cats. These carry germs that can cause birth defects in the baby and possibly loss of the fetus by miscarriage or stillbirth.  Avoid all smoking, herbs, alcohol, and unprescribed drugs. Chemicals in these products can affect the formation and growth of   the baby.  Do not use any products that contain nicotine or tobacco, such as cigarettes and e-cigarettes. If you need help quitting, ask your health care provider.  Visit your dentist if you have not gone yet during your pregnancy. Use a soft toothbrush to brush your teeth and be gentle when you floss. Contact a  health care provider if:  You have dizziness.  You have mild pelvic cramps, pelvic pressure, or nagging pain in the abdominal area.  You have persistent nausea, vomiting, or diarrhea.  You have a bad smelling vaginal discharge.  You have pain when you urinate. Get help right away if:  You have a fever.  You are leaking fluid from your vagina.  You have spotting or bleeding from your vagina.  You have severe abdominal cramping or pain.  You have rapid weight gain or weight loss.  You have shortness of breath with chest pain.  You notice sudden or extreme swelling of your face, hands, ankles, feet, or legs.  You have not felt your baby move in over an hour.  You have severe headaches that do not go away when you take medicine.  You have vision changes. Summary  The second trimester is from week 14 through week 27 (months 4 through 6). It is also a time when the fetus is growing rapidly.  Your body goes through many changes during pregnancy. The changes vary from woman to woman.  Avoid all smoking, herbs, alcohol, and unprescribed drugs. These chemicals affect the formation and growth your baby.  Do not use any tobacco products, such as cigarettes, chewing tobacco, and e-cigarettes. If you need help quitting, ask your health care provider.  Contact your health care provider if you have any questions. Keep all prenatal visits as told by your health care provider. This is important. This information is not intended to replace advice given to you by your health care provider. Make sure you discuss any questions you have with your health care provider. Document Revised: 08/29/2018 Document Reviewed: 06/12/2016 [employer] Patient Education  2020 [employer]   Contraception Choices Contraception, also called birth control, refers to methods or devices that prevent pregnancy. Hormonal methods Contraceptive implant  A contraceptive implant is a thin, plastic tube that  contains a hormone. It is inserted into the upper part of the arm. It can remain in place for up to 3 years. Progestin-only injections Progestin-only injections are injections of progestin, a synthetic form of the hormone progesterone. They are given every 3 months by a health care provider. Birth control pills  Birth control pills are pills that contain hormones that prevent pregnancy. They must be taken once a day, preferably at the same time each day. Birth control patch  The birth control patch contains hormones that prevent pregnancy. It is placed on the skin and must be changed once a week for three weeks and removed on the fourth week. A prescription is needed to use this method of contraception. Vaginal ring  A vaginal ring contains hormones that prevent pregnancy. It is placed in the vagina for three weeks and removed on the fourth week. After that, the process is repeated with a new ring. A prescription is needed to use this method of contraception. Emergency contraceptive Emergency contraceptives prevent pregnancy after unprotected sex. They come in pill form and can be taken up to 5 days after sex. They work best the sooner they are taken after having sex. Most emergency contraceptives are available without a prescription. This   method should not be used as your only form of birth control. Barrier methods Female condom  A female condom is a thin sheath that is worn over the penis during sex. Condoms keep sperm from going inside a woman's body. They can be used with a spermicide to increase their effectiveness. They should be disposed after a single use. Female condom  A female condom is a soft, loose-fitting sheath that is put into the vagina before sex. The condom keeps sperm from going inside a woman's body. They should be disposed after a single use. Diaphragm  A diaphragm is a soft, dome-shaped barrier. It is inserted into the vagina before sex, along with a spermicide. The  diaphragm blocks sperm from entering the uterus, and the spermicide kills sperm. A diaphragm should be left in the vagina for 6-8 hours after sex and removed within 24 hours. A diaphragm is prescribed and fitted by a health care provider. A diaphragm should be replaced every 1-2 years, after giving birth, after gaining more than 15 lb (6.8 kg), and after pelvic surgery. Cervical cap  A cervical cap is a round, soft latex or plastic cup that fits over the cervix. It is inserted into the vagina before sex, along with spermicide. It blocks sperm from entering the uterus. The cap should be left in place for 6-8 hours after sex and removed within 48 hours. A cervical cap must be prescribed and fitted by a health care provider. It should be replaced every 2 years. Sponge  A sponge is a soft, circular piece of polyurethane foam with spermicide on it. The sponge helps block sperm from entering the uterus, and the spermicide kills sperm. To use it, you make it wet and then insert it into the vagina. It should be inserted before sex, left in for at least 6 hours after sex, and removed and thrown away within 30 hours. Spermicides Spermicides are chemicals that kill or block sperm from entering the cervix and uterus. They can come as a cream, jelly, suppository, foam, or tablet. A spermicide should be inserted into the vagina with an applicator at least 10-15 minutes before sex to allow time for it to work. The process must be repeated every time you have sex. Spermicides do not require a prescription. Intrauterine contraception Intrauterine device (IUD) An IUD is a T-shaped device that is put in a woman's uterus. There are two types:  Hormone IUD.This type contains progestin, a synthetic form of the hormone progesterone. This type can stay in place for 3-5 years.  Copper IUD.This type is wrapped in copper wire. It can stay in place for 10 years.  Permanent methods of contraception Female tubal ligation In  this method, a woman's fallopian tubes are sealed, tied, or blocked during surgery to prevent eggs from traveling to the uterus. Hysteroscopic sterilization In this method, a small, flexible insert is placed into each fallopian tube. The inserts cause scar tissue to form in the fallopian tubes and block them, so sperm cannot reach an egg. The procedure takes about 3 months to be effective. Another form of birth control must be used during those 3 months. Female sterilization This is a procedure to tie off the tubes that carry sperm (vasectomy). After the procedure, the man can still ejaculate fluid (semen). Natural planning methods Natural family planning In this method, a couple does not have sex on days when the woman could become pregnant. Calendar method This means keeping track of the length of each menstrual cycle,   identifying the days when pregnancy can happen, and not having sex on those days. Ovulation method In this method, a couple avoids sex during ovulation. Symptothermal method This method involves not having sex during ovulation. The woman typically checks for ovulation by watching changes in her temperature and in the consistency of cervical mucus. Post-ovulation method In this method, a couple waits to have sex until after ovulation. Summary  Contraception, also called birth control, means methods or devices that prevent pregnancy.  Hormonal methods of contraception include implants, injections, pills, patches, vaginal rings, and emergency contraceptives.  Barrier methods of contraception can include female condoms, female condoms, diaphragms, cervical caps, sponges, and spermicides.  There are two types of IUDs (intrauterine devices). An IUD can be put in a woman's uterus to prevent pregnancy for 3-5 years.  Permanent sterilization can be done through a procedure for males, females, or both.  Natural family planning methods involve not having sex on days when the woman could  become pregnant. This information is not intended to replace advice given to you by your health care provider. Make sure you discuss any questions you have with your health care provider. Document Revised: 05/09/2017 Document Reviewed: 06/09/2016 Elsevier Patient Education  2020 [employer] Inc[employer]   Breastfeeding  Choosing to breastfeed is one of the best decisions you can make for yourself and your baby. A change in hormones during pregnancy causes your breasts to make breast milk in your milk-producing glands. Hormones prevent breast milk from being released before your baby is born. They also prompt milk flow after birth. Once breastfeeding has begun, thoughts of your baby, as well as his or her sucking or crying, can stimulate the release of milk from your milk-producing glands. Benefits of breastfeeding Research shows that breastfeeding offers many health benefits for infants and mothers. It also offers a cost-free and convenient way to feed your baby. For your baby  Your first milk (colostrum) helps your baby's digestive system to function better.  Special cells in your milk (antibodies) help your baby to fight off infections.  Breastfed babies are less likely to develop asthma, allergies, obesity, or type 2 diabetes. They are also at lower risk for sudden infant death syndrome (SIDS).  Nutrients in breast milk are better able to meet your baby's needs compared to infant formula.  Breast milk improves your baby's brain development. For you  Breastfeeding helps to create a very special bond between you and your baby.  Breastfeeding is convenient. Breast milk costs nothing and is always available at the correct temperature.  Breastfeeding helps to burn calories. It helps you to lose the weight that you gained during pregnancy.  Breastfeeding makes your uterus return faster to its size before pregnancy. It also slows bleeding (lochia) after you give birth.  Breastfeeding helps to lower  your risk of developing type 2 diabetes, osteoporosis, rheumatoid arthritis, cardiovascular disease, and breast, ovarian, uterine, and endometrial cancer later in life. Breastfeeding basics Starting breastfeeding  Find a comfortable place to sit or lie down, with your neck and back well-supported.  Place a pillow or a rolled-up blanket under your baby to bring him or her to the level of your breast (if you are seated). Nursing pillows are specially designed to help support your arms and your baby while you breastfeed.  Make sure that your baby's tummy (abdomen) is facing your abdomen.  Gently massage your breast. With your fingertips, massage from the outer edges of your breast inward toward the nipple. This encourages   milk flow. If your milk flows slowly, you may need to continue this action during the feeding.  Support your breast with 4 fingers underneath and your thumb above your nipple (make the letter "C" with your hand). Make sure your fingers are well away from your nipple and your baby's mouth.  Stroke your baby's lips gently with your finger or nipple.  When your baby's mouth is open wide enough, quickly bring your baby to your breast, placing your entire nipple and as much of the areola as possible into your baby's mouth. The areola is the colored area around your nipple. ? More areola should be visible above your baby's upper lip than below the lower lip. ? Your baby's lips should be opened and extended outward (flanged) to ensure an adequate, comfortable latch. ? Your baby's tongue should be between his or her lower gum and your breast.  Make sure that your baby's mouth is correctly positioned around your nipple (latched). Your baby's lips should create a seal on your breast and be turned out (everted).  It is common for your baby to suck about 2-3 minutes in order to start the flow of breast milk. Latching Teaching your baby how to latch onto your breast properly is very  important. An improper latch can cause nipple pain, decreased milk supply, and poor weight gain in your baby. Also, if your baby is not latched onto your nipple properly, he or she may swallow some air during feeding. This can make your baby fussy. Burping your baby when you switch breasts during the feeding can help to get rid of the air. However, teaching your baby to latch on properly is still the best way to prevent fussiness from swallowing air while breastfeeding. Signs that your baby has successfully latched onto your nipple  Silent tugging or silent sucking, without causing you pain. Infant's lips should be extended outward (flanged).  Swallowing heard between every 3-4 sucks once your milk has started to flow (after your let-down milk reflex occurs).  Muscle movement above and in front of his or her ears while sucking. Signs that your baby has not successfully latched onto your nipple  Sucking sounds or smacking sounds from your baby while breastfeeding.  Nipple pain. If you think your baby has not latched on correctly, slip your finger into the corner of your baby's mouth to break the suction and place it between your baby's gums. Attempt to start breastfeeding again. Signs of successful breastfeeding Signs from your baby  Your baby will gradually decrease the number of sucks or will completely stop sucking.  Your baby will fall asleep.  Your baby's body will relax.  Your baby will retain a small amount of milk in his or her mouth.  Your baby will let go of your breast by himself or herself. Signs from you  Breasts that have increased in firmness, weight, and size 1-3 hours after feeding.  Breasts that are softer immediately after breastfeeding.  Increased milk volume, as well as a change in milk consistency and color by the fifth day of breastfeeding.  Nipples that are not sore, cracked, or bleeding. Signs that your baby is getting enough milk  Wetting at least 1-2  diapers during the first 24 hours after birth.  Wetting at least 5-6 diapers every 24 hours for the first week after birth. The urine should be clear or pale yellow by the age of 5 days.  Wetting 6-8 diapers every 24 hours as your baby continues   to grow and develop.  At least 3 stools in a 24-hour period by the age of 5 days. The stool should be soft and yellow.  At least 3 stools in a 24-hour period by the age of 7 days. The stool should be seedy and yellow.  No loss of weight greater than 10% of birth weight during the first 3 days of life.  Average weight gain of 4-7 oz (113-198 g) per week after the age of 4 days.  Consistent daily weight gain by the age of 5 days, without weight loss after the age of 2 weeks. After a feeding, your baby may spit up a small amount of milk. This is normal. Breastfeeding frequency and duration Frequent feeding will help you make more milk and can prevent sore nipples and extremely full breasts (breast engorgement). Breastfeed when you feel the need to reduce the fullness of your breasts or when your baby shows signs of hunger. This is called "breastfeeding on demand." Signs that your baby is hungry include:  Increased alertness, activity, or restlessness.  Movement of the head from side to side.  Opening of the mouth when the corner of the mouth or cheek is stroked (rooting).  Increased sucking sounds, smacking lips, cooing, sighing, or squeaking.  Hand-to-mouth movements and sucking on fingers or hands.  Fussing or crying. Avoid introducing a pacifier to your baby in the first 4-6 weeks after your baby is born. After this time, you may choose to use a pacifier. Research has shown that pacifier use during the first year of a baby's life decreases the risk of sudden infant death syndrome (SIDS). Allow your baby to feed on each breast as long as he or she wants. When your baby unlatches or falls asleep while feeding from the first breast, offer the  second breast. Because newborns are often sleepy in the first few weeks of life, you may need to awaken your baby to get him or her to feed. Breastfeeding times will vary from baby to baby. However, the following rules can serve as a guide to help you make sure that your baby is properly fed:  Newborns (babies 4 weeks of age or younger) may breastfeed every 1-3 hours.  Newborns should not go without breastfeeding for longer than 3 hours during the day or 5 hours during the night.  You should breastfeed your baby a minimum of 8 times in a 24-hour period. Breast milk pumping     Pumping and storing breast milk allows you to make sure that your baby is exclusively fed your breast milk, even at times when you are unable to breastfeed. This is especially important if you go back to work while you are still breastfeeding, or if you are not able to be present during feedings. Your lactation consultant can help you find a method of pumping that works best for you and give you guidelines about how long it is safe to store breast milk. Caring for your breasts while you breastfeed Nipples can become dry, cracked, and sore while breastfeeding. The following recommendations can help keep your breasts moisturized and healthy:  Avoid using soap on your nipples.  Wear a supportive bra designed especially for nursing. Avoid wearing underwire-style bras or extremely tight bras (sports bras).  Air-dry your nipples for 3-4 minutes after each feeding.  Use only cotton bra pads to absorb leaked breast milk. Leaking of breast milk between feedings is normal.  Use lanolin on your nipples after breastfeeding. Lanolin helps   to maintain your skin's normal moisture barrier. Pure lanolin is not harmful (not toxic) to your baby. You may also hand express a few drops of breast milk and gently massage that milk into your nipples and allow the milk to air-dry. In the first few weeks after giving birth, some women experience  breast engorgement. Engorgement can make your breasts feel heavy, warm, and tender to the touch. Engorgement peaks within 3-5 days after you give birth. The following recommendations can help to ease engorgement:  Completely empty your breasts while breastfeeding or pumping. You may want to start by applying warm, moist heat (in the shower or with warm, water-soaked hand towels) just before feeding or pumping. This increases circulation and helps the milk flow. If your baby does not completely empty your breasts while breastfeeding, pump any extra milk after he or she is finished.  Apply ice packs to your breasts immediately after breastfeeding or pumping, unless this is too uncomfortable for you. To do this: ? Put ice in a plastic bag. ? Place a towel between your skin and the bag. ? Leave the ice on for 20 minutes, 2-3 times a day.  Make sure that your baby is latched on and positioned properly while breastfeeding. If engorgement persists after 48 hours of following these recommendations, contact your health care provider or a lactation consultant. Overall health care recommendations while breastfeeding  Eat 3 healthy meals and 3 snacks every day. Well-nourished mothers who are breastfeeding need an additional 450-500 calories a day. You can meet this requirement by increasing the amount of a balanced diet that you eat.  Drink enough water to keep your urine pale yellow or clear.  Rest often, relax, and continue to take your prenatal vitamins to prevent fatigue, stress, and low vitamin and mineral levels in your body (nutrient deficiencies).  Do not use any products that contain nicotine or tobacco, such as cigarettes and e-cigarettes. Your baby may be harmed by chemicals from cigarettes that pass into breast milk and exposure to secondhand smoke. If you need help quitting, ask your health care provider.  Avoid alcohol.  Do not use illegal drugs or marijuana.  Talk with your health care  provider before taking any medicines. These include over-the-counter and prescription medicines as well as vitamins and herbal supplements. Some medicines that may be harmful to your baby can pass through breast milk.  It is possible to become pregnant while breastfeeding. If birth control is desired, ask your health care provider about options that will be safe while breastfeeding your baby. Where to find more information: [employer]: www.llli.org Contact a health care provider if:  You feel like you want to stop breastfeeding or have become frustrated with breastfeeding.  Your nipples are cracked or bleeding.  Your breasts are red, tender, or warm.  You have: ? Painful breasts or nipples. ? A swollen area on either breast. ? A fever or chills. ? Nausea or vomiting. ? Drainage other than breast milk from your nipples.  Your breasts do not become full before feedings by the fifth day after you give birth.  You feel sad and depressed.  Your baby is: ? Too sleepy to eat well. ? Having trouble sleeping. ? More than 1 week old and wetting fewer than 6 diapers in a 24-hour period. ? Not gaining weight by 5 days of age.  Your baby has fewer than 3 stools in a 24-hour period.  Your baby's skin or the white parts of   his or her eyes become yellow. Get help right away if:  Your baby is overly tired (lethargic) and does not want to wake up and feed.  Your baby develops an unexplained fever. Summary  Breastfeeding offers many health benefits for infant and mothers.  Try to breastfeed your infant when he or she shows early signs of hunger.  Gently tickle or stroke your baby's lips with your finger or nipple to allow the baby to open his or her mouth. Bring the baby to your breast. Make sure that much of the areola is in your baby's mouth. Offer one side and burp the baby before you offer the other side.  Talk with your health care provider or lactation consultant  if you have questions or you face problems as you breastfeed. This information is not intended to replace advice given to you by your health care provider. Make sure you discuss any questions you have with your health care provider. Document Revised: 08/01/2017 Document Reviewed: 06/08/2016 Elsevier Patient Education  2020 Elsevier Inc.  

## 2019-12-15 LAB — HEPATITIS C ANTIBODY: Hep C Virus Ab: <0.1 s/co ratio (ref 0.0–0.9)

## 2019-12-15 LAB — COMPREHENSIVE METABOLIC PANEL
ALT: 21 IU/L (ref 0–32)
AST: 19 IU/L (ref 0–40)
Albumin/Globulin Ratio: 1.6 (ref 1.2–2.2)
Albumin: 4 g/dL (ref 3.8–4.8)
Alkaline Phosphatase: 79 IU/L (ref 48–121)
BUN/Creatinine Ratio: 8 — ABNORMAL LOW (ref 9–23)
BUN: 6 mg/dL (ref 6–24)
Bilirubin Total: <0.2 mg/dL (ref 0.0–1.2)
CO2: 19 mmol/L — ABNORMAL LOW (ref 20–29)
Calcium: 9.1 mg/dL (ref 8.7–10.2)
Chloride: 102 mmol/L (ref 96–106)
Creatinine, Ser: 0.72 mg/dL (ref 0.57–1.00)
GFR calc Af Amer: 121 mL/min/{1.73_m2} (ref >59)
GFR calc non Af Amer: 105 mL/min/{1.73_m2} (ref >59)
Globulin, Total: 2.5 g/dL (ref 1.5–4.5)
Glucose: 129 mg/dL — ABNORMAL HIGH (ref 65–99)
Potassium: 4.2 mmol/L (ref 3.5–5.2)
Sodium: 134 mmol/L (ref 134–144)
Total Protein: 6.5 g/dL (ref 6.0–8.5)

## 2019-12-15 LAB — OBSTETRIC PANEL, INCLUDING HIV
Antibody Screen: NEGATIVE
Basophils Absolute: 0.1 10*3/uL (ref 0.0–0.2)
Basos: 1 %
EOS (ABSOLUTE): 0.1 10*3/uL (ref 0.0–0.4)
Eos: 2 %
HIV Screen 4th Generation wRfx: NONREACTIVE
Hematocrit: 39.3 % (ref 34.0–46.6)
Hemoglobin: 13.4 g/dL (ref 11.1–15.9)
Hepatitis B Surface Ag: NEGATIVE
Immature Grans (Abs): 0 10*3/uL (ref 0.0–0.1)
Immature Granulocytes: 0 %
Lymphocytes Absolute: 2 10*3/uL (ref 0.7–3.1)
Lymphs: 41 %
MCH: 30.4 pg (ref 26.6–33.0)
MCHC: 34.1 g/dL (ref 31.5–35.7)
MCV: 89 fL (ref 79–97)
Monocytes Absolute: 0.4 10*3/uL (ref 0.1–0.9)
Monocytes: 8 %
Neutrophils Absolute: 2.4 10*3/uL (ref 1.4–7.0)
Neutrophils: 48 %
Platelets: 297 10*3/uL (ref 150–450)
RBC: 4.41 x10E6/uL (ref 3.77–5.28)
RDW: 12.7 % (ref 11.7–15.4)
RPR Ser Ql: NONREACTIVE
Rh Factor: POSITIVE
Rubella Antibodies, IGG: 1.18 index (ref >0.99)
WBC: 5 10*3/uL (ref 3.4–10.8)

## 2019-12-15 LAB — TSH: TSH: 0.739 u[IU]/mL (ref 0.450–4.500)

## 2019-12-15 LAB — HEMOGLOBIN A1C
Est. average glucose Bld gHb Est-mCnc: 143 mg/dL
Hgb A1c MFr Bld: 6.6 % — ABNORMAL HIGH (ref 4.8–5.6)

## 2019-12-15 LAB — PROTEIN / CREATININE RATIO, URINE
Creatinine, Urine: 99.5 mg/dL
Protein, Ur: 9 mg/dL
Protein/Creat Ratio: 90 mg/g creat (ref 0–200)

## 2019-12-16 LAB — URINE CULTURE, OB REFLEX

## 2019-12-16 LAB — CULTURE, OB URINE

## 2019-12-21 ENCOUNTER — Encounter: Payer: Self-pay | Admitting: Obstetrics and Gynecology

## 2020-01-04 ENCOUNTER — Encounter: Payer: Self-pay | Admitting: Obstetrics and Gynecology

## 2020-01-04 DIAGNOSIS — O09529 Supervision of elderly multigravida, unspecified trimester: Secondary | ICD-10-CM

## 2020-01-06 ENCOUNTER — Other Ambulatory Visit: Payer: Self-pay

## 2020-01-06 DIAGNOSIS — O09529 Supervision of elderly multigravida, unspecified trimester: Secondary | ICD-10-CM

## 2020-01-07 ENCOUNTER — Encounter: Payer: Self-pay | Admitting: *Deleted

## 2020-01-07 ENCOUNTER — Other Ambulatory Visit: Payer: Self-pay | Admitting: *Deleted

## 2020-01-07 ENCOUNTER — Ambulatory Visit: Payer: Medicaid Other | Attending: Obstetrics and Gynecology

## 2020-01-07 ENCOUNTER — Ambulatory Visit: Payer: Medicaid Other | Admitting: *Deleted

## 2020-01-07 ENCOUNTER — Other Ambulatory Visit: Payer: Self-pay

## 2020-01-07 DIAGNOSIS — O10919 Unspecified pre-existing hypertension complicating pregnancy, unspecified trimester: Secondary | ICD-10-CM

## 2020-01-07 DIAGNOSIS — O24111 Pre-existing diabetes mellitus, type 2, in pregnancy, first trimester: Secondary | ICD-10-CM | POA: Diagnosis not present

## 2020-01-07 DIAGNOSIS — Z363 Encounter for antenatal screening for malformations: Secondary | ICD-10-CM | POA: Diagnosis not present

## 2020-01-07 DIAGNOSIS — O99211 Obesity complicating pregnancy, first trimester: Secondary | ICD-10-CM

## 2020-01-07 DIAGNOSIS — Z148 Genetic carrier of other disease: Secondary | ICD-10-CM

## 2020-01-07 DIAGNOSIS — O10012 Pre-existing essential hypertension complicating pregnancy, second trimester: Secondary | ICD-10-CM | POA: Diagnosis not present

## 2020-01-07 DIAGNOSIS — Z3A13 13 weeks gestation of pregnancy: Secondary | ICD-10-CM

## 2020-01-07 DIAGNOSIS — E669 Obesity, unspecified: Secondary | ICD-10-CM

## 2020-01-07 DIAGNOSIS — O99210 Obesity complicating pregnancy, unspecified trimester: Secondary | ICD-10-CM

## 2020-01-07 DIAGNOSIS — O09529 Supervision of elderly multigravida, unspecified trimester: Secondary | ICD-10-CM | POA: Diagnosis present

## 2020-01-11 ENCOUNTER — Other Ambulatory Visit: Payer: Self-pay

## 2020-01-11 ENCOUNTER — Encounter: Payer: Self-pay | Admitting: Obstetrics and Gynecology

## 2020-01-11 ENCOUNTER — Ambulatory Visit (INDEPENDENT_AMBULATORY_CARE_PROVIDER_SITE_OTHER): Payer: Medicaid Other | Admitting: Obstetrics and Gynecology

## 2020-01-11 VITALS — BP 109/74 | HR 92 | Wt 235.5 lb

## 2020-01-11 DIAGNOSIS — O10919 Unspecified pre-existing hypertension complicating pregnancy, unspecified trimester: Secondary | ICD-10-CM

## 2020-01-11 DIAGNOSIS — O09529 Supervision of elderly multigravida, unspecified trimester: Secondary | ICD-10-CM

## 2020-01-11 DIAGNOSIS — O09522 Supervision of elderly multigravida, second trimester: Secondary | ICD-10-CM

## 2020-01-11 DIAGNOSIS — O24919 Unspecified diabetes mellitus in pregnancy, unspecified trimester: Secondary | ICD-10-CM

## 2020-01-11 MED ORDER — ACCU-CHEK GUIDE W/DEVICE KIT: 1.0000 | PACK | Freq: Four times a day (QID) | 0 refills | Status: AC

## 2020-01-11 MED ORDER — ACCU-CHEK GUIDE VI STRP: ORAL_STRIP | 12 refills | Status: DC

## 2020-01-11 MED ORDER — ACCU-CHEK SOFTCLIX LANCETS MISC: 12 refills | Status: AC

## 2020-01-11 NOTE — Progress Notes (Signed)
Subjective:  Michelle Houston is a 39 y.o. [id number] at [redacted]w[redacted]d being seen today for ongoing prenatal care.  She is currently monitored for the following issues for this high-risk pregnancy and has Chronic hypertension; DM (diabetes mellitus) (HCC); S/P cholecystectomy; History of bladder repair surgery; H/O carpal tunnel repair; Obese; AMA (advanced maternal age) multigravida 35+; Diabetes mellitus affecting pregnancy, antepartum; Chronic hypertension during pregnancy, antepartum; Encounter for supervision of high risk multigravida of advanced maternal age, antepartum; and Maternal obesity affecting pregnancy, antepartum on their problem list.  Patient reports no complaints.  Contractions: Not present. Vag. Bleeding: None.   . Denies leaking of fluid.   The following portions of the patient's history were reviewed and updated as appropriate: allergies, current medications, past family history, past medical history, past social history, past surgical history and problem list. Problem list updated.  Objective:   Vitals:   01/11/20 0947  BP: 109/74  Pulse: 92  Weight: 235 lb 8 oz (106.8 kg)    Fetal Status: Fetal Heart Rate (bpm): 155         General:  Alert, oriented and cooperative. Patient is in no acute distress.  Skin: Skin is warm and dry. No rash noted.   Cardiovascular: Normal heart rate noted  Respiratory: Normal respiratory effort, no problems with respiration noted  Abdomen: Soft, gravid, appropriate for gestational age. Pain/Pressure: Absent     Pelvic:  Cervical exam deferred        Extremities: Normal range of motion.  Edema: None  Mental Status: Normal mood and affect. Normal behavior. Normal judgment and thought content.   Urinalysis:      Assessment and Plan:  Pregnancy: [id number] at [redacted]w[redacted]d  1. Diabetes mellitus affecting pregnancy, antepartum Has not been faithful in checking CBG's When she does that after meals and in the 120's. Importance of checking CBG's, goals and  exersize dicussed. DM educator appt next week - US Fetal Echocardiography; Future  2. Encounter for supervision of high risk multigravida of advanced maternal age, antepartum Stable AFP next visit Anatomy scan ordered  3. Chronic hypertension during pregnancy, antepartum BP stable Continue with Labetalol and BASA  4. Multigravida of advanced maternal age in second trimester Stable  Preterm labor symptoms and general obstetric precautions including but not limited to vaginal bleeding, contractions, leaking of fluid and fetal movement were reviewed in detail with the patient. Please refer to After Visit Summary for other counseling recommendations.  Return in about 4 weeks (around 02/08/2020) for OB visit, face to face, MD only.   Hermina Staggers, MD

## 2020-01-11 NOTE — Addendum Note (Signed)
Addended by: Eldred Manges on: 01/11/2020 10:20 AM   Modules accepted: Orders

## 2020-01-11 NOTE — Patient Instructions (Signed)

## 2020-01-11 NOTE — Progress Notes (Signed)
Pt is here for ROB, [redacted]w[redacted]d.

## 2020-01-20 ENCOUNTER — Ambulatory Visit: Payer: Medicaid Other | Admitting: Dietician

## 2020-02-10 ENCOUNTER — Other Ambulatory Visit: Payer: Self-pay

## 2020-02-10 ENCOUNTER — Telehealth: Payer: Self-pay

## 2020-02-10 NOTE — Telephone Encounter (Signed)
Pt called requesting refill on Labetalol. Pharmacy is [facility]. Please advise.

## 2020-02-10 NOTE — Progress Notes (Signed)
error 

## 2020-02-11 ENCOUNTER — Other Ambulatory Visit: Payer: Self-pay | Admitting: Obstetrics and Gynecology

## 2020-02-11 MED ORDER — LABETALOL HCL 100 MG PO TABS
100.0000 mg | ORAL_TABLET | Freq: Every day | ORAL | 6 refills | Status: DC
Start: 2020-02-11 — End: 2020-05-24

## 2020-02-18 ENCOUNTER — Ambulatory Visit: Payer: Medicaid Other | Admitting: *Deleted

## 2020-02-18 ENCOUNTER — Other Ambulatory Visit: Payer: Self-pay | Admitting: *Deleted

## 2020-02-18 ENCOUNTER — Other Ambulatory Visit: Payer: Self-pay

## 2020-02-18 ENCOUNTER — Ambulatory Visit: Payer: Medicaid Other | Attending: Obstetrics and Gynecology

## 2020-02-18 DIAGNOSIS — Z148 Genetic carrier of other disease: Secondary | ICD-10-CM

## 2020-02-18 DIAGNOSIS — O99210 Obesity complicating pregnancy, unspecified trimester: Secondary | ICD-10-CM | POA: Insufficient documentation

## 2020-02-18 DIAGNOSIS — O09522 Supervision of elderly multigravida, second trimester: Secondary | ICD-10-CM

## 2020-02-18 DIAGNOSIS — Z363 Encounter for antenatal screening for malformations: Secondary | ICD-10-CM

## 2020-02-18 DIAGNOSIS — O99212 Obesity complicating pregnancy, second trimester: Secondary | ICD-10-CM

## 2020-02-18 DIAGNOSIS — O10919 Unspecified pre-existing hypertension complicating pregnancy, unspecified trimester: Secondary | ICD-10-CM | POA: Insufficient documentation

## 2020-02-18 DIAGNOSIS — E119 Type 2 diabetes mellitus without complications: Secondary | ICD-10-CM

## 2020-02-18 DIAGNOSIS — E669 Obesity, unspecified: Secondary | ICD-10-CM

## 2020-02-18 DIAGNOSIS — O24112 Pre-existing diabetes mellitus, type 2, in pregnancy, second trimester: Secondary | ICD-10-CM | POA: Diagnosis not present

## 2020-02-18 DIAGNOSIS — O10912 Unspecified pre-existing hypertension complicating pregnancy, second trimester: Secondary | ICD-10-CM | POA: Diagnosis not present

## 2020-02-22 ENCOUNTER — Ambulatory Visit (INDEPENDENT_AMBULATORY_CARE_PROVIDER_SITE_OTHER): Payer: Medicaid Other | Admitting: Obstetrics and Gynecology

## 2020-02-22 ENCOUNTER — Other Ambulatory Visit: Payer: Self-pay

## 2020-02-22 ENCOUNTER — Encounter: Payer: Self-pay | Admitting: Obstetrics and Gynecology

## 2020-02-22 VITALS — BP 105/71 | HR 98 | Wt 234.0 lb

## 2020-02-22 DIAGNOSIS — O09522 Supervision of elderly multigravida, second trimester: Secondary | ICD-10-CM

## 2020-02-22 DIAGNOSIS — O09529 Supervision of elderly multigravida, unspecified trimester: Secondary | ICD-10-CM

## 2020-02-22 DIAGNOSIS — O10919 Unspecified pre-existing hypertension complicating pregnancy, unspecified trimester: Secondary | ICD-10-CM

## 2020-02-22 DIAGNOSIS — O24919 Unspecified diabetes mellitus in pregnancy, unspecified trimester: Secondary | ICD-10-CM

## 2020-02-22 NOTE — Progress Notes (Signed)
   PRENATAL VISIT NOTE  Subjective:  Michelle Houston is a 39 y.o. [phone] at [redacted]w[redacted]d being seen today for ongoing prenatal care.  She is currently monitored for the following issues for this high-risk pregnancy and has Chronic hypertension; DM (diabetes mellitus) (HCC); S/P cholecystectomy; History of bladder repair surgery; H/O carpal tunnel repair; Obese; AMA (advanced maternal age) multigravida 35+; Diabetes mellitus affecting pregnancy, antepartum; Chronic hypertension during pregnancy, antepartum; Encounter for supervision of high risk multigravida of advanced maternal age, antepartum; and Maternal obesity affecting pregnancy, antepartum on their problem list.  Patient reports no complaints.  Contractions: Not present. Vag. Bleeding: None.  Movement: Present. Denies leaking of fluid.   The following portions of the patient's history were reviewed and updated as appropriate: allergies, current medications, past family history, past medical history, past social history, past surgical history and problem list.   Objective:   Vitals:   02/22/20 0924  BP: 105/71  Pulse: 98    Fetal Status: Fetal Heart Rate (bpm): 153 Fundal Height: 20 cm Movement: Present     General:  Alert, oriented and cooperative. Patient is in no acute distress.  Skin: Skin is warm and dry. No rash noted.   Cardiovascular: Normal heart rate noted  Respiratory: Normal respiratory effort, no problems with respiration noted  Abdomen: Soft, gravid, appropriate for gestational age.  Pain/Pressure: Present     Pelvic: Cervical exam deferred        Extremities: Normal range of motion.  Edema: None  Mental Status: Normal mood and affect. Normal behavior. Normal judgment and thought content.   Assessment and Plan:  Pregnancy: [id number] at [redacted]w[redacted]d 1. Encounter for supervision of high risk multigravida of advanced maternal age, antepartum Patient is doing well without complaints - AFP, Serum, Open Spina Bifida  2. Chronic  hypertension during pregnancy, antepartum Continue ASA and labetalol  3. Diabetes mellitus affecting pregnancy, antepartum Patient did not bring meter or log but reports fasting no higher than 96. She admits to elevated pp due to dietary indiscretions. She missed her previous appointment with nutritionist due to power outage. We will reschedule. Advise patient to keep a dietary log to improve on her food choices Follow up anatomy and fetal echo ordered - Referral to Nutrition and Diabetes Services  4. Multigravida of advanced maternal age in second trimester Low risks NIPS Patient declined genetic counseling for alpha thal carrier  Preterm labor symptoms and general obstetric precautions including but not limited to vaginal bleeding, contractions, leaking of fluid and fetal movement were reviewed in detail with the patient. Please refer to After Visit Summary for other counseling recommendations.   Return in about 2 weeks (around 03/07/2020) for in person, ROB, High risk.  Future Appointments  Date Time Provider Department Center  03/17/2020  9:45 AM [facility] NURSE [facility]  03/17/2020 10:00 AM [facility] US1 [facility]    Catalina Antigua, MD

## 2020-02-22 NOTE — Progress Notes (Addendum)
ROB, wants to discuss genetic screening results.  Declined FLU vaccine.

## 2020-02-24 LAB — AFP, SERUM, OPEN SPINA BIFIDA
AFP MoM: 1.59
AFP Value: 79.7 ng/mL
Gest. Age on Collection Date: 20.3 weeks
Maternal Age At EDD: 40.8 yr
OSBR Risk 1 IN: 4310
Test Results:: NEGATIVE
Weight: 234 [lb_av]

## 2020-03-06 ENCOUNTER — Encounter (HOSPITAL_COMMUNITY): Payer: Self-pay | Admitting: *Deleted

## 2020-03-06 ENCOUNTER — Other Ambulatory Visit: Payer: Self-pay

## 2020-03-06 ENCOUNTER — Ambulatory Visit (HOSPITAL_COMMUNITY)
Admission: EM | Admit: 2020-03-06 | Discharge: 2020-03-06 | Disposition: A | Discharge status: Home / Self Care | Payer: Medicaid Other | Attending: Emergency Medicine | Admitting: Emergency Medicine

## 2020-03-06 DIAGNOSIS — R062 Wheezing: Secondary | ICD-10-CM | POA: Diagnosis not present

## 2020-03-06 DIAGNOSIS — J40 Bronchitis, not specified as acute or chronic: Secondary | ICD-10-CM | POA: Diagnosis not present

## 2020-03-06 HISTORY — DX: Type 2 diabetes mellitus without complications: E11.9

## 2020-03-06 MED ORDER — METHYLPREDNISOLONE SODIUM SUCC 125 MG IJ SOLR
125.0000 mg | Freq: Once | INTRAMUSCULAR | Status: AC
  Administered 2020-03-06: 125 mg via INTRAMUSCULAR

## 2020-03-06 MED ORDER — PREDNISONE 20 MG PO TABS: 40.0000 mg | ORAL_TABLET | Freq: Every day | ORAL | 0 refills | Status: AC

## 2020-03-06 MED ORDER — IPRATROPIUM-ALBUTEROL 0.5-2.5 (3) MG/3ML IN SOLN: 3.0000 mL | RESPIRATORY_TRACT | 0 refills | Status: AC | PRN

## 2020-03-06 MED ORDER — METHYLPREDNISOLONE SODIUM SUCC 125 MG IJ SOLR
INTRAMUSCULAR | Status: AC
  Filled 2020-03-06: qty 2

## 2020-03-06 NOTE — Discharge Instructions (Signed)
We gave you a dose of Solu-Medrol continue with prednisone daily for 5 days with food, take tomorrow morning I provided nebulizers to use as needed for shortness of breath wheezing Continue to monitor sugars and stay in close contact with OB/GYN Follow-up if not improving or worsening

## 2020-03-06 NOTE — ED Triage Notes (Signed)
Pt is [redacted] wks pregnant. C/O SOB and wheezing onset 5 days ago; went to PCP 4 days ago - given abx and inhaler for bronchitis.  Had negative Covid test. States SOB and wheezing is getting worse, but inhaler no longer working.  States needs Rx for neb med.

## 2020-03-06 NOTE — ED Provider Notes (Signed)
[facility]    CSN: [id number] Arrival date & time: 03/06/20  1242      History   Chief Complaint Chief Complaint  Patient presents with   Cough   Wheezing    HPI Michelle Houston is a 39 y.o. female 22 weeks 2 days pregnant, history of DM type II, hypertension and PCOS presenting today for evaluation of cough and wheezing.  Reports she frequently gets bronchitis when she has URI symptoms.  Previously seen by primary and had negative Covid testing, also was started on azithromycin and took last dose today.  Reports wheezing and shortness of breath is worsened.  Using albuterol inhaler without relief.  HPI  Past Medical History:  Diagnosis Date   Bronchitis    Diabetes mellitus without complication ([place])    "being watched for it"   Hypertension    Obesity    PCOS (polycystic ovarian syndrome)    Pneumonia     Patient Active Problem List   Diagnosis Date Noted   Maternal obesity affecting pregnancy, antepartum 12/14/2019   Encounter for supervision of high risk multigravida of advanced maternal age, antepartum 12/07/2019   Chronic hypertension during pregnancy, antepartum 11/14/2016   Diabetes mellitus affecting pregnancy, antepartum 10/17/2016   AMA (advanced maternal age) multigravida 35+ 09/30/2016   DM (diabetes mellitus) ([place]) 09/19/2016   S/P cholecystectomy 09/19/2016   History of bladder repair surgery 09/19/2016   H/O carpal tunnel repair 09/19/2016   Obese 09/19/2016   Chronic hypertension 10/13/2013    Past Surgical History:  Procedure Laterality Date   BLADDER REPAIR     CARPAL TUNNEL RELEASE     CHOLECYSTECTOMY      OB History    Gravida  6   Para  4   Term  4   Preterm      AB  1   Living  4     SAB  1   TAB      Ectopic      Multiple  0   Live Births  4            Home Medications    Prior to Admission medications   Medication Sig Start Date End Date Taking? Authorizing Provider    aspirin EC 81 MG tablet Take 1 tablet (81 mg total) by mouth daily. Take after 12 weeks for prevention of preeclampsia later in pregnancy 12/14/19  Yes Constant, Peggy, MD  labetalol (NORMODYNE) 100 MG tablet Take 1 tablet (100 mg total) by mouth at bedtime. 02/11/20  Yes Constant, Peggy, MD  Prenatal Vit-Fe Fumarate-FA (PRENATAL MULTIVITAMIN) TABS tablet Take 1 tablet by mouth daily at 12 noon.   Yes [provider]  Accu-Chek Softclix Lancets lancets Use as instructed 01/11/20   Chancy Milroy, MD  Blood Glucose Monitoring Suppl (ACCU-CHEK GUIDE) w/Device KIT 1 Device by Does not apply route 4 (four) times daily. 01/11/20   Chancy Milroy, MD  glucose blood (ACCU-CHEK GUIDE) test strip Use to check blood sugars four times a day was instructed 01/11/20   Chancy Milroy, MD  ipratropium-albuterol (DUONEB) 0.5-2.5 (3) MG/3ML SOLN Take 3 mLs by nebulization every 4 (four) hours as needed. 03/06/20   Jarl Sellitto C, PA-C  predniSONE (DELTASONE) 20 MG tablet Take 2 tablets (40 mg total) by mouth daily with breakfast for 5 days. 03/06/20 03/11/20  Kenniya Westrich C, PA-C  albuterol (PROVENTIL HFA;VENTOLIN HFA) 108 (90 Base) MCG/ACT inhaler Inhale 2 puffs into the lungs every 6 (  six) hours as needed for wheezing or shortness of breath. Patient not taking: Reported on 01/01/2019 06/25/18 05/05/19  Suella Broad A, PA-C  fluticasone [facility]) 50 MCG/ACT nasal spray Place 1 spray into both nostrils daily. Patient not taking: Reported on 01/01/2019 06/25/18 05/05/19  Tacy Learn, PA-C    Family History Family History  Problem Relation Age of Onset   Cancer Maternal Grandmother    Hypertension Mother    Fibroids Mother    Diabetes Paternal Aunt    Stroke Maternal Grandfather    Diabetes Paternal Grandmother    Stroke Paternal Grandmother    Cancer Father        lung   Heart disease Paternal Grandfather     Social History Social History   Tobacco Use   Smoking status:  Current Every Day Smoker    Packs/day: 1.00    Years: 18.00    Pack years: 18.00    Types: Cigarettes   Smokeless tobacco: Never Used   Tobacco comment: 10 a day  Vaping Use   Vaping Use: Never used  Substance Use Topics   Alcohol use: No   Drug use: No     Allergies   Patient has no known allergies.   Review of Systems Review of Systems  Constitutional: Negative for activity change, appetite change, chills, fatigue and fever.  HENT: Positive for congestion and rhinorrhea. Negative for ear pain, sinus pressure, sore throat and trouble swallowing.   Eyes: Negative for discharge and redness.  Respiratory: Positive for cough, shortness of breath and wheezing. Negative for chest tightness.   Cardiovascular: Negative for chest pain.  Gastrointestinal: Negative for abdominal pain, diarrhea, nausea and vomiting.  Musculoskeletal: Negative for myalgias.  Skin: Negative for rash.  Neurological: Negative for dizziness, light-headedness and headaches.     Physical Exam Triage Vital Signs ED Triage Vitals  Enc Vitals Group     BP      Pulse      Resp      Temp      Temp src      SpO2      Weight      Height      Head Circumference      Peak Flow      Pain Score      Pain Loc      Pain Edu?      Excl. in [place]?    No data found.  Updated Vital Signs BP 131/69    Pulse (!) 108    Temp 98.3 F (36.8 C) (Oral)    Resp (!) 22    LMP 10/02/2019    SpO2 98%   Visual Acuity Right Eye Distance:   Left Eye Distance:   Bilateral Distance:    Right Eye Near:   Left Eye Near:    Bilateral Near:     Physical Exam Vitals and nursing note reviewed.  Constitutional:      Appearance: She is well-developed.     Comments: No acute distress  HENT:     Head: Normocephalic and atraumatic.     Ears:     Comments: Bilateral ears without tenderness to palpation of external auricle, tragus and mastoid, EAC's without erythema or swelling, TM's with good bony landmarks and cone of  light. Non erythematous.     Nose: Nose normal.     Mouth/Throat:     Comments: Oral mucosa pink and moist, no tonsillar enlargement or exudate. Posterior pharynx patent and nonerythematous, no  uvula deviation or swelling. Normal phonation. Eyes:     Conjunctiva/sclera: Conjunctivae normal.  Cardiovascular:     Rate and Rhythm: Normal rate.  Pulmonary:     Effort: Pulmonary effort is normal. No respiratory distress.     Comments: Significant wheezing and rhonchi with inspiration and expiration throughout bilateral lung fields Abdominal:     General: There is no distension.  Musculoskeletal:        General: Normal range of motion.     Cervical back: Neck supple.  Skin:    General: Skin is warm and dry.  Neurological:     Mental Status: She is alert and oriented to person, place, and time.      UC Treatments / Results  Labs (all labs ordered are listed, but only abnormal results are displayed) Labs Reviewed - No data to display  EKG   Radiology No results found.  Procedures Procedures (including critical care time)  Medications Ordered in UC Medications  methylPREDNISolone sodium succinate (SOLU-MEDROL) 125 mg/2 mL injection 125 mg (has no administration in time range)    Initial Impression / Assessment and Plan / UC Course  I have reviewed the triage vital signs and the nursing notes.  Pertinent labs & imaging results that were available during my care of the patient were reviewed by me and considered in my medical decision making (see chart for details).     [redacted] weeks pregnant, providing dose of Solu-Medrol and refilling DuoNeb's, continuing on prednisone for 4 to 5 days.  Monitor sugars, stay in close contact with OB/GYN.  Discussed strict return precautions. Patient verbalized understanding and is agreeable with plan.  Final Clinical Impressions(s) / UC Diagnoses   Final diagnoses:  Bronchitis  Wheezing     Discharge Instructions     We gave you a  dose of Solu-Medrol continue with prednisone daily for 5 days with food, take tomorrow morning I provided nebulizers to use as needed for shortness of breath wheezing Continue to monitor sugars and stay in close contact with OB/GYN Follow-up if not improving or worsening    ED Prescriptions    Medication Sig Dispense Auth. Provider   ipratropium-albuterol (DUONEB) 0.5-2.5 (3) MG/3ML SOLN Take 3 mLs by nebulization every 4 (four) hours as needed. 360 mL Muriel Wilber C, PA-C   predniSONE (DELTASONE) 20 MG tablet Take 2 tablets (40 mg total) by mouth daily with breakfast for 5 days. 10 tablet Avey Mcmanamon, [place] C, PA-C     PDMP not reviewed this encounter.   Janith Lima, PA-C 03/06/20 1521

## 2020-03-14 ENCOUNTER — Other Ambulatory Visit: Payer: Self-pay

## 2020-03-14 ENCOUNTER — Ambulatory Visit (INDEPENDENT_AMBULATORY_CARE_PROVIDER_SITE_OTHER): Payer: Medicaid Other | Admitting: Advanced Practice Midwife

## 2020-03-14 VITALS — BP 112/74 | HR 95 | Wt 235.0 lb

## 2020-03-14 DIAGNOSIS — Z7189 Other specified counseling: Secondary | ICD-10-CM

## 2020-03-14 DIAGNOSIS — Z7984 Long term (current) use of oral hypoglycemic drugs: Secondary | ICD-10-CM

## 2020-03-14 DIAGNOSIS — O10912 Unspecified pre-existing hypertension complicating pregnancy, second trimester: Secondary | ICD-10-CM

## 2020-03-14 DIAGNOSIS — E119 Type 2 diabetes mellitus without complications: Secondary | ICD-10-CM

## 2020-03-14 DIAGNOSIS — O24112 Pre-existing diabetes mellitus, type 2, in pregnancy, second trimester: Secondary | ICD-10-CM

## 2020-03-14 DIAGNOSIS — Z3A23 23 weeks gestation of pregnancy: Secondary | ICD-10-CM | POA: Diagnosis not present

## 2020-03-14 DIAGNOSIS — O09529 Supervision of elderly multigravida, unspecified trimester: Secondary | ICD-10-CM

## 2020-03-14 DIAGNOSIS — O09522 Supervision of elderly multigravida, second trimester: Secondary | ICD-10-CM

## 2020-03-14 DIAGNOSIS — O10919 Unspecified pre-existing hypertension complicating pregnancy, unspecified trimester: Secondary | ICD-10-CM

## 2020-03-14 MED ORDER — METFORMIN HCL 500 MG PO TABS: 500.0000 mg | ORAL_TABLET | Freq: Two times a day (BID) | ORAL | 2 refills | Status: DC

## 2020-03-14 NOTE — Patient Instructions (Signed)

## 2020-03-14 NOTE — Progress Notes (Signed)
PRENATAL VISIT NOTE  Subjective:  Michelle Houston is a 39 y.o. [phone] at [redacted]w[redacted]d being seen today for ongoing prenatal care.  She is currently monitored for the following issues for this high-risk pregnancy and has Chronic hypertension; DM (diabetes mellitus) (HCC); S/P cholecystectomy; History of bladder repair surgery; H/O carpal tunnel repair; Obese; AMA (advanced maternal age) multigravida 35+; Diabetes mellitus affecting pregnancy, antepartum; Chronic hypertension during pregnancy, antepartum; Encounter for supervision of high risk multigravida of advanced maternal age, antepartum; and Maternal obesity affecting pregnancy, antepartum on their problem list.  Patient reports no complaints.  Contractions: Not present. Vag. Bleeding: None.  Movement: Present. Denies leaking of fluid.   The following portions of the patient's history were reviewed and updated as appropriate: allergies, current medications, past family history, past medical history, past social history, past surgical history and problem list.   Objective:   Vitals:   03/14/20 0946  BP: 112/74  Pulse: 95  Weight: 235 lb (106.6 kg)    Fetal Status: Fetal Heart Rate (bpm): 156   Movement: Present     General:  Alert, oriented and cooperative. Patient is in no acute distress.  Skin: Skin is warm and dry. No rash noted.   Cardiovascular: Normal heart rate noted  Respiratory: Normal respiratory effort, no problems with respiration noted  Abdomen: Soft, gravid, appropriate for gestational age.  Pain/Pressure: Present     Pelvic: Cervical exam deferred        Extremities: Normal range of motion.  Edema: None  Mental Status: Normal mood and affect. Normal behavior. Normal judgment and thought content.   Assessment and Plan:  Pregnancy: [id number] at [redacted]w[redacted]d 1. Encounter for supervision of high risk multigravida of advanced maternal age, antepartum --Anticipatory guidance about next visits/weeks of pregnancy given. --Pt  discussed interest in waterbirth but is CHTN and preexisting DM so is not a candidate for water immersion/intermittent FHR monitoring --Pt mostly expressed interest in seeing female only providers and does want to avoid epidural if possible. --Discussed options to support natural birth and to move around during labor with pt and questions answered. --Next visit in person in 2 weeks  COVID-19 Vaccine Counseling: The patient was counseled on the potential benefits and lack of known risks of COVID vaccination, during pregnancy and breastfeeding, during today's visit. The patient's questions and concerns were addressed today, including safety of the vaccination and potential side effects as they have been published by ACOG and [facility]. The patient has been informed that there have not been any documented vaccine related injuries, deaths or birth defects to infant or mom after receiving the COVID-19 vaccine to date. The patient has been made aware that although she is not at increased risk of contracting COVID-19 during pregnancy, she is at increased risk of developing severe disease and complications if she contracts COVID-19 while pregnant. All patient questions were addressed during our visit today. The patient is not planning to get vaccinated at this time.     2. Chronic hypertension during pregnancy, antepartum --BP well controlled on labetalol 100 mg BID  3. Type 2 diabetes mellitus without complication, without long-term current use of insulin (HCC) --Pt does not have log but is able to review numbers with provider.   --Fastings 95-110s and PP 120s-130s.  Discussed options including Metformin and insulin. Pt to start Metformin now, is open to using insulin as needed. --F/U with female MD in 2 weeks and bring glucose log  - metFORMIN (GLUCOPHAGE) 500 MG tablet; Take 1 tablet (  500 mg total) by mouth 2 (two) times daily with a meal.  Dispense: 60 tablet; Refill: 2  4. [redacted] weeks gestation of  pregnancy  Preterm labor symptoms and general obstetric precautions including but not limited to vaginal bleeding, contractions, leaking of fluid and fetal movement were reviewed in detail with the patient. Please refer to After Visit Summary for other counseling recommendations.   Return in about 2 weeks (around 03/28/2020).  Future Appointments  Date Time Provider Department Center  03/17/2020  9:45 AM [facility] NURSE [facility]  03/17/2020 10:00 AM [facility] US1 [facility]  03/29/2020  9:45 AM Johnny Bridge, MD [facility] None    Sharen Counter, CNM

## 2020-03-14 NOTE — Progress Notes (Addendum)
HOB, urine Glucose and Protein are Negative today. Patient wants to discuss Water birth.

## 2020-03-17 ENCOUNTER — Encounter: Payer: Self-pay | Admitting: *Deleted

## 2020-03-17 ENCOUNTER — Ambulatory Visit: Payer: Medicaid Other | Admitting: *Deleted

## 2020-03-17 ENCOUNTER — Ambulatory Visit: Payer: Medicaid Other | Attending: Obstetrics and Gynecology

## 2020-03-17 ENCOUNTER — Other Ambulatory Visit: Payer: Self-pay

## 2020-03-17 ENCOUNTER — Other Ambulatory Visit: Payer: Self-pay | Admitting: *Deleted

## 2020-03-17 DIAGNOSIS — O09522 Supervision of elderly multigravida, second trimester: Secondary | ICD-10-CM

## 2020-03-17 DIAGNOSIS — O10919 Unspecified pre-existing hypertension complicating pregnancy, unspecified trimester: Secondary | ICD-10-CM | POA: Diagnosis not present

## 2020-03-17 DIAGNOSIS — O10012 Pre-existing essential hypertension complicating pregnancy, second trimester: Secondary | ICD-10-CM | POA: Diagnosis not present

## 2020-03-17 DIAGNOSIS — O99210 Obesity complicating pregnancy, unspecified trimester: Secondary | ICD-10-CM | POA: Diagnosis present

## 2020-03-17 DIAGNOSIS — E119 Type 2 diabetes mellitus without complications: Secondary | ICD-10-CM | POA: Diagnosis not present

## 2020-03-17 DIAGNOSIS — Z3A23 23 weeks gestation of pregnancy: Secondary | ICD-10-CM

## 2020-03-17 DIAGNOSIS — O24112 Pre-existing diabetes mellitus, type 2, in pregnancy, second trimester: Secondary | ICD-10-CM | POA: Diagnosis not present

## 2020-03-17 DIAGNOSIS — Z363 Encounter for antenatal screening for malformations: Secondary | ICD-10-CM

## 2020-03-17 DIAGNOSIS — O99212 Obesity complicating pregnancy, second trimester: Secondary | ICD-10-CM

## 2020-03-17 DIAGNOSIS — Z148 Genetic carrier of other disease: Secondary | ICD-10-CM

## 2020-03-17 DIAGNOSIS — E669 Obesity, unspecified: Secondary | ICD-10-CM

## 2020-03-29 ENCOUNTER — Encounter: Payer: Medicaid Other | Admitting: Obstetrics and Gynecology

## 2020-04-04 ENCOUNTER — Encounter: Payer: Medicaid Other | Admitting: Obstetrics and Gynecology

## 2020-04-13 ENCOUNTER — Ambulatory Visit: Payer: Medicaid Other | Admitting: *Deleted

## 2020-04-13 ENCOUNTER — Ambulatory Visit: Payer: Medicaid Other | Attending: Obstetrics and Gynecology

## 2020-04-13 ENCOUNTER — Other Ambulatory Visit: Payer: Self-pay | Admitting: *Deleted

## 2020-04-13 ENCOUNTER — Encounter: Payer: Self-pay | Admitting: *Deleted

## 2020-04-13 ENCOUNTER — Other Ambulatory Visit: Payer: Self-pay

## 2020-04-13 DIAGNOSIS — O99210 Obesity complicating pregnancy, unspecified trimester: Secondary | ICD-10-CM

## 2020-04-13 DIAGNOSIS — O99212 Obesity complicating pregnancy, second trimester: Secondary | ICD-10-CM

## 2020-04-13 DIAGNOSIS — O09522 Supervision of elderly multigravida, second trimester: Secondary | ICD-10-CM | POA: Diagnosis not present

## 2020-04-13 DIAGNOSIS — O24112 Pre-existing diabetes mellitus, type 2, in pregnancy, second trimester: Secondary | ICD-10-CM

## 2020-04-13 DIAGNOSIS — O10012 Pre-existing essential hypertension complicating pregnancy, second trimester: Secondary | ICD-10-CM | POA: Diagnosis not present

## 2020-04-13 DIAGNOSIS — Z3A27 27 weeks gestation of pregnancy: Secondary | ICD-10-CM

## 2020-04-13 DIAGNOSIS — O10919 Unspecified pre-existing hypertension complicating pregnancy, unspecified trimester: Secondary | ICD-10-CM | POA: Diagnosis not present

## 2020-04-13 DIAGNOSIS — Z363 Encounter for antenatal screening for malformations: Secondary | ICD-10-CM

## 2020-04-13 DIAGNOSIS — E669 Obesity, unspecified: Secondary | ICD-10-CM

## 2020-04-13 DIAGNOSIS — E119 Type 2 diabetes mellitus without complications: Secondary | ICD-10-CM | POA: Diagnosis not present

## 2020-04-18 ENCOUNTER — Encounter: Payer: Medicaid Other | Admitting: Obstetrics & Gynecology

## 2020-04-19 ENCOUNTER — Other Ambulatory Visit: Payer: Self-pay

## 2020-04-19 ENCOUNTER — Ambulatory Visit (INDEPENDENT_AMBULATORY_CARE_PROVIDER_SITE_OTHER): Payer: Medicaid Other | Admitting: Obstetrics and Gynecology

## 2020-04-19 ENCOUNTER — Encounter: Payer: Self-pay | Admitting: Obstetrics and Gynecology

## 2020-04-19 VITALS — BP 115/77 | HR 105 | Wt 236.9 lb

## 2020-04-19 DIAGNOSIS — O10919 Unspecified pre-existing hypertension complicating pregnancy, unspecified trimester: Secondary | ICD-10-CM

## 2020-04-19 DIAGNOSIS — Z3A28 28 weeks gestation of pregnancy: Secondary | ICD-10-CM

## 2020-04-19 DIAGNOSIS — E119 Type 2 diabetes mellitus without complications: Secondary | ICD-10-CM

## 2020-04-19 DIAGNOSIS — O09529 Supervision of elderly multigravida, unspecified trimester: Secondary | ICD-10-CM

## 2020-04-19 NOTE — Progress Notes (Signed)
Pt presents for ROB  Pt c/o painful varicose vein behind L knee Pt forgot glucose log at home  PCP increased Metformin from 500 mg to 1000 mg  Pt declined all vaccines

## 2020-04-19 NOTE — Patient Instructions (Signed)

## 2020-04-19 NOTE — Progress Notes (Signed)
Subjective:  Michelle Houston is a 39 y.o. [phone] at [redacted]w[redacted]d being seen today for ongoing prenatal care.  She is currently monitored for the following issues for this high-risk pregnancy and has Chronic hypertension; DM (diabetes mellitus) (HCC); S/P cholecystectomy; History of bladder repair surgery; H/O carpal tunnel repair; Obese; AMA (advanced maternal age) multigravida 35+; Diabetes mellitus affecting pregnancy, antepartum; Chronic hypertension during pregnancy, antepartum; Encounter for supervision of high risk multigravida of advanced maternal age, antepartum; and Maternal obesity affecting pregnancy, antepartum on their problem list.  GDM: Patient taking metformin 500 mg tid.  The patient states that her PCP increased her metformin to 3 times per day.  Reports no hypoglycemic episodes.  Tolerating medication well Fasting: 110 this morning.  Patient forgot her glucose log. 2hr PP: Patient forgot her glucose log.    Patient reports no complaints.  Contractions: Not present. Vag. Bleeding: None.  Movement: Present. Denies leaking of fluid.   The following portions of the patient's history were reviewed and updated as appropriate: allergies, current medications, past family history, past medical history, past social history, past surgical history and problem list. Problem list updated.  Objective:   Vitals:   04/19/20 0844  BP: 115/77  Pulse: (!) 105  Weight: 236 lb 14.4 oz (107.5 kg)    Fetal Status: Fetal Heart Rate (bpm): 153 Fundal Height: 30 cm Movement: Present     General:  Alert, oriented and cooperative. Patient is in no acute distress.  Skin: Skin is warm and dry. No rash noted.   Cardiovascular: Normal heart rate noted  Respiratory: Normal respiratory effort, no problems with respiration noted  Abdomen: Soft, gravid, appropriate for gestational age. Pain/Pressure: Present     Pelvic: Vag. Bleeding: None     Cervical exam deferred        Extremities: Normal range of motion.   Edema: None  Mental Status: Normal mood and affect. Normal behavior. Normal judgment and thought content.   Urinalysis:      Assessment and Plan:  Pregnancy: [id number] at [redacted]w[redacted]d  1. Encounter for supervision of high risk multigravida of advanced maternal age, antepartum - Will check other 28 week labs today.    2. [redacted] weeks gestation of pregnancy   3. Chronic hypertension during pregnancy, antepartum - Patient to continue ASA. - Patient to continue Labetalol 100 mg bid.   - Will start weekly antenatal testing at 32 weeks. - PreEclampsia precautions given.  4. Type 2 diabetes mellitus without complication, without long-term current use of insulin (HCC) - Patient to continue metformin 500 mg tid. - Patient encouraged to allow the OB team to manage her diabetes while pregnant.  Patient agrees. - Patient given glucose log forms.  She agrees to bring in the log later this week. - Discussed with patient that we need to know her glucose levels to determine is she is controlled, or if there is a need for insulin.  She voiced understanding.   - Risks of uncontrolled diabetes in pregnancy discussed.   - Fetal echo within normal limits. - Patient for repeat growth scan 05/12/2020. - Will start weekly antenatal testing at 32 weeks.    Preterm labor symptoms and general obstetric precautions including but not limited to vaginal bleeding, contractions, leaking of fluid and fetal movement were reviewed in detail with the patient. Please refer to After Visit Summary for other counseling recommendations.  Return in about 2 weeks (around 05/03/2020) for HROB.   Johnny Bridge, MD

## 2020-04-20 LAB — CBC
Hematocrit: 36.5 % (ref 34.0–46.6)
Hemoglobin: 12.8 g/dL (ref 11.1–15.9)
MCH: 30.5 pg (ref 26.6–33.0)
MCHC: 35.1 g/dL (ref 31.5–35.7)
MCV: 87 fL (ref 79–97)
Platelets: 251 10*3/uL (ref 150–450)
RBC: 4.2 x10E6/uL (ref 3.77–5.28)
RDW: 13.1 % (ref 11.7–15.4)
WBC: 7.2 10*3/uL (ref 3.4–10.8)

## 2020-04-20 LAB — RPR: RPR Ser Ql: NONREACTIVE

## 2020-04-20 LAB — HIV ANTIBODY (ROUTINE TESTING W REFLEX): HIV Screen 4th Generation wRfx: NONREACTIVE

## 2020-05-03 ENCOUNTER — Other Ambulatory Visit: Payer: Self-pay

## 2020-05-03 ENCOUNTER — Ambulatory Visit (INDEPENDENT_AMBULATORY_CARE_PROVIDER_SITE_OTHER): Payer: Medicaid Other | Admitting: Obstetrics and Gynecology

## 2020-05-03 ENCOUNTER — Encounter: Payer: Self-pay | Admitting: Obstetrics and Gynecology

## 2020-05-03 VITALS — BP 112/77 | HR 109 | Wt 238.0 lb

## 2020-05-03 DIAGNOSIS — O09529 Supervision of elderly multigravida, unspecified trimester: Secondary | ICD-10-CM

## 2020-05-03 DIAGNOSIS — O99210 Obesity complicating pregnancy, unspecified trimester: Secondary | ICD-10-CM

## 2020-05-03 DIAGNOSIS — O10919 Unspecified pre-existing hypertension complicating pregnancy, unspecified trimester: Secondary | ICD-10-CM

## 2020-05-03 DIAGNOSIS — O24119 Pre-existing diabetes mellitus, type 2, in pregnancy, unspecified trimester: Secondary | ICD-10-CM

## 2020-05-03 DIAGNOSIS — Z7984 Long term (current) use of oral hypoglycemic drugs: Secondary | ICD-10-CM

## 2020-05-03 NOTE — Progress Notes (Signed)
HOB, reports no problems today. 

## 2020-05-03 NOTE — Progress Notes (Signed)
Subjective:  Michelle Houston is a 39 y.o. [id number] at [redacted]w[redacted]d being seen today for ongoing prenatal care.  She is currently monitored for the following issues for this high-risk pregnancy and has Chronic hypertension; DM (diabetes mellitus) (HCC); S/P cholecystectomy; History of bladder repair surgery; H/O carpal tunnel repair; Obese; AMA (advanced maternal age) multigravida 35+; Diabetes mellitus affecting pregnancy, antepartum; Chronic hypertension during pregnancy, antepartum; Encounter for supervision of high risk multigravida of advanced maternal age, antepartum; and Maternal obesity affecting pregnancy, antepartum on their problem list.  GDM: Patient taking Metformin 500 mg bid.  Reports No hypoglycemic episodes.  Tolerating medication well Fasting: 93 - 119 2hr PP: 102 - 148  Patient reports no complaints.  Contractions: Not present. Vag. Bleeding: None.  Movement: Present. Denies leaking of fluid.   The following portions of the patient's history were reviewed and updated as appropriate: allergies, current medications, past family history, past medical history, past social history, past surgical history and problem list. Problem list updated.  Objective:   Vitals:   05/03/20 0940  BP: 112/77  Pulse: (!) 109  Weight: 238 lb (108 kg)    Fetal Status: Fetal Heart Rate (bpm): 160 Fundal Height: 32 cm Movement: Present     General:  Alert, oriented and cooperative. Patient is in no acute distress.  Skin: Skin is warm and dry. No rash noted.   Cardiovascular: Normal heart rate noted  Respiratory: Normal respiratory effort, no problems with respiration noted  Abdomen: Soft, gravid, appropriate for gestational age. Pain/Pressure: Present     Pelvic: Vag. Bleeding: None     Cervical exam deferred        Extremities: Normal range of motion.  Edema: None  Mental Status: Normal mood and affect. Normal behavior. Normal judgment and thought content.   Urinalysis:      Assessment and Plan:   Pregnancy: [id number] at [redacted]w[redacted]d  1. Encounter for supervision of high risk multigravida of advanced maternal age, antepartum   2. Chronic hypertension during pregnancy, antepartum - Continue labetalol and ASA. - Patient scheduled for growth scan on 05/12/2020. - Patient to begin weekly antenatal testing at 32 weeks.  3. Pre-existing type 2 diabetes mellitus treated with oral hypoglycemic therapy during pregnancy - Insulin therapy recommended.  Patient declines. - We will increase the metformin to 500 mg tid.  Patient will begin walking 20-30 minutes prior to dinner. - Growth scan scheduled for 05/12/2020. - Weekly antenatal testing to begin at 32 weeks.    4. Maternal obesity affecting pregnancy, antepartum - Patient to begin walking prior to dinner.  Preterm labor symptoms and general obstetric precautions including but not limited to vaginal bleeding, contractions, leaking of fluid and fetal movement were reviewed in detail with the patient. Please refer to After Visit Summary for other counseling recommendations.  Return in about 2 weeks (around 05/17/2020) for HROB.   Johnny Bridge, MD

## 2020-05-12 ENCOUNTER — Ambulatory Visit: Payer: Medicaid Other | Attending: Obstetrics and Gynecology

## 2020-05-12 ENCOUNTER — Encounter: Payer: Self-pay | Admitting: *Deleted

## 2020-05-12 ENCOUNTER — Other Ambulatory Visit: Payer: Self-pay

## 2020-05-12 ENCOUNTER — Ambulatory Visit: Payer: Medicaid Other | Admitting: *Deleted

## 2020-05-12 DIAGNOSIS — O24113 Pre-existing diabetes mellitus, type 2, in pregnancy, third trimester: Secondary | ICD-10-CM | POA: Diagnosis not present

## 2020-05-12 DIAGNOSIS — O10013 Pre-existing essential hypertension complicating pregnancy, third trimester: Secondary | ICD-10-CM

## 2020-05-12 DIAGNOSIS — Z3A31 31 weeks gestation of pregnancy: Secondary | ICD-10-CM

## 2020-05-12 DIAGNOSIS — O09523 Supervision of elderly multigravida, third trimester: Secondary | ICD-10-CM

## 2020-05-12 DIAGNOSIS — O99213 Obesity complicating pregnancy, third trimester: Secondary | ICD-10-CM | POA: Diagnosis not present

## 2020-05-12 DIAGNOSIS — Z148 Genetic carrier of other disease: Secondary | ICD-10-CM

## 2020-05-12 DIAGNOSIS — O99210 Obesity complicating pregnancy, unspecified trimester: Secondary | ICD-10-CM | POA: Insufficient documentation

## 2020-05-12 DIAGNOSIS — E119 Type 2 diabetes mellitus without complications: Secondary | ICD-10-CM

## 2020-05-12 DIAGNOSIS — O10919 Unspecified pre-existing hypertension complicating pregnancy, unspecified trimester: Secondary | ICD-10-CM | POA: Diagnosis not present

## 2020-05-17 ENCOUNTER — Encounter: Payer: Self-pay | Admitting: *Deleted

## 2020-05-17 ENCOUNTER — Other Ambulatory Visit: Payer: Self-pay | Admitting: *Deleted

## 2020-05-17 ENCOUNTER — Other Ambulatory Visit: Payer: Self-pay

## 2020-05-17 ENCOUNTER — Other Ambulatory Visit: Payer: Self-pay | Admitting: Obstetrics and Gynecology

## 2020-05-17 ENCOUNTER — Ambulatory Visit: Payer: Medicaid Other | Admitting: *Deleted

## 2020-05-17 ENCOUNTER — Ambulatory Visit (INDEPENDENT_AMBULATORY_CARE_PROVIDER_SITE_OTHER): Payer: Medicaid Other | Admitting: Obstetrics and Gynecology

## 2020-05-17 ENCOUNTER — Ambulatory Visit: Payer: Medicaid Other | Attending: Obstetrics and Gynecology

## 2020-05-17 VITALS — BP 114/79 | HR 104 | Temp 98.5°F | Wt 238.2 lb

## 2020-05-17 DIAGNOSIS — O99210 Obesity complicating pregnancy, unspecified trimester: Secondary | ICD-10-CM

## 2020-05-17 DIAGNOSIS — O10919 Unspecified pre-existing hypertension complicating pregnancy, unspecified trimester: Secondary | ICD-10-CM | POA: Diagnosis present

## 2020-05-17 DIAGNOSIS — O09523 Supervision of elderly multigravida, third trimester: Secondary | ICD-10-CM

## 2020-05-17 DIAGNOSIS — E119 Type 2 diabetes mellitus without complications: Secondary | ICD-10-CM | POA: Diagnosis not present

## 2020-05-17 DIAGNOSIS — O99213 Obesity complicating pregnancy, third trimester: Secondary | ICD-10-CM | POA: Diagnosis not present

## 2020-05-17 DIAGNOSIS — Z148 Genetic carrier of other disease: Secondary | ICD-10-CM

## 2020-05-17 DIAGNOSIS — O10013 Pre-existing essential hypertension complicating pregnancy, third trimester: Secondary | ICD-10-CM

## 2020-05-17 DIAGNOSIS — O09529 Supervision of elderly multigravida, unspecified trimester: Secondary | ICD-10-CM

## 2020-05-17 DIAGNOSIS — Z3A32 32 weeks gestation of pregnancy: Secondary | ICD-10-CM

## 2020-05-17 DIAGNOSIS — O10913 Unspecified pre-existing hypertension complicating pregnancy, third trimester: Secondary | ICD-10-CM

## 2020-05-17 DIAGNOSIS — O24113 Pre-existing diabetes mellitus, type 2, in pregnancy, third trimester: Secondary | ICD-10-CM | POA: Diagnosis not present

## 2020-05-17 DIAGNOSIS — O24919 Unspecified diabetes mellitus in pregnancy, unspecified trimester: Secondary | ICD-10-CM

## 2020-05-17 DIAGNOSIS — Z6841 Body Mass Index (BMI) 40.0 and over, adult: Secondary | ICD-10-CM

## 2020-05-17 NOTE — Patient Instructions (Signed)
Type 1 or Type 2 Diabetes Mellitus During Pregnancy, Self Care When you have type 1 or type 2 diabetes (diabetes mellitus), you must keep your blood sugar (glucose) in a healthy range. You can do this with:  Nutrition.  Exercise.  Lifestyle changes.  Insulin or medicines, if needed.  Support from your doctors and others. If diabetes is treated, it is unlikely to cause problems for the mother or baby. If it is not treated, it may cause problems that can be harmful to the mother and baby. How to stay aware of blood sugar   Check your blood sugar every day, as often as told.  Call your doctor if your blood sugar is above your goal numbers for 2 tests in a row.  Have your A1c (hemoglobin A1c) level checked at least two times a year. Have it checked more often if your doctor tells you to do that. Your doctor will set personal treatment goals for you. In general, you should have these blood sugar levels:  After not eating for a long time (fasting): 95 mg/dL (5.3 mmol/L).  After meals (postprandial): ? One hour after a meal: at or below 140 mg/dL (7.8 mmol/L). ? Two hours after a meal: at or below 120 mg/dL (6.7 mmol/L).  A1c level: 6-6.5%. How to manage high and low blood sugar Signs of high blood sugar High blood sugar is called hyperglycemia. Know the early signs of high blood sugar. Signs may include:  Feeling: ? Thirsty. ? Hungry. ? Very tired.  Needing to pee (urinate) more than usual.  Blurry vision. Signs of low blood sugar Low blood sugar is called hypoglycemia. This is when blood sugar is at or below 70 mg/dL (3.9 mmol/L). Signs may include:  Feeling: ? Hungry. ? Worried or nervous (anxious). ? Sweaty and clammy. ? Confused. ? Dizzy. ? Sleepy. ? Sick to your stomach (nauseous).  Having: ? A fast heartbeat. ? A headache. ? A change in your vision. ? Tingling or no feeling (numbness) around your mouth, lips, or tongue. ? Jerky movements that you cannot  control (seizure).  Having trouble with: ? Moving (coordination). ? Sleeping. ? Passing out (fainting). ? Getting upset easily (irritability). Treating low blood sugar To treat low blood sugar, eat or drink something sugary right away. If you can think clearly and swallow safely, follow the 15:15 rule:  Take 15 grams of a fast-acting carb (carbohydrate). Some fast-acting carbs are: ? 1 tube of glucose gel. ? 3 sugar tablets (glucose pills). ? 6-8 pieces of hard candy. ? 4 oz (120 mL) of fruit juice. ? 4 oz (120 mL) of regular (not diet) soda.  Check your blood sugar 15 minutes after you take the carb.  If your blood sugar is still at or below 70 mg/dL (3.9 mmol/L), take 15 grams of a carb again.  If your blood sugar does not go above 70 mg/dL (3.9 mmol/L) after 3 tries, get help right away.  After your blood sugar goes back to normal, eat a meal or a snack within 1 hour. Treating very low blood sugar If your blood sugar is at or below 54 mg/dL (3 mmol/L), you have very low blood sugar (severe hypoglycemia). This is an emergency. Do not wait to see if the symptoms will go away. Get medical help right away. Call your local emergency services (911 in the [place] If you have very low blood sugar and you cannot eat or drink, you may need a glucagon shot (injection). A  family member or friend should learn how to check your blood sugar and how to give you a glucagon shot. Ask your doctor if you need to have a glucagon shot kit at home. Follow these instructions at home: Medicine  Take insulin and diabetes medicines as told.  If your doctor says you should take more or less insulin and medicines, do this exactly as told.  Do not run out of insulin or medicines. Having diabetes can put you at risk for other long-term conditions. These include heart disease and kidney disease. Your doctor may prescribe medicines to prevent these problems. Food   Make healthy food choices. These  include: ? Chicken, fish, egg whites, and beans. ? Oats, whole wheat, bulgur, brown rice, quinoa, and millet. ? Fresh fruits and vegetables. ? Low-fat dairy products. ? Nuts, avocado, olive oil, and canola oil.  Meet with a food specialist (dietitian). He or she can help you make an eating plan that is right for you.  Follow instructions from your doctor about what you cannot eat or drink.  Drink enough fluid to keep your pee (urine) pale yellow.  Eat healthy snacks between healthy meals.  Keep track of the carbs you eat. Do this by reading food labels and learning food serving sizes.  Follow your sick day plan when you cannot eat or drink normally. Make this plan with your doctor so it is ready to use. Activity  Exercise for 30 minutes or more a day during your pregnancy or as much as told by your doctor.  Talk with your doctor before you start a new exercise or activity. Your doctor may need to tell you to change: ? How much insulin or medicines you take. ? How much food you eat. Lifestyle  Do not drink alcohol.  Do not use any tobacco products, such as cigarettes, chewing tobacco, and e-cigarettes. If you need help quitting, ask your doctor.  Learn how to deal with stress. If you need help with this, ask your doctor. Body care  Stay up to date with your shots (immunizations).  Get an eye exam during your first trimester.  Check your skin and feet every day. Check for cuts, bruises, redness, blisters, or sores.  Get regular foot exams as told by your doctor.  Brush your teeth and gums two times a day. Floss one or more times a day.  Go to the dentist one or more times every 6 months.  Stay at a healthy weight during your pregnancy. General instructions  Take over-the-counter and prescription medicines only as told by your doctor.  Talk with your doctor about your risk for high blood pressure during pregnancy (preeclampsia or eclampsia).  Share your diabetes care  plan with: ? Your work or school. ? People you live with.  Check your pee for ketones: ? When you are sick. ? As told by your doctor.  Carry a card or wear jewelry that says that you have diabetes.  Keep all follow-up visits with your doctor. This is important. Questions to ask your doctor  Do I need to meet with a diabetes educator?  Where can I find a support group for people with diabetes? Where to find more information To learn more about diabetes, visit:  American Diabetes Association: www.diabetes.org  American Association of Diabetes Educators (AADE): www.diabeteseducator.org Summary  When you have type 1 or type 2 diabetes (diabetes mellitus), you must keep your blood sugar (glucose) in a healthy range.  Check your blood sugar every   day, as often as told.  Take insulin and diabetes medicines as told.  Keep all follow-up visits as told by your doctor. This is important. This information is not intended to replace advice given to you by your health care provider. Make sure you discuss any questions you have with your health care provider. Document Revised: 08/26/2018 Document Reviewed: 06/10/2015 Elsevier Patient Education  2020 [employer].

## 2020-05-17 NOTE — Progress Notes (Signed)
   PRENATAL VISIT NOTE  Subjective:  Michelle Houston is a 39 y.o. [id number] at [redacted]w[redacted]d being seen today for ongoing prenatal care.  She is currently monitored for the following issues for this high-risk pregnancy and has Chronic hypertension; DM (diabetes mellitus) (HCC); S/P cholecystectomy; History of bladder repair surgery; H/O carpal tunnel repair; Obese; AMA (advanced maternal age) multigravida 35+; Diabetes mellitus affecting pregnancy, antepartum; Chronic hypertension during pregnancy, antepartum; Encounter for supervision of high risk multigravida of advanced maternal age, antepartum; Maternal obesity affecting pregnancy, antepartum; [redacted] weeks gestation of pregnancy; and BMI 45.0-49.9, adult (HCC) on their problem list.  Patient doing well with no acute concerns today. She reports no complaints.  Contractions: Irregular. Vag. Bleeding: None.  Movement: Present. Denies leaking of fluid.   FBS:95-103 PPBS: 106-132, 1 outlier  The following portions of the patient's history were reviewed and updated as appropriate: allergies, current medications, past family history, past medical history, past social history, past surgical history and problem list. Problem list updated.  Objective:   Vitals:   05/17/20 0848  BP: 114/79  Pulse: (!) 104  Temp: 98.5 F (36.9 C)  Weight: 238 lb 3.2 oz (108 kg)    Fetal Status: Fetal Heart Rate (bpm): 154   Movement: Present     General:  Alert, oriented and cooperative. Patient is in no acute distress.  Skin: Skin is warm and dry. No rash noted.   Cardiovascular: Normal heart rate noted  Respiratory: Normal respiratory effort, no problems with respiration noted  Abdomen: Soft, gravid, appropriate for gestational age.  Pain/Pressure: Present     Pelvic: Cervical exam deferred        Extremities: Normal range of motion.  Edema: None  Mental Status:  Normal mood and affect. Normal behavior. Normal judgment and thought content.   Assessment and Plan:   Pregnancy: [id number] at [redacted]w[redacted]d  1. Maternal obesity affecting pregnancy, antepartum   2. Chronic hypertension during pregnancy, antepartum Good blood pressure control  3. Diabetes mellitus affecting pregnancy, antepartum Good sugar control, pt advised to stop night snacking  4. Multigravida of advanced maternal age in third trimester   5. Encounter for supervision of high risk multigravida of advanced maternal age, antepartum Pt starting weekly BPP today Pt desires postplacental progesterone IUD  6. [redacted] weeks gestation of pregnancy   7. BMI 45.0-49.9, adult (HCC)   Preterm labor symptoms and general obstetric precautions including but not limited to vaginal bleeding, contractions, leaking of fluid and fetal movement were reviewed in detail with the patient.  Please refer to After Visit Summary for other counseling recommendations.   Return in about 2 weeks (around 05/31/2020) for [facility], in person.   Mariel Aloe, MD

## 2020-05-21 NOTE — L&D Delivery Note (Addendum)
OB/GYN Faculty Practice Delivery Note  Michelle Houston is a 40 y.o. [id number] at [redacted]w[redacted]d. She was admitted for IOL d/t A2GDM.   ROM: 5h 54m with clear fluid GBS Status: neg Maximum Maternal Temperature: 98.1*F  Labor Progress: . Patient started induction with pitocin as she was dilated to 2.5 cm, progressed to AROM at 1730 and continued to complete.  Delivery Date/Time: June 17, 2020 at 2328 Delivery: Called to room and patient was complete and pushing. Head delivered LOA. Nuchal cord present x1, delivered through the cord. Shoulder and body delivered in usual fashion. Infant with spontaneous cry, placed on mother's abdomen, dried and stimulated. Cord clamped x 2 after 1-minute delay, and cut by FOB. Cord blood drawn. Placenta delivered spontaneously with gentle cord traction. Fundus firm with massage and Pitocin. Labia, perineum, vagina, and cervix inspected with no lacerations. Paraguard IUD was not placed as patient did not have epidural and she requested it be placed at 6 week postpartum visit.  Placenta: Spontaneous, intact, 3VC Complications: none Lacerations: none EBL: 302 cc Analgesia: none  Infant: viable female  APGARs 8, 9  weight pending per medical chart  to newborn care/skin to skin  Shirlean Mylar, MD [facility], PGY-2 06/18/2020, 12:13 AM   I was present and scrubbed for the entirety of the delivery. I agree with the findings and the plan of care as documented in the resident's note.  Casper Harrison, MD [facility] Family Medicine Fellow, [facility] for [facility], [facility]

## 2020-05-24 ENCOUNTER — Inpatient Hospital Stay (HOSPITAL_COMMUNITY)
Admission: AD | Admit: 2020-05-24 | Discharge: 2020-05-25 | Disposition: A | Discharge status: Home / Self Care | Payer: Medicaid Other | Attending: Obstetrics and Gynecology | Admitting: Obstetrics and Gynecology

## 2020-05-24 ENCOUNTER — Encounter (HOSPITAL_COMMUNITY): Payer: Self-pay | Admitting: Obstetrics and Gynecology

## 2020-05-24 DIAGNOSIS — M549 Dorsalgia, unspecified: Secondary | ICD-10-CM

## 2020-05-24 DIAGNOSIS — Z3A33 33 weeks gestation of pregnancy: Secondary | ICD-10-CM

## 2020-05-24 DIAGNOSIS — O10913 Unspecified pre-existing hypertension complicating pregnancy, third trimester: Secondary | ICD-10-CM | POA: Insufficient documentation

## 2020-05-24 DIAGNOSIS — O24415 Gestational diabetes mellitus in pregnancy, controlled by oral hypoglycemic drugs: Secondary | ICD-10-CM

## 2020-05-24 DIAGNOSIS — O26893 Other specified pregnancy related conditions, third trimester: Secondary | ICD-10-CM

## 2020-05-24 DIAGNOSIS — O10919 Unspecified pre-existing hypertension complicating pregnancy, unspecified trimester: Secondary | ICD-10-CM

## 2020-05-24 LAB — CBC
HCT: 38 % (ref 36.0–46.0)
Hemoglobin: 12.5 g/dL (ref 12.0–15.0)
MCH: 28.7 pg (ref 26.0–34.0)
MCHC: 32.9 g/dL (ref 30.0–36.0)
MCV: 87.2 fL (ref 80.0–100.0)
Platelets: 248 10*3/uL (ref 150–400)
RBC: 4.36 MIL/uL (ref 3.87–5.11)
RDW: 13.2 % (ref 11.5–15.5)
WBC: 7.1 10*3/uL (ref 4.0–10.5)
nRBC: 0 % (ref 0.0–0.2)

## 2020-05-24 LAB — URINALYSIS, ROUTINE W REFLEX MICROSCOPIC
Bilirubin Urine: NEGATIVE
Glucose, UA: 50 mg/dL — AB
Hgb urine dipstick: NEGATIVE
Ketones, ur: NEGATIVE mg/dL
Leukocytes,Ua: NEGATIVE
Nitrite: NEGATIVE
Protein, ur: NEGATIVE mg/dL
Specific Gravity, Urine: 1.006 (ref 1.005–1.030)
pH: 5 (ref 5.0–8.0)

## 2020-05-24 LAB — PROTEIN / CREATININE RATIO, URINE
Creatinine, Urine: 51.54 mg/dL
Protein Creatinine Ratio: 0.14 mg/mg{Cre} (ref 0.00–0.15)
Total Protein, Urine: 7 mg/dL

## 2020-05-24 LAB — COMPREHENSIVE METABOLIC PANEL
ALT: 28 U/L (ref 0–44)
AST: 31 U/L (ref 15–41)
Albumin: 2.5 g/dL — ABNORMAL LOW (ref 3.5–5.0)
Alkaline Phosphatase: 148 U/L — ABNORMAL HIGH (ref 38–126)
Anion gap: 12 (ref 5–15)
BUN: 7 mg/dL (ref 6–20)
CO2: 19 mmol/L — ABNORMAL LOW (ref 22–32)
Calcium: 9.3 mg/dL (ref 8.9–10.3)
Chloride: 100 mmol/L (ref 98–111)
Creatinine, Ser: 0.65 mg/dL (ref 0.44–1.00)
GFR, Estimated: >60 mL/min (ref >60)
Glucose, Bld: 177 mg/dL — ABNORMAL HIGH (ref 70–99)
Potassium: 3.5 mmol/L (ref 3.5–5.1)
Sodium: 131 mmol/L — ABNORMAL LOW (ref 135–145)
Total Bilirubin: 0.4 mg/dL (ref 0.3–1.2)
Total Protein: 6.3 g/dL — ABNORMAL LOW (ref 6.5–8.1)

## 2020-05-24 LAB — GLUCOSE, CAPILLARY: Glucose-Capillary: 153 mg/dL — ABNORMAL HIGH (ref 70–99)

## 2020-05-24 MED ORDER — LABETALOL HCL 100 MG PO TABS
100.0000 mg | ORAL_TABLET | Freq: Once | ORAL | Status: AC
  Administered 2020-05-24: 100 mg via ORAL
  Filled 2020-05-24: qty 1

## 2020-05-24 MED ORDER — CYCLOBENZAPRINE HCL 5 MG PO TABS
10.0000 mg | ORAL_TABLET | Freq: Once | ORAL | Status: AC
  Administered 2020-05-24: 10 mg via ORAL
  Filled 2020-05-24: qty 2

## 2020-05-24 MED ORDER — LABETALOL HCL 100 MG PO TABS: 100.0000 mg | ORAL_TABLET | Freq: Two times a day (BID) | ORAL | 3 refills | Status: AC

## 2020-05-24 NOTE — MAU Note (Signed)
..  Michelle Houston is a 40 y.o. at [redacted]w[redacted]d here in MAU reporting: High blood pressure and occasional contractions. She took her blood pressure around 2000 and it was elevated (145/100 and 135/100). Patient took 100 mg of labetalol this morning. Patient is also reporting contractions around every 5 minutes that began today. Contractions are not painful only uncomfortable. Pain score: 1/10

## 2020-05-24 NOTE — MAU Provider Note (Signed)
Chief Complaint  Patient presents with  . Contractions  . Hypertension     Event Date/Time   First Provider Initiated Contact with Patient 05/24/20 2126      S: Michelle Houston  is a 40 y.o. y.o. year old [id number] female at [redacted]w[redacted]d weeks gestation who presents to MAU with elevated blood pressures.  Hx of hypertension- CHTN. Current blood pressure medication: Labetalol 100mg  once at day in the morning   Associated symptoms: Denies Headache, denies vision changes, denies epigastric pain Contractions: occasional intermittent contractions and back pain, patient reports that they are not painful just more uncomfortable - rates pain 1/10.  Vaginal bleeding: denies  Fetal movement: present   O:  Patient Vitals for the past 24 hrs:  BP Pulse SpO2  05/24/20 2351 121/71 (!) 104 --  05/24/20 2256 -- -- 99 %  05/24/20 2251 -- -- 99 %  05/24/20 2246 120/80 (!) 107 98 %  05/24/20 2241 -- -- 100 %  05/24/20 2239 126/77 (!) 116 --  05/24/20 2236 -- -- 100 %  05/24/20 2226 -- -- 99 %  05/24/20 2221 -- -- 99 %  05/24/20 2216 128/75 (!) 114 100 %  05/24/20 2211 -- -- 99 %  05/24/20 2206 -- -- 99 %  05/24/20 2201 119/74 (!) 115 99 %  05/24/20 2156 -- -- 99 %  05/24/20 2151 -- -- 99 %  05/24/20 2146 131/83 (!) 115 99 %  05/24/20 2145 (!) 146/90 (!) 116 --  05/24/20 2141 -- -- 99 %  05/24/20 2136 -- -- 99 %  05/24/20 2131 (!) 146/90 (!) 115 99 %  05/24/20 2126 -- -- 99 %  05/24/20 2121 -- -- 99 %  05/24/20 2117 (!) 151/89 (!) 119 --  05/24/20 2116 -- -- 99 %  05/24/20 2111 -- -- 99 %   General: NAD Heart: Regular rate Lungs: Normal rate and effort Abd: Soft, NT, Gravid, S>D Extremities: no Pedal edema Neuro: 2+ deep tendon reflexes, No clonus Dilation: Closed Effacement (%): Thick Cervical Position: Posterior Exam by:: V [email or web address] CNM  Fetal monitoring:  145/moderate/+accels/no decel  Toco UI with occasional mild contractions    Results for orders placed or performed during the  hospital encounter of 05/24/20 (from the past 24 hour(s))  Protein / creatinine ratio, urine     Status: None   Collection Time: 05/24/20  9:26 PM  Result Value Ref Range   Creatinine, Urine 51.54 mg/dL   Total Protein, Urine 7 mg/dL   Protein Creatinine Ratio 0.14 0.00 - 0.15 mg/mg[Cre]  Urinalysis, Routine w reflex microscopic Urine, Clean Catch     Status: Abnormal   Collection Time: 05/24/20  9:27 PM  Result Value Ref Range   Color, Urine YELLOW YELLOW   APPearance CLEAR CLEAR   Specific Gravity, Urine 1.006 1.005 - 1.030   pH 5.0 5.0 - 8.0   Glucose, UA 50 (A) NEGATIVE mg/dL   Hgb urine dipstick NEGATIVE NEGATIVE   Bilirubin Urine NEGATIVE NEGATIVE   Ketones, ur NEGATIVE NEGATIVE mg/dL   Protein, ur NEGATIVE NEGATIVE mg/dL   Nitrite NEGATIVE NEGATIVE   Leukocytes,Ua NEGATIVE NEGATIVE  CBC     Status: None   Collection Time: 05/24/20  9:53 PM  Result Value Ref Range   WBC 7.1 4.0 - 10.5 K/uL   RBC 4.36 3.87 - 5.11 MIL/uL   Hemoglobin 12.5 12.0 - 15.0 g/dL   HCT 07/22/20 82.4 - 23.5 %   MCV 87.2 80.0 - 100.0 fL  MCH 28.7 26.0 - 34.0 pg   MCHC 32.9 30.0 - 36.0 g/dL   RDW 38.9 37.3 - 42.8 %   Platelets 248 150 - 400 K/uL   nRBC 0.0 0.0 - 0.2 %  Comprehensive metabolic panel     Status: Abnormal   Collection Time: 05/24/20  9:53 PM  Result Value Ref Range   Sodium 131 (L) 135 - 145 mmol/L   Potassium 3.5 3.5 - 5.1 mmol/L   Chloride 100 98 - 111 mmol/L   CO2 19 (L) 22 - 32 mmol/L   Glucose, Bld 177 (H) 70 - 99 mg/dL   BUN 7 6 - 20 mg/dL   Creatinine, Ser 7.68 0.44 - 1.00 mg/dL   Calcium 9.3 8.9 - 11.5 mg/dL   Total Protein 6.3 (L) 6.5 - 8.1 g/dL   Albumin 2.5 (L) 3.5 - 5.0 g/dL   AST 31 15 - 41 U/L   ALT 28 0 - 44 U/L   Alkaline Phosphatase 148 (H) 38 - 126 U/L   Total Bilirubin 0.4 0.3 - 1.2 mg/dL   GFR, Estimated >72 >62 mL/min   Anion gap 12 5 - 15  Glucose, capillary     Status: Abnormal   Collection Time: 05/24/20 11:07 PM  Result Value Ref Range    Glucose-Capillary 153 (H) 70 - 99 mg/dL     Meds ordered this encounter  Medications  . cyclobenzaprine (FLEXERIL) tablet 10 mg  . labetalol (NORMODYNE) tablet 100 mg   Labetalol 100mg  given in MAU @2145 .  Patient reports that she did not take Metformin tonight prior to coming to MAU - CBG 153 in MAU  Encouraged patient to take Metformin when she gets home - patient verbalizes understanding   PEC labs negative  BP normal after Labetalol 100mg  in MAU  Discussed with patient increasing Labetalol 100mg  to BID instead of daily, patient verbalizes understanding   Discussed reasons to return to MAU. Follow up as scheduled in the office. PEC precautions reviewed. Return to MAU as needed. Pt stable at time of discharge.    A: [redacted]w[redacted]d week IUP Chronic hypertension FHR reactive  P: Discharge home in stable condition per consult with Dr . Preeclampsia precautions. Follow-up in the office as scheduled on 1/5 Return to maternity admissions as needed in emergencies  , CNM 05/25/2020 1:39 AM

## 2020-05-25 ENCOUNTER — Ambulatory Visit: Payer: Medicaid Other | Admitting: *Deleted

## 2020-05-25 ENCOUNTER — Other Ambulatory Visit: Payer: Self-pay

## 2020-05-25 ENCOUNTER — Ambulatory Visit: Payer: Medicaid Other | Attending: Obstetrics and Gynecology

## 2020-05-25 ENCOUNTER — Encounter: Payer: Self-pay | Admitting: *Deleted

## 2020-05-25 DIAGNOSIS — O09523 Supervision of elderly multigravida, third trimester: Secondary | ICD-10-CM | POA: Diagnosis not present

## 2020-05-25 DIAGNOSIS — O10919 Unspecified pre-existing hypertension complicating pregnancy, unspecified trimester: Secondary | ICD-10-CM

## 2020-05-25 DIAGNOSIS — Z3A33 33 weeks gestation of pregnancy: Secondary | ICD-10-CM

## 2020-05-25 DIAGNOSIS — O24113 Pre-existing diabetes mellitus, type 2, in pregnancy, third trimester: Secondary | ICD-10-CM

## 2020-05-25 DIAGNOSIS — O99210 Obesity complicating pregnancy, unspecified trimester: Secondary | ICD-10-CM | POA: Insufficient documentation

## 2020-05-25 DIAGNOSIS — O99213 Obesity complicating pregnancy, third trimester: Secondary | ICD-10-CM | POA: Diagnosis not present

## 2020-05-25 DIAGNOSIS — Z148 Genetic carrier of other disease: Secondary | ICD-10-CM

## 2020-05-25 DIAGNOSIS — E669 Obesity, unspecified: Secondary | ICD-10-CM

## 2020-05-26 ENCOUNTER — Encounter (HOSPITAL_COMMUNITY): Payer: Self-pay | Admitting: Obstetrics and Gynecology

## 2020-05-26 ENCOUNTER — Other Ambulatory Visit: Payer: Self-pay

## 2020-05-26 ENCOUNTER — Inpatient Hospital Stay (HOSPITAL_COMMUNITY)
Admission: AD | Admit: 2020-05-26 | Discharge: 2020-05-26 | Disposition: A | Discharge status: Home / Self Care | Payer: Medicaid Other | Attending: Obstetrics and Gynecology | Admitting: Obstetrics and Gynecology

## 2020-05-26 DIAGNOSIS — Z3A33 33 weeks gestation of pregnancy: Secondary | ICD-10-CM

## 2020-05-26 DIAGNOSIS — O4703 False labor before 37 completed weeks of gestation, third trimester: Secondary | ICD-10-CM | POA: Diagnosis not present

## 2020-05-26 DIAGNOSIS — O479 False labor, unspecified: Secondary | ICD-10-CM

## 2020-05-26 DIAGNOSIS — Z3689 Encounter for other specified antenatal screening: Secondary | ICD-10-CM | POA: Insufficient documentation

## 2020-05-26 LAB — URINALYSIS, ROUTINE W REFLEX MICROSCOPIC
Bilirubin Urine: NEGATIVE
Glucose, UA: NEGATIVE mg/dL
Hgb urine dipstick: NEGATIVE
Ketones, ur: NEGATIVE mg/dL
Leukocytes,Ua: NEGATIVE
Nitrite: NEGATIVE
Protein, ur: NEGATIVE mg/dL
Specific Gravity, Urine: 1.013 (ref 1.005–1.030)
pH: 5 (ref 5.0–8.0)

## 2020-05-26 MED ORDER — CYCLOBENZAPRINE HCL 10 MG PO TABS: 10.0000 mg | ORAL_TABLET | Freq: Three times a day (TID) | ORAL | 1 refills | Status: DC | PRN

## 2020-05-26 NOTE — Discharge Instructions (Signed)
Braxton Hicks Contractions °Contractions of the uterus can occur throughout pregnancy, but they are not always a sign that you are in labor. You may have practice contractions called Braxton Hicks contractions. These false labor contractions are sometimes confused with true labor. °What are Braxton Hicks contractions? °Braxton Hicks contractions are tightening movements that occur in the muscles of the uterus before labor. Unlike true labor contractions, these contractions do not result in opening (dilation) and thinning of the cervix. Toward the end of pregnancy (32-34 weeks), Braxton Hicks contractions can happen more often and may become stronger. These contractions are sometimes difficult to tell apart from true labor because they can be very uncomfortable. You should not feel embarrassed if you go to the hospital with false labor. °Sometimes, the only way to tell if you are in true labor is for your health care provider to look for changes in the cervix. The health care provider will do a physical exam and may monitor your contractions. If you are not in true labor, the exam should show that your cervix is not dilating and your water has not broken. °If there are no other health problems associated with your pregnancy, it is completely safe for you to be sent home with false labor. You may continue to have Braxton Hicks contractions until you go into true labor. °How to tell the difference between true labor and false labor °True labor °· Contractions last 30-70 seconds. °· Contractions become very regular. °· Discomfort is usually felt in the top of the uterus, and it spreads to the lower abdomen and low back. °· Contractions do not go away with walking. °· Contractions usually become more intense and increase in frequency. °· The cervix dilates and gets thinner. °False labor °· Contractions are usually shorter and not as strong as true labor contractions. °· Contractions are usually irregular. °· Contractions  are often felt in the front of the lower abdomen and in the groin. °· Contractions may go away when you walk around or change positions while lying down. °· Contractions get weaker and are shorter-lasting as time goes on. °· The cervix usually does not dilate or become thin. °Follow these instructions at home: ° °· Take over-the-counter and prescription medicines only as told by your health care provider. °· Keep up with your usual exercises and follow other instructions from your health care provider. °· Eat and drink lightly if you think you are going into labor. °· If Braxton Hicks contractions are making you uncomfortable: °? Change your position from lying down or resting to walking, or change from walking to resting. °? Sit and rest in a tub of warm water. °? Drink enough fluid to keep your urine pale yellow. Dehydration may cause these contractions. °? Do slow and deep breathing several times an hour. °· Keep all follow-up prenatal visits as told by your health care provider. This is important. °Contact a health care provider if: °· You have a fever. °· You have continuous pain in your abdomen. °Get help right away if: °· Your contractions become stronger, more regular, and closer together. °· You have fluid leaking or gushing from your vagina. °· You pass blood-tinged mucus (bloody show). °· You have bleeding from your vagina. °· You have low back pain that you never had before. °· You feel your baby’s head pushing down and causing pelvic pressure. °· Your baby is not moving inside you as much as it used to. °Summary °· Contractions that occur before labor are   called Braxton Hicks contractions, false labor, or practice contractions.  Braxton Hicks contractions are usually shorter, weaker, farther apart, and less regular than true labor contractions. True labor contractions usually become progressively stronger and regular, and they become more frequent.  Manage discomfort from [facility] contractions  by changing position, resting in a warm bath, drinking plenty of water, or practicing deep breathing. This information is not intended to replace advice given to you by your health care provider. Make sure you discuss any questions you have with your health care provider. Document Revised: 04/19/2017 Document Reviewed: 09/20/2016 Elsevier Patient Education  2020 [employer].  The [employer]uit  This circuit takes at least 90 minutes to complete so clear your schedule and make mental preparations so you can relax in your environment. The second step requires a lot of pillows so gather them up before beginning Before starting, you should empty your bladder! Have a nice drink nearby, and make sure it has a straw! If you are having contractions, this circuit should be done through contractions, try not to change positions between steps Before you begin...  "I named this 'circuit' after my friend Deneen Harts, who shared and discussed it with me when I was working with a client whose labor seemed to be stalled out and no longer progressing... This circuit is useful to help get the baby lined up, ideally, in the "Left Occiput Anterior" (LOA) Position, both before labor begins and when some corrections need to be done during labor. Prenatally, this position set can help to rotate a baby. As a natural method of induction, this can help get things going if baby just needed a gentle nudge of position to set things off. To the best of my knowledge, this group of positions will not "hurt" a baby that is already lined up correctly." - Sharlyn Bologna   Step One: Open-knee Chest Stay in this position for 30 minutes, start in cat/cow, then drop your chest as low as you can to the bed or the floor and your bottom as high as you can. Knees should be fairly wide apart, and the angle between the torso/thighs should be wider than 90 degrees. Wiggle around, prop with lots of pillows and use this time to  get totally relaxed. This position allows the baby to scoot out of the pelvis a bit and gives them room to rotate, shift their head position, etc. If the pregnant person finds it helpful, careful positioning with a rebozo under the belly, with gentle tension from a support person behind can help maintain this position for the full 30 minutes.  Step WCB:[id number] Left Side Lying Roll to your left side, bringing your top leg as high as possible and keeping your bottom leg straight. Roll forward as much as possible, again using a lot of pillows. Sink into the bed and relax some more. If you fall asleep, that's totally okay and you can stay there! If not, stay here for at least another half an hour. Try and get your top right leg up towards your head and get as rolled over onto your belly as much as possible. If you repeat the circuit during labor, try alternating left and right sides. We know the photo the left is actually right side... just flip the image in your head.  Step Three: Moving and Lunges Lunge, walk stairs facing sideways, 2 at a time, (have a spotter downstairs of you!), take a walk outside with one foot on the curb and the  other on the street, sit on °a birth ball and hula- anything that's upright and putting °your pelvis in open, asymmetrical positions. Spend at °least 30 minutes doing this one as well to give your °baby a chance to move down. If you are lunging or stair °or curb walking, you should lunge/walk/go up stairs in °the direction that feels better to you. The key with the °lunge is that the toes of the higher leg and mom's belly °button should be at right angles. Do not lunge over °your knee, that closes the pelvis. ° ° ° ° °Megan Hamilton Miles: Circuit Creator - [email or web address] °Sharon Muza, CD, BDT (DONA), LCCE, FACCE: Supporting Content - [email or web address] °Emily Weaver Brown: Photography - [email or web address] °Kate Dewey CD/CDT  (BAI): Print and Webmaster - [email or web address] °[employer] °©[employer] [email or web address] ° °

## 2020-05-26 NOTE — MAU Provider Note (Signed)
Event Date/Time   First Provider Initiated Contact with Patient 05/26/20 1441     S [name] is a 40 y.o. [phone] pregnant female at [redacted]w[redacted]d who presents to MAU today with complaint of painful back contractions every 3-61min that have been going on since her last MAU visit on 05/25/19. She said the flexeril she was given at that visit helped a lot but they started back up once it wore off. She endorses fetal movement and denies vaginal bleeding or LOF.  O BP (!) 143/91   Pulse (!) 114   Temp 98.9 F (37.2 C) (Oral)   Resp 18   Ht 5' 1.5" (1.562 m)   Wt 235 lb 6.4 oz (106.8 kg)   LMP 10/02/2019   SpO2 97%   BMI 43.76 kg/m  Physical Exam Vitals and nursing note reviewed.  Constitutional:      General: She is not in acute distress.    Appearance: Normal appearance. She is normal weight. She is not ill-appearing.  Eyes:     Pupils: Pupils are equal, round, and reactive to light.  Cardiovascular:     Rate and Rhythm: Normal rate.     Pulses: Normal pulses.  Pulmonary:     Effort: Pulmonary effort is normal.  Abdominal:     Tenderness: There is no abdominal tenderness.     Comments: Gravid  Genitourinary:    General: Normal vulva.     Comments: Dilation: Closed Effacement (%): Thick Cervical Position: Posterior Station: Ballotable Presentation: Undeterminable Exam by:: Edd Arbour, CNM  Musculoskeletal:        General: Normal range of motion.  Skin:    General: Skin is warm and dry.     Capillary Refill: Capillary refill takes less than 2 seconds.  Neurological:     Mental Status: She is alert and oriented to person, place, and time.  Psychiatric:        Mood and Affect: Mood normal.        Behavior: Behavior normal.        Thought Content: Thought content normal.        Judgment: Judgment normal.    Pt stated she just wanted to make sure she wasn't dilating and requested flexeril to be taken at home.  A Braxton-Hicks contractions NST  reactive  P Discharge from MAU in stable condition with preterm labor precautions Follow up at [facility] with Dr. Donavan Foil on 06/02/19 Flexeril sent to pharmacy  See AVS for additional patient information including the [employer].  Bernerd Limbo, [place] 05/26/2020 3:39 PM

## 2020-05-26 NOTE — MAU Note (Signed)
Michelle Houston is a 40 y.o. at [redacted]w[redacted]d here in MAU reporting: contractions that have persisted since previous MAU visit, states they got worse this AM. They are every 3-4 min. Denies bleeding or LOF. +FM  Onset of complaint: ongoing but worse today  Pain score: 3/10  Vitals:   05/26/20 1355  BP: 135/74  Pulse: (!) 117  Resp: 18  Temp: 98.9 F (37.2 C)  SpO2: 99%     FHT: EFM applied in room  Lab orders placed from triage: [facility]

## 2020-06-01 ENCOUNTER — Other Ambulatory Visit: Payer: Self-pay

## 2020-06-01 ENCOUNTER — Encounter: Payer: Self-pay | Admitting: *Deleted

## 2020-06-01 ENCOUNTER — Ambulatory Visit: Payer: Medicaid Other | Admitting: *Deleted

## 2020-06-01 ENCOUNTER — Ambulatory Visit (INDEPENDENT_AMBULATORY_CARE_PROVIDER_SITE_OTHER): Payer: Medicaid Other | Admitting: Obstetrics and Gynecology

## 2020-06-01 ENCOUNTER — Ambulatory Visit: Payer: Medicaid Other | Attending: Obstetrics and Gynecology | Admitting: *Deleted

## 2020-06-01 VITALS — BP 132/88 | HR 114 | Wt 235.0 lb

## 2020-06-01 DIAGNOSIS — O09523 Supervision of elderly multigravida, third trimester: Secondary | ICD-10-CM

## 2020-06-01 DIAGNOSIS — Z3A34 34 weeks gestation of pregnancy: Secondary | ICD-10-CM | POA: Diagnosis not present

## 2020-06-01 DIAGNOSIS — O10919 Unspecified pre-existing hypertension complicating pregnancy, unspecified trimester: Secondary | ICD-10-CM

## 2020-06-01 DIAGNOSIS — O24419 Gestational diabetes mellitus in pregnancy, unspecified control: Secondary | ICD-10-CM | POA: Insufficient documentation

## 2020-06-01 DIAGNOSIS — Z6841 Body Mass Index (BMI) 40.0 and over, adult: Secondary | ICD-10-CM

## 2020-06-01 DIAGNOSIS — O99210 Obesity complicating pregnancy, unspecified trimester: Secondary | ICD-10-CM

## 2020-06-01 DIAGNOSIS — O09529 Supervision of elderly multigravida, unspecified trimester: Secondary | ICD-10-CM

## 2020-06-01 DIAGNOSIS — O24119 Pre-existing diabetes mellitus, type 2, in pregnancy, unspecified trimester: Secondary | ICD-10-CM

## 2020-06-01 DIAGNOSIS — O24919 Unspecified diabetes mellitus in pregnancy, unspecified trimester: Secondary | ICD-10-CM

## 2020-06-01 NOTE — Patient Instructions (Addendum)
Third Trimester of Pregnancy  The third trimester of pregnancy is from week 28 through week 40. This is also called months 7 through 9. This trimester is when your unborn baby (fetus) is growing very fast. At the end of the ninth month, the unborn baby is about 20 inches long. It weighs about 6-10 pounds. Body changes during your third trimester Your body continues to go through many changes during this time. The changes vary and generally return to normal after the baby is born. Physical changes  Your weight will continue to increase. You may gain 25-35 pounds (11-16 kg) by the end of the pregnancy. If you are underweight, you may gain 28-40 lb (about 13-18 kg). If you are overweight, you may gain 15-25 lb (about 7-11 kg).  You may start to get stretch marks on your hips, belly (abdomen), and breasts.  Your breasts will continue to grow and may hurt. A yellow fluid (colostrum) may leak from your breasts. This is the first milk you are making for your baby.  You may have changes in your hair.  Your belly button may stick out.  You may have more swelling in your hands, face, or ankles. Health changes  You may have heartburn.  You may have trouble pooping (constipation).  You may get hemorrhoids. These are swollen veins in the butt that can itch or get painful.  You may have swollen veins (varicose veins) in your legs.  You may have more body aches in the pelvis, back, or thighs.  You may have more tingling or numbness in your hands, arms, and legs. The skin on your belly may also feel numb.  You may feel short of breath as your womb (uterus) gets bigger. Other changes  You may pee (urinate) more often.  You may have more problems sleeping.  You may notice the unborn baby "dropping," or moving lower in your belly.  You may have more discharge coming from your vagina.  Your joints may feel loose, and you may have pain around your pelvic bone. Follow these instructions at  home: Medicines  Take over-the-counter and prescription medicines only as told by your doctor. Some medicines are not safe during pregnancy.  Take a prenatal vitamin that contains at least 600 micrograms (mcg) of folic acid. Eating and drinking  Eat healthy meals that include: ? Fresh fruits and vegetables. ? Whole grains. ? Good sources of protein, such as meat, eggs, or tofu. ? Low-fat dairy products.  Avoid raw meat and unpasteurized juice, milk, and cheese. These carry germs that can harm you and your baby.  Eat 4 or 5 small meals rather than 3 large meals a day.  You may need to take these actions to prevent or treat trouble pooping: ? Drink enough fluids to keep your pee (urine) pale yellow. ? Eat foods that are high in fiber. These include beans, whole grains, and fresh fruits and vegetables. ? Limit foods that are high in fat and sugar. These include fried or sweet foods. Activity  Exercise only as told by your doctor. Stop exercising if you start to have cramps in your womb.  Avoid heavy lifting.  Do not exercise if it is too hot or too humid, or if you are in a place of great height (high altitude).  If you choose to, you may have sex unless your doctor tells you not to. Relieving pain and discomfort  Take breaks often, and rest with your legs raised (elevated) if you have   leg cramps or low back pain.  Take warm water baths (sitz baths) to soothe pain or discomfort caused by hemorrhoids. Use hemorrhoid cream if your doctor approves.  Wear a good support bra if your breasts are tender.  If you develop bulging, swollen veins in your legs: ? Wear support hose as told by your doctor. ? Raise your feet for 15 minutes, 3-4 times a day. ? Limit salt in your food. Safety  Talk to your doctor before traveling far distances.  Do not use hot tubs, steam rooms, or saunas.  Wear your seat belt at all times when you are in a car.  Talk with your doctor if someone is  hurting you or yelling at you a lot. Preparing for your baby's arrival To prepare for the arrival of your baby:  Take prenatal classes.  Visit the hospital and tour the maternity area.  Buy a rear-facing car seat. Learn how to install it in your car.  Prepare the baby's room. Take out all pillows and stuffed animals from the baby's crib. General instructions  Avoid cat litter boxes and soil used by cats. These carry germs that can cause harm to the baby and can cause a loss of your baby by miscarriage or stillbirth.  Do not douche or use tampons. Do not use scented sanitary pads.  Do not smoke or use any products that contain nicotine or tobacco. If you need help quitting, ask your doctor.  Do not drink alcohol.  Do not use herbal medicines, illegal drugs, or medicines that were not approved by your doctor. Chemicals in these products can affect your baby.  Keep all follow-up visits. This is important. Where to find more information  American Pregnancy Association: americanpregnancy.org  American College of Obstetricians and Gynecologists: www.acog.org  Office on Women's Health: womenshealth.gov/pregnancy Contact a doctor if:  You have a fever.  You have mild cramps or pressure in your lower belly.  You have a nagging pain in your belly area.  You vomit, or you have watery poop (diarrhea).  You have bad-smelling fluid coming from your vagina.  You have pain when you pee, or your pee smells bad.  You have a headache that does not go away when you take medicine.  You have changes in how you see, or you see spots in front of your eyes. Get help right away if:  Your water breaks.  You have regular contractions that are less than 5 minutes apart.  You are spotting or bleeding from your vagina.  You have very bad belly cramps or pain.  You have trouble breathing.  You have chest pain.  You faint.  You have not felt the baby move for the amount of time told by  your doctor.  You have new or increased pain, swelling, or redness in an arm or leg. Summary  The third trimester is from week 28 through week 40 (months 7 through 9). This is the time when your unborn baby is growing very fast.  During this time, your discomfort may increase as you gain weight and as your baby grows.  Get ready for your baby to arrive by taking prenatal classes, buying a rear-facing car seat, and preparing the baby's room.  Get help right away if you are bleeding from your vagina, you have chest pain and trouble breathing, or you have not felt the baby move for the amount of time told by your doctor. This information is not intended to replace advice given   to you by your health care provider. Make sure you discuss any questions you have with your health care provider. Document Revised: 10/14/2019 Document Reviewed: 08/20/2019 Elsevier Patient Education  2021 Elsevier Inc.  Gestational Diabetes Mellitus, Self-Care When you have gestational diabetes mellitus, you must make sure your blood sugar (glucose) stays at a healthy level. What are the risks? If you do not get treated for this condition, it may cause problems for you and your unborn baby. For the mother  Giving birth to the baby early.  Having problems during labor and when giving birth.  Needing surgery to give birth to the baby (cesarean delivery).  Having problems with blood pressure.  Getting this form of diabetes again when pregnant.  Getting type 2 diabetes in the future. For the baby  Low blood sugar.  Bigger body size than is normal.  Breathing problems. How to monitor blood sugar Check your blood sugar every day while you are pregnant. Check it as often as told by your doctor. To do this: 1. Wash your hands with soap and water for at least 20 seconds. 2. Prick the side of your finger (not the tip) with the lancet. Use a different finger each time. 3. Gently rub the finger until a small drop  of blood appears. 4. Follow instructions that come with your meter for: ? Putting in the test strip. ? Putting blood on the strip. ? Getting the result. 5. Write down your result and any notes. In general, your blood sugar levels should be:  95 mg/dL (5.3 mmol/L) if you have not eaten.  140 mg/dL (7.8 mmol/L) 1 hour after a meal.  120 mg/dL (6.7 mmol/L) 2 hours after a meal.   Follow these instructions at home: Medicines  Take over-the-counter and prescription medicines only as told by your doctor.  If your doctor prescribed insulin or other diabetes medicines: ? Take them every day. ? Do not run out of insulin or other medicines. Plan ahead so you always have them. Eating and drinking  Follow instructions from your doctor about eating or drinking restrictions.  See a food expert (dietician) to help you create an eating plan that helps control your blood sugar. The foods in this plan will include: ? Low-fat proteins. ? Dried beans, nuts, and whole grain breads, cereals, or pasta. ? Fresh fruits and vegetables. ? Low-fat dairy products. ? Healthy fats.  Eat healthy snacks between healthy meals.  Drink enough fluid to keep your pee (urine) pale yellow.  Keep track of carbs that you eat. To do this: ? Read food labels. ? Learn the serving sizes of foods.  Follow your sick day plan when you cannot eat or drink normally. Make this plan with your doctor so it is ready to use.   Activity  Do exercises as told by your doctor.  Exercise for 30 or more minutes a day, or as much as your doctor recommends. To help you control blood sugar levels after a meal: ? Do 10 minutes of exercise after each meal. ? Start this exercise 30 minutes after the meal.  Talk with your doctor before you start a new exercise. Your doctor may tell you to change your insulin, other medicines, or food. Lifestyle  Do not drink alcohol.  Do not use any products that contain nicotine or tobacco, such  as cigarettes, e-cigarettes, and chewing tobacco. If you need help quitting, ask your doctor.  Learn how to deal with stress. If you need help with  this, ask your doctor. Body care  Stay up to date with your shots (vaccines).  Take good care of your teeth. To do this: ? Brush your teeth and gums two times a day. ? Floss one or more times a day. ? Go to the dentist one or more times every 6 months.  Stay at a healthy weight while you are pregnant. General instructions  Ask your doctor about risks of high blood pressure in pregnancy.  Share your diabetes care plan with: ? Your work or school. ? People you live with.  Check your pee for ketones: ? When you are sick. ? As told by your doctor.  Carry a card or wear a bracelet that says you have diabetes.  Keep all follow-up visits. Care after giving birth  Have your blood sugar checked 4-12 weeks after you give birth.  Get checked for diabetes one or more times every 3 years or as told. Where to find more information  American Diabetes Association (ADA): diabetes.org  Association of Diabetes Care & Education Specialists (ADCES): diabeteseducator.org  Centers for Disease Control and Prevention (CDC): TonerPromos.no  American Pregnancy Association: americanpregnancy.org  [place]. Department of Agriculture MyPlate: WrestlingReporter.dk Contact a doctor if:  Your blood sugar is above your target for two tests in a row.  You have a fever.  You are sick for 2 days or more and do not get better.  You have either of these problems for more than 6 hours: ? You vomit every time you eat or drink. ? You have watery poop (diarrhea). Get help right away if:  You cannot think clearly.  You have trouble breathing.  You have moderate or high ketones in your pee.  Blood or abnormal fluid starts to come out of your vagina.  You feel your baby is not moving as usual.  You start having early contractions. You may feel your belly tighten.  You  have a very bad headache. These symptoms may be an emergency. Get help right away. Call your local emergency services (911 in the [place]  Do not wait to see if the symptoms will go away.  Do not drive yourself to the hospital. Summary  Check your blood sugar (glucose) while you are pregnant. Check it as often as told by your doctor.  Take your insulin and diabetes medicines as told.  Have your blood sugar checked 4-12 weeks after you give birth.  Keep all follow-up visits. This information is not intended to replace advice given to you by your health care provider. Make sure you discuss any questions you have with your health care provider. Document Revised: 10/12/2019 Document Reviewed: 10/12/2019 Elsevier Patient Education  2021 [employer].

## 2020-06-01 NOTE — Progress Notes (Signed)
   PRENATAL VISIT NOTE  Subjective:  Michelle Houston is a 40 y.o. [id number] at [redacted]w[redacted]d being seen today for ongoing prenatal care.  She is currently monitored for the following issues for this high-risk pregnancy and has Chronic hypertension; DM (diabetes mellitus) (HCC); S/P cholecystectomy; History of bladder repair surgery; H/O carpal tunnel repair; Obese; AMA (advanced maternal age) multigravida 35+; Diabetes mellitus affecting pregnancy, antepartum; Chronic hypertension during pregnancy, antepartum; Encounter for supervision of high risk multigravida of advanced maternal age, antepartum; Maternal obesity affecting pregnancy, antepartum; [redacted] weeks gestation of pregnancy; BMI 45.0-49.9, adult (HCC); and [redacted] weeks gestation of pregnancy on their problem list.  Patient doing well with no acute concerns today. She reports no complaints.  Contractions: Irritability. Vag. Bleeding: None.  Movement: Present. Denies leaking of fluid.   The following portions of the patient's history were reviewed and updated as appropriate: allergies, current medications, past family history, past medical history, past social history, past surgical history and problem list. Problem list updated.  Objective:   Vitals:   06/01/20 1004  BP: 132/88  Pulse: (!) 114  Weight: 235 lb (106.6 kg)    Fetal Status: Fetal Heart Rate (bpm): 165 (Simultaneous filing. User may not have seen previous data.)   Movement: Present     General:  Alert, oriented and cooperative. Patient is in no acute distress.  Skin: Skin is warm and dry. No rash noted.   Cardiovascular: Normal heart rate noted  Respiratory: Normal respiratory effort, no problems with respiration noted  Abdomen: Soft, gravid, appropriate for gestational age.  Pain/Pressure: Present     Pelvic: Cervical exam deferred        Extremities: Normal range of motion.  Edema: None  Mental Status:  Normal mood and affect. Normal behavior. Normal judgment and thought content.    Assessment and Plan:  Pregnancy: [id number] at [redacted]w[redacted]d  1. Chronic hypertension during pregnancy, antepartum BP borderline, high normal  2. Diabetes mellitus affecting pregnancy, antepartum FBS:93-175 PPBS: 115-200 Discussed blood sugars, many high values related to poor diet choices, ie [employer].  Pt declines conversion to insulin at this time.  Pt encouraged to improve diet, if blood sugars remain elevated pt will likely need IOL at 37 weeks  3. Multigravida of advanced maternal age in third trimester   4. Encounter for supervision of high risk multigravida of advanced maternal age, antepartum Pt has BPP today  5. Maternal obesity affecting pregnancy, antepartum   6. BMI 45.0-49.9, adult (HCC)   7. [redacted] weeks gestation of pregnancy   Preterm labor symptoms and general obstetric precautions including but not limited to vaginal bleeding, contractions, leaking of fluid and fetal movement were reviewed in detail with the patient.  Please refer to After Visit Summary for other counseling recommendations.   Return in about 1 week (around 06/08/2020) for [facility], in person, 36 weeks swabs.   Mariel Aloe, MD

## 2020-06-01 NOTE — Procedures (Signed)
Caniyah ANGLIA BLAKLEY Feb 25, 1981 [redacted]w[redacted]d  Fetus A Non-Stress Test Interpretation for 06/01/20  Indication: Gestational Diabetes medication controlled  Fetal Heart Rate A Mode: External Baseline Rate (A): 150 bpm Variability: Moderate Accelerations: 15 x 15 Decelerations: None Multiple birth?: No  Uterine Activity Mode: Palpation,Toco Contraction Frequency (min): x2 Contraction Duration (sec): 40-50 Contraction Quality: Mild Resting Tone Palpated: Relaxed Resting Time: Adequate  Interpretation (Fetal Testing) Nonstress Test Interpretation: Reactive Comments: Reviewed tracing with Dr. Grace Bushy

## 2020-06-01 NOTE — Progress Notes (Signed)
ROB 34wks   CC:  Pt had recent MAU visits  for B/P,contractions  and Blood sugars pt brought log of blood sugars today.  Pt denies any HA's or visual changes.

## 2020-06-08 ENCOUNTER — Other Ambulatory Visit (HOSPITAL_COMMUNITY)
Admission: RE | Admit: 2020-06-08 | Discharge: 2020-06-08 | Disposition: A | Discharge status: Home / Self Care | Payer: Medicaid Other | Source: Ambulatory Visit | Attending: Obstetrics and Gynecology | Admitting: Obstetrics and Gynecology

## 2020-06-08 ENCOUNTER — Ambulatory Visit: Payer: Medicaid Other | Attending: Obstetrics and Gynecology

## 2020-06-08 ENCOUNTER — Other Ambulatory Visit: Payer: Self-pay

## 2020-06-08 ENCOUNTER — Ambulatory Visit (INDEPENDENT_AMBULATORY_CARE_PROVIDER_SITE_OTHER): Payer: Medicaid Other | Admitting: Obstetrics and Gynecology

## 2020-06-08 ENCOUNTER — Ambulatory Visit: Payer: Medicaid Other | Admitting: *Deleted

## 2020-06-08 ENCOUNTER — Encounter: Payer: Self-pay | Admitting: *Deleted

## 2020-06-08 VITALS — BP 124/87 | HR 108 | Wt 237.0 lb

## 2020-06-08 DIAGNOSIS — O99210 Obesity complicating pregnancy, unspecified trimester: Secondary | ICD-10-CM

## 2020-06-08 DIAGNOSIS — Z9049 Acquired absence of other specified parts of digestive tract: Secondary | ICD-10-CM

## 2020-06-08 DIAGNOSIS — O99213 Obesity complicating pregnancy, third trimester: Secondary | ICD-10-CM

## 2020-06-08 DIAGNOSIS — O10913 Unspecified pre-existing hypertension complicating pregnancy, third trimester: Secondary | ICD-10-CM | POA: Diagnosis present

## 2020-06-08 DIAGNOSIS — O10013 Pre-existing essential hypertension complicating pregnancy, third trimester: Secondary | ICD-10-CM

## 2020-06-08 DIAGNOSIS — O09523 Supervision of elderly multigravida, third trimester: Secondary | ICD-10-CM | POA: Insufficient documentation

## 2020-06-08 DIAGNOSIS — E119 Type 2 diabetes mellitus without complications: Secondary | ICD-10-CM

## 2020-06-08 DIAGNOSIS — Z3A35 35 weeks gestation of pregnancy: Secondary | ICD-10-CM | POA: Insufficient documentation

## 2020-06-08 DIAGNOSIS — O24113 Pre-existing diabetes mellitus, type 2, in pregnancy, third trimester: Secondary | ICD-10-CM | POA: Diagnosis not present

## 2020-06-08 DIAGNOSIS — O10919 Unspecified pre-existing hypertension complicating pregnancy, unspecified trimester: Secondary | ICD-10-CM

## 2020-06-08 DIAGNOSIS — O24919 Unspecified diabetes mellitus in pregnancy, unspecified trimester: Secondary | ICD-10-CM

## 2020-06-08 DIAGNOSIS — Z7984 Long term (current) use of oral hypoglycemic drugs: Secondary | ICD-10-CM

## 2020-06-08 DIAGNOSIS — Z148 Genetic carrier of other disease: Secondary | ICD-10-CM

## 2020-06-08 NOTE — Progress Notes (Signed)
   PRENATAL VISIT NOTE  Subjective:  Michelle Houston is a 40 y.o. [id number] at [redacted]w[redacted]d being seen today for ongoing prenatal care.  She is currently monitored for the following issues for this high-risk pregnancy and has Chronic hypertension; DM (diabetes mellitus) (HCC); S/P cholecystectomy; History of bladder repair surgery; H/O carpal tunnel repair; Obese; AMA (advanced maternal age) multigravida 35+; Diabetes mellitus affecting pregnancy, antepartum; Chronic hypertension during pregnancy, antepartum; Encounter for supervision of high risk multigravida of advanced maternal age, antepartum; Maternal obesity affecting pregnancy, antepartum; [redacted] weeks gestation of pregnancy; BMI 45.0-49.9, adult (HCC); [redacted] weeks gestation of pregnancy; and [redacted] weeks gestation of pregnancy on their problem list.  Patient doing well with no acute concerns today. She reports no complaints.  Contractions: Irregular. Vag. Bleeding: None.  Movement: Present. Denies leaking of fluid.    FBS: 116-208 PPBS 126-181  The following portions of the patient's history were reviewed and updated as appropriate: allergies, current medications, past family history, past medical history, past social history, past surgical history and problem list. Problem list updated.  Pt desires paraguard IUD for birth control postpartum  Objective:   Vitals:   06/08/20 1006  BP: 124/87  Pulse: (!) 108  Weight: 237 lb (107.5 kg)    Fetal Status: Fetal Heart Rate (bpm): 155   Movement: Present     General:  Alert, oriented and cooperative. Patient is in no acute distress.  Skin: Skin is warm and dry. No rash noted.   Cardiovascular: Normal heart rate noted  Respiratory: Normal respiratory effort, no problems with respiration noted  Abdomen: Soft, gravid, appropriate for gestational age.  Pain/Pressure: Present     Pelvic: Cervical exam performed Dilation: 1 Effacement (%): 60 Station: Ballotable  Extremities: Normal range of motion.  Edema:  Trace  Mental Status:  Normal mood and affect. Normal behavior. Normal judgment and thought content.   Assessment and Plan:  Pregnancy: [id number] at [redacted]w[redacted]d  1. Chronic hypertension during pregnancy, antepartum BP well controlled  2. Diabetes mellitus affecting pregnancy, antepartum Pt continues to have poor glucose control with oral medications, at this time will schedule for IOL at 37 weeks due to uncontrolled diabetes  3. Multigravida of advanced maternal age in third trimester  - Culture, beta strep (group b only) - Cervicovaginal ancillary only( [place])  4. Maternal obesity affecting pregnancy, antepartum   5. [redacted] weeks gestation of pregnancy  - Culture, beta strep (group b only) - Cervicovaginal ancillary only( [place])  6. S/P cholecystectomy   Preterm labor symptoms and general obstetric precautions including but not limited to vaginal bleeding, contractions, leaking of fluid and fetal movement were reviewed in detail with the patient.  Please refer to After Visit Summary for other counseling recommendations.   Return in about 1 week (around 06/15/2020) for [facility], in person.   Mariel Aloe, MD

## 2020-06-08 NOTE — Progress Notes (Signed)
+   Fetal movement. Continues to monitor BP. Fasting glucose 167 this AM. Pt states her glucose readings have been on the higher end. States that she is continuing to take her Metformin as directed.

## 2020-06-08 NOTE — Patient Instructions (Signed)
Type 1 or Type 2 Diabetes Mellitus During Pregnancy, Self Care When you have type 1 or type 2 diabetes (diabetes mellitus), you must keep your blood sugar (glucose) in a healthy range. You can do this with:  Healthy eating.  Exercise.  Lifestyle changes.  Insulin or medicines, if needed.  Support from your doctors and others. You can manage your diabetes at home while you are pregnant. What are the risks? If diabetes is treated, it is not likely to cause problems. If diabetes is not treated:  It may cause problems during labor and delivery. Some of those problems can hurt the unborn baby and the mother.  It may also cause a newborn to have breathing problems and low blood sugar. Having diabetes can put you at risk for other problems. These include heart disease and kidney disease. Your doctor may prescribe medicines to help you avoid getting these problems. How to stay aware of blood sugar Be sure to:  Check your blood sugar every day, as often as told.  Call your doctor if your blood sugar is above your goal numbers for 2 tests in a row.  Have your HbA1C level checked as often as told. Your doctor will set treatment goals for you. In general, you should have these blood sugar levels:  After not eating for 8 hours (after fasting): at or below 95 mg/dL (5.3 mmol/L).  After meals: ? One hour after a meal: at or below 140 mg/dL (7.8 mmol/L). ? Two hours after a meal: at or below 120 mg/dL (6.7 mmol/L).  HbA1C level: less than 6%.   Follow these instructions at home: Medicines  Take over-the-counter and prescription medicines only as told by your doctor.  If your doctor prescribed insulin or diabetes medicines: ? Take them as told. ? Take them every day.  Do not run out of insulin or medicines. Plan ahead to make sure you always have some.  If you use insulin, change how much you take based on how much exercise you get and what foods you eat. Your doctor will tell you how  to do this.  Your doctor may tell you to take one low-dose aspirin (81 mg) each day to help prevent high blood pressure while you are pregnant. You may be at risk if: ? Any of these happened while you were pregnant in the past:  You had high blood pressure.  Your unborn baby grew slower than normal.  You had an early birth.  Your placenta separated from your womb (uterus).  The baby died in the womb. ? You are pregnant with more than one baby. ? You have high blood pressure or other medical problems. Eating and drinking  Eat healthy foods to control diabetes. These include: ? Chicken, fish, egg whites, and beans. ? Oats, whole wheat, bulgur, brown rice, quinoa, and millet. ? Fresh fruits and vegetables. ? Low-fat dairy products. ? Nuts, avocado, olive oil, and canola oil.  Meet with a food specialist (dietitian). He or she can help you make an eating plan that is right for you.  Follow instructions from your doctor about what you cannot eat or drink.  Drink enough fluid to keep your pee (urine) pale yellow.  Eat healthy snacks between healthy meals.  Keep track of the carbs you eat. Do this by reading food labels and learning food serving sizes.  Follow your sick-day plan when you cannot eat or drink normally. Make this plan with your doctor so it is ready to   use.   Activity  Exercise for 30 minutes or more a day or as much as told by your doctor.  Talk with your doctor before you start a new exercise or activity. Your doctor may need to tell you to change: ? How much insulin or medicines you take. ? How much food you eat. Lifestyle  Do not drink alcohol.  Do not smoke or use any products that contain nicotine or tobacco. If you need help quitting, ask your doctor.  Learn how to deal with stress. If you need help with this, ask your doctor. Care for your body  Stay up to date with your shots (immunizations).  Get an eye exam during the first 3 months that you are  pregnant, or as told.  Check your skin and feet every day. Check for cuts, bruises, redness, blisters, or sores.  Get a foot exam once a year.  Brush your teeth and gums two times a day. Floss one or more times a day.  Go to the dentist one or more times every 6 months.  Stay at a healthy weight while you are pregnant. General instructions  Talk with your doctor about your risk for high blood pressure during pregnancy.  Share your diabetes care plan with: ? Your work or school. ? People you live with.  Check your pee for ketones when you are sick and as told.  Wear an alert bracelet or carry a card that says that you have diabetes.  Keep all follow-up visits. Questions to ask your doctor  Do I need to meet with a diabetes educator?  Where can I find a support group for people with diabetes? Where to find more information  American Diabetes Association: diabetes.org  Association of Diabetes Care & Education Specialists: diabeteseducator.org  International Diabetes Federation: idf.org Contact a doctor if:  Your blood sugar is at or above 240 mg/dL (13.3 mmol/L).  You have been sick or have had a fever for 2 days or more and you are not getting better.  You have any of these problems for more than 6 hours: ? You cannot eat or drink. ? You feel like you may vomit and you vomit. ? You have watery poop. Get help right away if:  You have very low blood sugar (severe hypoglycemia). This means your blood sugar is below 54 mg/dL (3 mmol/L).  You get mixed up (confused).  You have trouble thinking or breathing.  Your baby moves less than normal.  You get blood or fluid coming from your vagina.  Your contractions start early. These feel like tightening in your lower belly. These symptoms may be an emergency. Get help right away. Call your local emergency services (911 in the [place]  Do not wait to see if the symptoms will go away.  Do not drive yourself to the  hospital. Summary  When you have type 1 or type 2 diabetes, you must keep your blood sugar (glucose) in a healthy range. You can do that with insulin and other medicines, healthy eating, exercise, and lifestyle changes.  Check your blood sugar every day, as often as told.  Take insulin or diabetes medicines each day, if your doctor prescribed them.  Keep all follow-up visits. This information is not intended to replace advice given to you by your health care provider. Make sure you discuss any questions you have with your health care provider. Document Revised: 12/02/2019 Document Reviewed: 12/02/2019 Elsevier Patient Education  2021 Elsevier Inc.  

## 2020-06-09 ENCOUNTER — Encounter (HOSPITAL_COMMUNITY): Payer: Self-pay | Admitting: *Deleted

## 2020-06-09 ENCOUNTER — Telehealth (HOSPITAL_COMMUNITY): Payer: Self-pay | Admitting: *Deleted

## 2020-06-09 LAB — CERVICOVAGINAL ANCILLARY ONLY
Chlamydia: NEGATIVE
Comment: NEGATIVE
Comment: NORMAL
Neisseria Gonorrhea: NEGATIVE

## 2020-06-09 NOTE — Telephone Encounter (Signed)
Preadmission screen  

## 2020-06-10 ENCOUNTER — Other Ambulatory Visit (INDEPENDENT_AMBULATORY_CARE_PROVIDER_SITE_OTHER): Payer: Medicaid Other | Admitting: Obstetrics and Gynecology

## 2020-06-10 NOTE — Progress Notes (Signed)
IOL orders entered 

## 2020-06-12 LAB — CULTURE, BETA STREP (GROUP B ONLY): Strep Gp B Culture: NEGATIVE

## 2020-06-14 ENCOUNTER — Other Ambulatory Visit: Payer: Self-pay

## 2020-06-14 ENCOUNTER — Ambulatory Visit (INDEPENDENT_AMBULATORY_CARE_PROVIDER_SITE_OTHER): Payer: Medicaid Other | Admitting: Obstetrics & Gynecology

## 2020-06-14 ENCOUNTER — Other Ambulatory Visit: Payer: Self-pay | Admitting: Advanced Practice Midwife

## 2020-06-14 VITALS — BP 120/83 | HR 103 | Wt 235.1 lb

## 2020-06-14 DIAGNOSIS — O10919 Unspecified pre-existing hypertension complicating pregnancy, unspecified trimester: Secondary | ICD-10-CM

## 2020-06-14 DIAGNOSIS — O09529 Supervision of elderly multigravida, unspecified trimester: Secondary | ICD-10-CM

## 2020-06-14 DIAGNOSIS — O24919 Unspecified diabetes mellitus in pregnancy, unspecified trimester: Secondary | ICD-10-CM

## 2020-06-14 NOTE — Progress Notes (Signed)
   PRENATAL VISIT NOTE  Subjective:  Michelle Houston is a 40 y.o. [id number] at [redacted]w[redacted]d being seen today for ongoing prenatal care.  She is currently monitored for the following issues for this high-risk pregnancy and has Chronic hypertension; DM (diabetes mellitus) (HCC); History of bladder repair surgery; H/O carpal tunnel repair; Obese; AMA (advanced maternal age) multigravida 35+; Diabetes mellitus affecting pregnancy, antepartum; Chronic hypertension during pregnancy, antepartum; Encounter for supervision of high risk multigravida of advanced maternal age, antepartum; Maternal obesity affecting pregnancy, antepartum; and BMI 45.0-49.9, adult (HCC) on their problem list.  Patient reports occasional contractions.  Contractions: Irregular. Vag. Bleeding: None.  Movement: Present. Denies leaking of fluid.   The following portions of the patient's history were reviewed and updated as appropriate: allergies, current medications, past family history, past medical history, past social history, past surgical history and problem list.   Objective:   Vitals:   06/14/20 0819  BP: 120/83  Pulse: (!) 103  Weight: 235 lb 1.6 oz (106.6 kg)    Fetal Status: Fetal Heart Rate (bpm): 138   Movement: Present  Presentation: Vertex  General:  Alert, oriented and cooperative. Patient is in no acute distress.  Skin: Skin is warm and dry. No rash noted.   Cardiovascular: Normal heart rate noted  Respiratory: Normal respiratory effort, no problems with respiration noted  Abdomen: Soft, gravid, appropriate for gestational age.  Pain/Pressure: Absent     Pelvic: Cervical exam performed in the presence of a chaperone Dilation: 2.5 Effacement (%): 60 Station: -3  Extremities: Normal range of motion.  Edema: None  Mental Status: Normal mood and affect. Normal behavior. Normal judgment and thought content.    1. Encounter for supervision of high risk multigravida of advanced maternal age, antepartum LGA  2.  Chronic hypertension during pregnancy, antepartum Good control  3. Diabetes mellitus affecting pregnancy, antepartum Poor controlled noted with FBS 120-180, IOL 37 weeks  Preterm labor symptoms and general obstetric precautions including but not limited to vaginal bleeding, contractions, leaking of fluid and fetal movement were reviewed in detail with the patient. Please refer to After Visit Summary for other counseling recommendations.   Return if symptoms worsen or fail to improve, for postpartum.  Future Appointments  Date Time Provider Department Center  06/15/2020  9:30 AM [facility]EN[facility] None  06/15/2020 11:15 AM [facility] NURSE [facility]  06/15/2020 11:30 AM [facility] US3 [facility]  06/17/2020  7:10 AM [facility] SCHED ROOM [facility] None    Scheryl Darter, MD

## 2020-06-14 NOTE — Patient Instructions (Signed)
Labor Induction Labor induction is when steps are taken to cause a pregnant woman to begin the labor process. Most women go into labor on their own between 37 weeks and 42 weeks of pregnancy. When this does not happen, or when there is a medical need for labor to begin, steps may be taken to induce, or bring on, labor. Labor induction causes a pregnant woman's uterus to contract. It also causes the cervix to soften (ripen), open (dilate), and thin out. Usually, labor is not induced before 39 weeks of pregnancy unless there is a medical reason to do so. When is labor induction considered? Labor induction may be right for you if:  Your pregnancy lasts longer than 41 to 42 weeks.  Your placenta is separating from your uterus (placental abruption).  You have a rupture of membranes and your labor does not begin.  You have health problems, like diabetes or high blood pressure (preeclampsia) during your pregnancy.  Your baby has stopped growing or does not have enough amniotic fluid. Before labor induction begins, your health care provider will consider the following factors:  Your medical condition and the baby's condition.  How many weeks you have been pregnant.  How mature the baby's lungs are.  The condition of your cervix.  The position of the baby.  The size of your birth canal. Tell a health care provider about:  Any allergies you have.  All medicines you are taking, including vitamins, herbs, eye drops, creams, and over-the-counter medicines.  Any problems you or your family members have had with anesthetic medicines.  Any surgeries you have had.  Any blood disorders you have.  Any medical conditions you have. What are the risks? Generally, this is a safe procedure. However, problems may occur, including:  Failed induction.  Changes in fetal heart rate, such as being too high, too low, or irregular (erratic).  Infection in the mother or the baby.  Increased risk of  having a cesarean delivery.  Breaking off (abruption) of the placenta from the uterus. This is rare.  Rupture of the uterus. This is very rare.  Your baby could fail to get enough blood flow or oxygen. This can be life-threatening. When induction is needed for medical reasons, the benefits generally outweigh the risks. What happens during the procedure? During the procedure, your health care provider will use one of these methods to induce labor:  Stripping the membranes. In this method, the amniotic sac tissue is gently separated from the cervix. This causes the following to happen: ? Your cervix stretches, which in turn causes the release of prostaglandins. ? Prostaglandins induce labor and cause the uterus to contract. ? This procedure is often done in an office visit. You will be sent home to wait for contractions to begin.  Prostaglandin medicine. This medicine starts contractions and causes the cervix to dilate and ripen. This can be taken by mouth (orally) or by being inserted into the vagina (suppository).  Inserting a small, thin tube (catheter) with a balloon into the vagina and then expanding the balloon with water to dilate the cervix.  Breaking the water. In this method, a small instrument is used to make a small hole in the amniotic sac. This eventually causes the amniotic sac to break. Contractions should begin within a few hours.  Medicine to trigger or strengthen contractions. This medicine is given through an IV that is inserted into a vein in your arm. This procedure may vary among health care providers and hospitals.     Where to find more information  [employer] of [employer]: www.marchofdimes.org  The American College of Obstetricians and Gynecologists: www.acog.org Summary  Labor induction causes a pregnant woman's uterus to contract. It also causes the cervix to soften (ripen), open (dilate), and thin out.  Labor is usually not induced before 39 weeks of pregnancy unless  there is a medical reason to do so.  When induction is needed for medical reasons, the benefits generally outweigh the risks.  Talk with your health care provider about which methods of labor induction are right for you. This information is not intended to replace advice given to you by your health care provider. Make sure you discuss any questions you have with your health care provider. Document Revised: 02/18/2020 Document Reviewed: 02/18/2020 Elsevier Patient Education  2021 Elsevier Inc.  

## 2020-06-14 NOTE — Progress Notes (Signed)
+   Fetal movement. Pt states glucose levels are still increased after meals.

## 2020-06-15 ENCOUNTER — Encounter: Payer: Self-pay | Admitting: *Deleted

## 2020-06-15 ENCOUNTER — Ambulatory Visit (HOSPITAL_BASED_OUTPATIENT_CLINIC_OR_DEPARTMENT_OTHER): Payer: Medicaid Other

## 2020-06-15 ENCOUNTER — Other Ambulatory Visit
Admission: RE | Admit: 2020-06-15 | Discharge: 2020-06-15 | Disposition: A | Discharge status: Home / Self Care | Payer: Medicaid Other | Source: Ambulatory Visit | Attending: Obstetrics and Gynecology | Admitting: Obstetrics and Gynecology

## 2020-06-15 ENCOUNTER — Ambulatory Visit: Payer: Medicaid Other | Admitting: *Deleted

## 2020-06-15 DIAGNOSIS — O10913 Unspecified pre-existing hypertension complicating pregnancy, third trimester: Secondary | ICD-10-CM | POA: Insufficient documentation

## 2020-06-15 DIAGNOSIS — Z148 Genetic carrier of other disease: Secondary | ICD-10-CM

## 2020-06-15 DIAGNOSIS — O99213 Obesity complicating pregnancy, third trimester: Secondary | ICD-10-CM | POA: Diagnosis not present

## 2020-06-15 DIAGNOSIS — E119 Type 2 diabetes mellitus without complications: Secondary | ICD-10-CM | POA: Diagnosis not present

## 2020-06-15 DIAGNOSIS — Z20822 Contact with and (suspected) exposure to covid-19: Secondary | ICD-10-CM | POA: Insufficient documentation

## 2020-06-15 DIAGNOSIS — Z3A36 36 weeks gestation of pregnancy: Secondary | ICD-10-CM | POA: Diagnosis not present

## 2020-06-15 DIAGNOSIS — O24113 Pre-existing diabetes mellitus, type 2, in pregnancy, third trimester: Secondary | ICD-10-CM | POA: Diagnosis not present

## 2020-06-15 DIAGNOSIS — Z7984 Long term (current) use of oral hypoglycemic drugs: Secondary | ICD-10-CM

## 2020-06-15 DIAGNOSIS — O09523 Supervision of elderly multigravida, third trimester: Secondary | ICD-10-CM | POA: Diagnosis not present

## 2020-06-15 DIAGNOSIS — O99210 Obesity complicating pregnancy, unspecified trimester: Secondary | ICD-10-CM

## 2020-06-15 DIAGNOSIS — O10013 Pre-existing essential hypertension complicating pregnancy, third trimester: Secondary | ICD-10-CM

## 2020-06-15 LAB — SARS CORONAVIRUS 2 (TAT 6-24 HRS): SARS Coronavirus 2: NEGATIVE

## 2020-06-17 ENCOUNTER — Inpatient Hospital Stay (HOSPITAL_COMMUNITY)
Admission: AD | Admit: 2020-06-17 | Discharge: 2020-06-19 | DRG: 806 | Disposition: A | Discharge status: Home / Self Care | Payer: Medicaid Other | Attending: Family Medicine | Admitting: Family Medicine

## 2020-06-17 ENCOUNTER — Encounter (HOSPITAL_COMMUNITY): Payer: Self-pay | Admitting: Anesthesiology

## 2020-06-17 ENCOUNTER — Inpatient Hospital Stay (HOSPITAL_COMMUNITY): Payer: Medicaid Other

## 2020-06-17 ENCOUNTER — Encounter (HOSPITAL_COMMUNITY): Payer: Self-pay | Admitting: Obstetrics and Gynecology

## 2020-06-17 ENCOUNTER — Other Ambulatory Visit: Payer: Self-pay

## 2020-06-17 DIAGNOSIS — O99214 Obesity complicating childbirth: Secondary | ICD-10-CM | POA: Diagnosis present

## 2020-06-17 DIAGNOSIS — O99334 Smoking (tobacco) complicating childbirth: Secondary | ICD-10-CM | POA: Diagnosis present

## 2020-06-17 DIAGNOSIS — E119 Type 2 diabetes mellitus without complications: Secondary | ICD-10-CM | POA: Diagnosis present

## 2020-06-17 DIAGNOSIS — E669 Obesity, unspecified: Secondary | ICD-10-CM | POA: Diagnosis present

## 2020-06-17 DIAGNOSIS — Z3A37 37 weeks gestation of pregnancy: Secondary | ICD-10-CM

## 2020-06-17 DIAGNOSIS — D563 Thalassemia minor: Secondary | ICD-10-CM | POA: Diagnosis present

## 2020-06-17 DIAGNOSIS — Z7984 Long term (current) use of oral hypoglycemic drugs: Secondary | ICD-10-CM | POA: Diagnosis not present

## 2020-06-17 DIAGNOSIS — O3663X0 Maternal care for excessive fetal growth, third trimester, not applicable or unspecified: Secondary | ICD-10-CM | POA: Diagnosis present

## 2020-06-17 DIAGNOSIS — O2412 Pre-existing diabetes mellitus, type 2, in childbirth: Principal | ICD-10-CM | POA: Diagnosis present

## 2020-06-17 DIAGNOSIS — O1002 Pre-existing essential hypertension complicating childbirth: Secondary | ICD-10-CM | POA: Diagnosis present

## 2020-06-17 DIAGNOSIS — O24429 Gestational diabetes mellitus in childbirth, unspecified control: Secondary | ICD-10-CM | POA: Diagnosis not present

## 2020-06-17 DIAGNOSIS — F17210 Nicotine dependence, cigarettes, uncomplicated: Secondary | ICD-10-CM | POA: Diagnosis present

## 2020-06-17 DIAGNOSIS — O24425 Gestational diabetes mellitus in childbirth, controlled by oral hypoglycemic drugs: Secondary | ICD-10-CM | POA: Diagnosis present

## 2020-06-17 DIAGNOSIS — O24919 Unspecified diabetes mellitus in pregnancy, unspecified trimester: Secondary | ICD-10-CM | POA: Diagnosis present

## 2020-06-17 DIAGNOSIS — O10919 Unspecified pre-existing hypertension complicating pregnancy, unspecified trimester: Secondary | ICD-10-CM

## 2020-06-17 LAB — BASIC METABOLIC PANEL
Anion gap: 11 (ref 5–15)
BUN: 6 mg/dL (ref 6–20)
CO2: 19 mmol/L — ABNORMAL LOW (ref 22–32)
Calcium: 8.6 mg/dL — ABNORMAL LOW (ref 8.9–10.3)
Chloride: 100 mmol/L (ref 98–111)
Creatinine, Ser: 0.77 mg/dL (ref 0.44–1.00)
GFR, Estimated: >60 mL/min (ref >60)
Glucose, Bld: 194 mg/dL — ABNORMAL HIGH (ref 70–99)
Potassium: 3.6 mmol/L (ref 3.5–5.1)
Sodium: 130 mmol/L — ABNORMAL LOW (ref 135–145)

## 2020-06-17 LAB — CBC
HCT: 38.7 % (ref 36.0–46.0)
HCT: 39.6 % (ref 36.0–46.0)
Hemoglobin: 13.1 g/dL (ref 12.0–15.0)
Hemoglobin: 12.9 g/dL (ref 12.0–15.0)
MCH: 28.9 pg (ref 26.0–34.0)
MCH: 28.7 pg (ref 26.0–34.0)
MCHC: 33.9 g/dL (ref 30.0–36.0)
MCHC: 32.6 g/dL (ref 30.0–36.0)
MCV: 85.4 fL (ref 80.0–100.0)
MCV: 88 fL (ref 80.0–100.0)
Platelets: 222 10*3/uL (ref 150–400)
Platelets: 222 10*3/uL (ref 150–400)
RBC: 4.53 MIL/uL (ref 3.87–5.11)
RBC: 4.5 MIL/uL (ref 3.87–5.11)
RDW: 13.5 % (ref 11.5–15.5)
RDW: 13.8 % (ref 11.5–15.5)
WBC: 5.2 10*3/uL (ref 4.0–10.5)
WBC: 6.4 10*3/uL (ref 4.0–10.5)
nRBC: 0 % (ref 0.0–0.2)
nRBC: 0 % (ref 0.0–0.2)

## 2020-06-17 LAB — GLUCOSE, CAPILLARY
Glucose-Capillary: 129 mg/dL — ABNORMAL HIGH (ref 70–99)
Glucose-Capillary: 148 mg/dL — ABNORMAL HIGH (ref 70–99)
Glucose-Capillary: 150 mg/dL — ABNORMAL HIGH (ref 70–99)
Glucose-Capillary: 165 mg/dL — ABNORMAL HIGH (ref 70–99)
Glucose-Capillary: 197 mg/dL — ABNORMAL HIGH (ref 70–99)
Glucose-Capillary: 221 mg/dL — ABNORMAL HIGH (ref 70–99)
Glucose-Capillary: 99 mg/dL (ref 70–99)

## 2020-06-17 LAB — TYPE AND SCREEN
ABO/RH(D): A POS
Antibody Screen: NEGATIVE

## 2020-06-17 LAB — HEMOGLOBIN A1C
Hgb A1c MFr Bld: 7.2 % — ABNORMAL HIGH (ref 4.8–5.6)
Mean Plasma Glucose: 159.94 mg/dL

## 2020-06-17 LAB — COMPREHENSIVE METABOLIC PANEL
ALT: 33 U/L (ref 0–44)
AST: 32 U/L (ref 15–41)
Albumin: 2.5 g/dL — ABNORMAL LOW (ref 3.5–5.0)
Alkaline Phosphatase: 172 U/L — ABNORMAL HIGH (ref 38–126)
Anion gap: 11 (ref 5–15)
BUN: 8 mg/dL (ref 6–20)
CO2: 20 mmol/L — ABNORMAL LOW (ref 22–32)
Calcium: 8.9 mg/dL (ref 8.9–10.3)
Chloride: 99 mmol/L (ref 98–111)
Creatinine, Ser: 0.67 mg/dL (ref 0.44–1.00)
GFR, Estimated: >60 mL/min (ref >60)
Glucose, Bld: 138 mg/dL — ABNORMAL HIGH (ref 70–99)
Potassium: 4 mmol/L (ref 3.5–5.1)
Sodium: 130 mmol/L — ABNORMAL LOW (ref 135–145)
Total Bilirubin: 0.6 mg/dL (ref 0.3–1.2)
Total Protein: 6.3 g/dL — ABNORMAL LOW (ref 6.5–8.1)

## 2020-06-17 LAB — PROTEIN / CREATININE RATIO, URINE
Creatinine, Urine: 151.97 mg/dL
Protein Creatinine Ratio: 0.21 mg/mg{Cre} — ABNORMAL HIGH (ref 0.00–0.15)
Total Protein, Urine: 32 mg/dL

## 2020-06-17 MED ORDER — LACTATED RINGERS IV SOLN: INTRAVENOUS | Status: DC

## 2020-06-17 MED ORDER — DEXTROSE IN LACTATED RINGERS 5 % IV SOLN: INTRAVENOUS | Status: DC

## 2020-06-17 MED ORDER — OXYTOCIN BOLUS FROM INFUSION: 333.0000 mL | Freq: Once | INTRAVENOUS | Status: DC

## 2020-06-17 MED ORDER — DEXTROSE 50 % IV SOLN: 0.0000 mL | INTRAVENOUS | Status: DC | PRN

## 2020-06-17 MED ORDER — TRANEXAMIC ACID-NACL 1000-0.7 MG/100ML-% IV SOLN
INTRAVENOUS | Status: AC
  Administered 2020-06-17: 1000 mg
  Filled 2020-06-17: qty 100

## 2020-06-17 MED ORDER — PENICILLIN G POT IN DEXTROSE 60000 UNIT/ML IV SOLN: 3.0000 10*6.[IU] | INTRAVENOUS | Status: DC

## 2020-06-17 MED ORDER — MISOPROSTOL 25 MCG QUARTER TABLET: 25.0000 ug | ORAL_TABLET | ORAL | Status: DC | PRN

## 2020-06-17 MED ORDER — TRANEXAMIC ACID-NACL 1000-0.7 MG/100ML-% IV SOLN: 1000.0000 mg | INTRAVENOUS | Status: DC

## 2020-06-17 MED ORDER — ACETAMINOPHEN 325 MG PO TABS: 650.0000 mg | ORAL_TABLET | ORAL | Status: DC | PRN

## 2020-06-17 MED ORDER — FENTANYL-BUPIVACAINE-NACL 0.5-0.125-0.9 MG/250ML-% EP SOLN
12.0000 mL/h | EPIDURAL | Status: DC | PRN
Start: 2020-06-17 — End: 2020-06-18
  Filled 2020-06-17: qty 250

## 2020-06-17 MED ORDER — PHENYLEPHRINE 40 MCG/ML (10ML) SYRINGE FOR IV PUSH (FOR BLOOD PRESSURE SUPPORT)
80.0000 ug | PREFILLED_SYRINGE | INTRAVENOUS | Status: DC | PRN
  Filled 2020-06-17: qty 10

## 2020-06-17 MED ORDER — OXYTOCIN-SODIUM CHLORIDE 30-0.9 UT/500ML-% IV SOLN
1.0000 m[IU]/min | INTRAVENOUS | Status: DC
  Administered 2020-06-17: 2 m[IU]/min via INTRAVENOUS
  Filled 2020-06-17: qty 500

## 2020-06-17 MED ORDER — LACTATED RINGERS IV SOLN: 500.0000 mL | INTRAVENOUS | Status: DC | PRN

## 2020-06-17 MED ORDER — DIPHENHYDRAMINE HCL 50 MG/ML IJ SOLN: 12.5000 mg | INTRAMUSCULAR | Status: DC | PRN

## 2020-06-17 MED ORDER — ONDANSETRON HCL 4 MG/2ML IJ SOLN: 4.0000 mg | Freq: Four times a day (QID) | INTRAMUSCULAR | Status: DC | PRN

## 2020-06-17 MED ORDER — LIDOCAINE HCL (PF) 1 % IJ SOLN: 30.0000 mL | INTRAMUSCULAR | Status: DC | PRN

## 2020-06-17 MED ORDER — LABETALOL HCL 100 MG PO TABS
100.0000 mg | ORAL_TABLET | Freq: Two times a day (BID) | ORAL | Status: DC
  Administered 2020-06-17: 100 mg via ORAL
  Filled 2020-06-17 (×2): qty 1

## 2020-06-17 MED ORDER — LACTATED RINGERS IV SOLN: 500.0000 mL | Freq: Once | INTRAVENOUS | Status: DC

## 2020-06-17 MED ORDER — FENTANYL CITRATE (PF) 100 MCG/2ML IJ SOLN
50.0000 ug | INTRAMUSCULAR | Status: DC | PRN
  Administered 2020-06-17 (×2): 100 ug via INTRAVENOUS
  Filled 2020-06-17 (×2): qty 2

## 2020-06-17 MED ORDER — OXYCODONE-ACETAMINOPHEN 5-325 MG PO TABS
1.0000 | ORAL_TABLET | ORAL | Status: DC | PRN
  Administered 2020-06-18: 1 via ORAL
  Filled 2020-06-17: qty 1

## 2020-06-17 MED ORDER — SOD CITRATE-CITRIC ACID 500-334 MG/5ML PO SOLN: 30.0000 mL | ORAL | Status: DC | PRN

## 2020-06-17 MED ORDER — PHENYLEPHRINE 40 MCG/ML (10ML) SYRINGE FOR IV PUSH (FOR BLOOD PRESSURE SUPPORT): 80.0000 ug | PREFILLED_SYRINGE | INTRAVENOUS | Status: DC | PRN

## 2020-06-17 MED ORDER — INSULIN REGULAR(HUMAN) IN NACL 100-0.9 UT/100ML-% IV SOLN
INTRAVENOUS | Status: DC
  Administered 2020-06-17: 5.5 [IU]/h via INTRAVENOUS
  Filled 2020-06-17: qty 100

## 2020-06-17 MED ORDER — TERBUTALINE SULFATE 1 MG/ML IJ SOLN: 0.2500 mg | Freq: Once | INTRAMUSCULAR | Status: DC | PRN

## 2020-06-17 MED ORDER — PARAGARD INTRAUTERINE COPPER IU IUD: INTRAUTERINE_SYSTEM | Freq: Once | INTRAUTERINE | Status: DC

## 2020-06-17 MED ORDER — SODIUM CHLORIDE 0.9 % IV SOLN: 5.0000 10*6.[IU] | Freq: Once | INTRAVENOUS | Status: DC

## 2020-06-17 MED ORDER — OXYTOCIN-SODIUM CHLORIDE 30-0.9 UT/500ML-% IV SOLN
2.5000 [IU]/h | INTRAVENOUS | Status: DC
  Filled 2020-06-17: qty 500

## 2020-06-17 MED ORDER — EPHEDRINE 5 MG/ML INJ: 10.0000 mg | INTRAVENOUS | Status: DC | PRN

## 2020-06-17 NOTE — Plan of Care (Signed)

## 2020-06-17 NOTE — Anesthesia Preprocedure Evaluation (Deleted)
Anesthesia Evaluation  Patient identified by MRN, date of birth, ID band Patient awake    Reviewed: Allergy & Precautions, Patient's Chart, lab work & pertinent test results, reviewed documented beta blocker date and time   Airway Mallampati: III  TM Distance: >3 FB Neck ROM: Full    Dental no notable dental hx. (+) Teeth Intact   Pulmonary pneumonia, resolved, Current Smoker,    Pulmonary exam normal breath sounds clear to auscultation       Cardiovascular hypertension, Pt. on medications and Pt. on home beta blockers Normal cardiovascular exam Rhythm:Regular Rate:Normal     Neuro/Psych negative neurological ROS  negative psych ROS   GI/Hepatic Neg liver ROS, GERD  ,  Endo/Other  diabetes, Poorly Controlled, Type 2, Oral Hypoglycemic AgentsMorbid obesityPCOS  Renal/GU negative Renal ROS  negative genitourinary   Musculoskeletal negative musculoskeletal ROS (+)   Abdominal (+) + obese,   Peds  Hematology negative hematology ROS (+)   Anesthesia Other Findings   Reproductive/Obstetrics (+) Pregnancy                            Anesthesia Physical Anesthesia Plan  ASA: III  Anesthesia Plan: Epidural   Post-op Pain Management:    Induction:   PONV Risk Score and Plan:   Airway Management Planned: Natural Airway  Additional Equipment:   Intra-op Plan:   Post-operative Plan:   Informed Consent: I have reviewed the patients History and Physical, chart, labs and discussed the procedure including the risks, benefits and alternatives for the proposed anesthesia with the patient or authorized representative who has indicated his/her understanding and acceptance.       Plan Discussed with: Anesthesiologist  Anesthesia Plan Comments: (Patient delivered prior to epidural placement. Procedure not performed.)       Anesthesia Quick Evaluation

## 2020-06-17 NOTE — Progress Notes (Signed)
Vitals:   06/17/20 0700 06/17/20 0740 06/17/20 1321 06/17/20 1732  BP:  (!) 133/97 132/86   Pulse:  (!) 104 85   Resp:  19  20  Temp:    98.1 F (36.7 C)  TempSrc:    Oral  Weight: 108 kg      Patient doing well, reports occasional cramping but not strong contractions   Cervix rechecked and unchanged at this time, AROM performed @1732  - bloody  Dilation: 3 Effacement (%): 60 Station: -2 Exam by:: [email or web address], CNM  Continue to titrate pitocin to active labor - pitocin currently at 28 milli-unit/min  FHR 145/moderate/+accels/ variable decel  Toco- 2-3 minutes/ mild by palpation   CBG 197 then 221 , patient admits to sneaking BBQ sandwich Educated and discussed management of DM during pregnancy and labor, Endotool ordered for initiation.    Steward Drone, CNM 06/17/20, 6:22 PM

## 2020-06-17 NOTE — Progress Notes (Signed)
LABOR PROGRESS NOTE Michelle Houston is a 40 y.o. [id number] at [redacted]w[redacted]d  admitted for IOL for cHTN and A2GDM   Subjective: Patient doing well: feeling strong contractions, but breathing through them well.  Objective: BP (!) 154/101   Pulse 92   Temp 97.9 F (36.6 C) (Oral)   Resp 18   Wt 108 kg   LMP 10/02/2019   BMI 44.28 kg/m  or  Vitals:   06/17/20 1732 06/17/20 1930 06/17/20 2039 06/17/20 2147  BP:  138/79 139/87 (!) 154/101  Pulse:  84 84 92  Resp: 20  18 18   Temp: 98.1 F (36.7 C) 97.9 F (36.6 C)  97.9 F (36.6 C)  TempSrc: Oral Oral  Oral  Weight:        Pitocin currently on 93milli-unit/min  Dilation: 5 Effacement (%): 60 Station: -2 Presentation: Vertex Exam by:: [email or web address], RN FHT: baseline rate 150, moderate varibility, +accel, no decel Toco: 3-5/mild by palpation   Labs: Lab Results  Component Value Date   WBC 5.2 06/17/2020   HGB 13.1 06/17/2020   HCT 38.7 06/17/2020   MCV 85.4 06/17/2020   PLT 222 06/17/2020    Patient Active Problem List   Diagnosis Date Noted  . Diabetes mellitus affecting pregnancy 06/17/2020  . BMI 45.0-49.9, adult (HCC) 05/17/2020  . Maternal obesity affecting pregnancy, antepartum 12/14/2019  . Encounter for supervision of high risk multigravida of advanced maternal age, antepartum 12/07/2019  . Chronic hypertension during pregnancy, antepartum 11/14/2016  . Diabetes mellitus affecting pregnancy, antepartum 10/17/2016  . AMA (advanced maternal age) multigravida 35+ 09/30/2016  . DM (diabetes mellitus) (HCC) 09/19/2016  . History of bladder repair surgery 09/19/2016  . H/O carpal tunnel repair 09/19/2016  . Obese 09/19/2016  . Chronic hypertension 10/13/2013    Assessment / Plan: 40 y.o. 40 at [redacted]w[redacted]d here for IOL   Labor: IOL with pitocin, continue to titrate pitocin to active labor, plan to rupture at next cervical examination  Fetal Wellbeing:  Cat I  Pain Control:  Pain medication PRN  Anticipated MOD:   SVD CHTN: One mild range BP 150s/100s. PreE labs . Continue to monitor. A2GDM: CBG elevated to 181>221 most recently improved to 148. Patient admitted to sneaking [facility]. Educated on DM management during labor. Now on Endotool with more frequent CBG monitoring as directed by protocol.  [facility], MD 06/17/2020, 10:03 PM

## 2020-06-17 NOTE — H&P (Signed)
OBSTETRIC ADMISSION HISTORY AND PHYSICAL  Michelle Houston is a 40 y.o. female [phone] with IUP at 23w0dby LMP c/w 5w [place] for I[place] d/t GDMA2. She reports +FMs, No LOF, no VB, no blurry vision, headaches or peripheral edema, and RUQ pain.  She plans on breast feeding. She request IUD (paraguard) for birth control. [facility] Dating: By LMP c/w 7w [place]--->  Estimated Date of Delivery: 07/08/20  Sono:    <<[id number]>_4<\/IHKVQQVZDGLOVFIE>_3, cephalic presentation, posterior placental lie, Est. 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 Bronchitis   . Diabetes mellitus without complication ([place]   "being watched for it"  . Hypertension   . Obesity   . PCOS (polycystic ovarian syndrome)   . Pneumonia     Past Surgical History: Past Surgical History:  Procedure Laterality Date  . BLADDER REPAIR    . CARPAL TUNNEL RELEASE    . CHOLECYSTECTOMY      Obstetrical History: OB History    Gravida  6   Para  4   Term  4   Preterm      AB  1   Living  4     SAB  1   IAB      Ectopic      Multiple  0   Live Births  4           Social History Social History   Socioeconomic History  . Marital status: Legally Separated    Spouse name: Not on file  . Number of children: Not on file  . Years of education: Not on file  . Highest education level: Not on file  Occupational History  . Not on file  Tobacco Use  . Smoking status: Current Every Day Smoker    Packs/day: 0.25    Years: 18.00    Pack years: 4.50    Types: Cigarettes  . Smokeless tobacco: Never Used  . Tobacco comment: 10 a day  Vaping Use  . Vaping Use: Never used  Substance and Sexual Activity  . Alcohol use: No  . Drug use: No  .  Sexual activity: Yes    Partners: Male  Other Topics Concern  . Not on file  Social History Narrative  . Not on file   Social Determinants of Health   Financial Resource Strain: Not on file  Food Insecurity: Not on file  Transportation Needs: Not on file  Physical Activity: Not on file  Stress: Not on file  Social Connections: Not on file    Family History: Family History  Problem Relation Age of Onset  . Cancer Maternal Grandmother   . Hypertension Mother   . Fibroids Mother   . Diabetes Paternal Aunt   . Stroke Maternal Grandfather   . Diabetes Paternal Grandmother   . Stroke Paternal Grandmother   . Cancer Father        lung  . Heart disease Paternal Grandfather     Allergies: No Known Allergies  Medications Prior to Admission  Medication Sig Dispense Refill Last Dose  . Accu-Chek Softclix Lancets lancets Use as instructed 100 each 12 06/16/2020 at Unknown time  . aspirin EC 81 MG tablet  Take 1 tablet (81 mg total) by mouth daily. Take after 12 weeks for prevention of preeclampsia later in pregnancy 300 tablet 2 06/17/2020 at 0600  . Blood Glucose Monitoring Suppl (ACCU-CHEK GUIDE) w/Device KIT 1 Device by Does not apply route 4 (four) times daily. 1 kit 0 06/16/2020 at Unknown time  . cyclobenzaprine (FLEXERIL) 10 MG tablet Take 1 tablet (10 mg total) by mouth every 8 (eight) hours as needed for muscle spasms. 30 tablet 1 Past Month at Unknown time  . glucose blood (ACCU-CHEK GUIDE) test strip Use to check blood sugars four times a day was instructed 50 each 12 06/16/2020 at Unknown time  . labetalol (NORMODYNE) 100 MG tablet Take 1 tablet (100 mg total) by mouth 2 (two) times daily. 60 tablet 3 06/17/2020 at 0600  . metFORMIN (GLUCOPHAGE) 500 MG tablet Take 1 tablet (500 mg total) by mouth 2 (two) times daily with a meal. (Patient taking differently: Take 1,000 mg by mouth 2 (two) times daily with a meal.) 60 tablet 2 06/16/2020 at 2030  . Prenatal Vit-Fe Fumarate-FA  (PRENATAL MULTIVITAMIN) TABS tablet Take 1 tablet by mouth daily at 12 noon.   06/16/2020 at 1000  . ipratropium-albuterol (DUONEB) 0.5-2.5 (3) MG/3ML SOLN Take 3 mLs by nebulization every 4 (four) hours as needed. 360 mL 0      Review of Systems   All systems reviewed and negative except as stated in HPI  Blood pressure (!) 133/97, pulse (!) 104, resp. rate 19, weight 108 kg, last menstrual period 10/02/2019. General appearance: alert, cooperative, no distress and moderately obese Lungs: clear to auscultation bilaterally Heart: regular rate and rhythm Abdomen: soft, non-tender; bowel sounds normal Extremities: Homans sign is negative, no sign of DVT DTR's +2 Presentation: cephalic Fetal monitoringBaseline: 150 bpm, Variability: Good {> 6 bpm), Accelerations: Reactive and Decelerations: Variable Uterine activity: irregular uterine contractions, mild by palpation  Dilation: 2.5 Effacement (%): 50 Station: -3 Exam by:: Lamont Dowdy, RN   Prenatal labs: ABO, Rh: --/--/A POS (01/28 0751) Antibody: NEG (01/28 0751) Rubella: 1.18 (07/26 1055) RPR: Non Reactive (11/30 0944)  HBsAg: Negative (07/26 1055)  HIV: Non Reactive (11/30 0944)  GBS: Negative/-- (01/19 1111)  1 hr Glucola - not done, known pre-existing DMT2, A1c 6.6% Genetic screening  LR NIPS Anatomy US normal  Prenatal Transfer Tool  Maternal Diabetes: Yes:  Diabetes Type:  Insulin/Medication controlled Genetic Screening: Normal Maternal Ultrasounds/Referrals: Normal Fetal Ultrasounds or other Referrals:  Referred to Materal Fetal Medicine  Maternal Substance Abuse:  No Significant Maternal Medications:  Meds include: Other:  metformin, glyburide Significant Maternal Lab Results: Group B Strep negative  Results for orders placed or performed during the hospital encounter of 06/17/20 (from the past 24 hour(s))  CBC   Collection Time: 06/17/20  7:51 AM  Result Value Ref Range   WBC 5.2 4.0 - 10.5 K/uL   RBC 4.53  3.87 - 5.11 MIL/uL   Hemoglobin 13.1 12.0 - 15.0 g/dL   HCT 38.7 36.0 - 46.0 %   MCV 85.4 80.0 - 100.0 fL   MCH 28.9 26.0 - 34.0 pg   MCHC 33.9 30.0 - 36.0 g/dL   RDW 13.5 11.5 - 15.5 %   Platelets 222 150 - 400 K/uL   nRBC 0.0 0.0 - 0.2 %  Comprehensive metabolic panel   Collection Time: 06/17/20  7:51 AM  Result Value Ref Range   Sodium 130 (L) 135 - 145 mmol/L   Potassium 4.0 3.5 - 5.1 mmol/L  Chloride 99 98 - 111 mmol/L   CO2 20 (L) 22 - 32 mmol/L   Glucose, Bld 138 (H) 70 - 99 mg/dL   BUN 8 6 - 20 mg/dL   Creatinine, Ser 0.67 0.44 - 1.00 mg/dL   Calcium 8.9 8.9 - 10.3 mg/dL   Total Protein 6.3 (L) 6.5 - 8.1 g/dL   Albumin 2.5 (L) 3.5 - 5.0 g/dL   AST 32 15 - 41 U/L   ALT 33 0 - 44 U/L   Alkaline Phosphatase 172 (H) 38 - 126 U/L   Total Bilirubin 0.6 0.3 - 1.2 mg/dL   GFR, Estimated >60 >60 mL/min   Anion gap 11 5 - 15  Type and screen   Collection Time: 06/17/20  7:51 AM  Result Value Ref Range   ABO/RH(D) A POS    Antibody Screen NEG    Sample Expiration      06/20/2020,2359 Performed at [facility] [address] [place], [place] [address]     Patient Active Problem List   Diagnosis Date Noted  . Gestational diabetes mellitus (GDM) in third trimester controlled on oral hypoglycemic drug 06/17/2020  . BMI 45.0-49.9, adult ([place]) 05/17/2020  . Maternal obesity affecting pregnancy, antepartum 12/14/2019  . Encounter for supervision of high risk multigravida of advanced maternal age, antepartum 12/07/2019  . Chronic hypertension during pregnancy, antepartum 11/14/2016  . Diabetes mellitus affecting pregnancy, antepartum 10/17/2016  . AMA (advanced maternal age) multigravida 35+ 09/30/2016  . DM (diabetes mellitus) ([place]) 09/19/2016  . History of bladder repair surgery 09/19/2016  . H/O carpal tunnel repair 09/19/2016  . Obese 09/19/2016  . Chronic hypertension 10/13/2013    Assessment/Plan:  Michelle Houston is a 40 y.o. [id number] at 27w0dhere for  IOL d/t A2GDM and pre-existing diabetes.  #Induction of Labor: Patient being induced as she has A2GDM and found to be LGA on [place]1/19/22 to 98th%ile. IOL with pitocin due to patient being 2.5cm. Discussed with patient plan of care and possible AROM around 4/5cm if patient does not spontaneous rupture.  #Pain: Patient unsure about epidural, discussed IV pain medication during labor - patient agrees to IV pain medication PRN  #FWB: Cat II  #ID:  GBS neg #MOF: breast #MOC: Post placental IUD paraguard #Circ:  Yes, inpatient  #A2GDM: Patient takes metformin. CBG q4h in early labor, CBG q2h in active labor. #cHTN: well controlled on labetalol. Repeat PEC labs ordered today.   VLajean Manes CNM  06/17/2020, 9:45 AM

## 2020-06-17 NOTE — Progress Notes (Signed)
LABOR PROGRESS NOTE  Michelle Houston is a 40 y.o. [phone] at [redacted]w[redacted]d  admitted for IOL for CHTN and A2GDM   Subjective: Patient doing well, reports occasional cramping   Objective: BP (!) 133/97 (BP Location: Right Arm)   Pulse (!) 104   Resp 19   Wt 108 kg   LMP 10/02/2019   BMI 44.28 kg/m  or  Vitals:   06/17/20 0700 06/17/20 0740 06/17/20 1321  BP:  (!) 133/97 132/86  Pulse:  (!) 104 85  Resp:  19   Weight: 108 kg      Pitocin currently on 61milli-unit/min  Dilation: 3 Effacement (%): 50 Station: -3 Exam by:: Marti Sleigh, RN FHT: baseline rate 150, moderate varibility, +accel, no decel Toco: 3-5/mild by palpation   Labs: Lab Results  Component Value Date   WBC 5.2 06/17/2020   HGB 13.1 06/17/2020   HCT 38.7 06/17/2020   MCV 85.4 06/17/2020   PLT 222 06/17/2020    Patient Active Problem List   Diagnosis Date Noted  . Gestational diabetes mellitus (GDM) in third trimester controlled on oral hypoglycemic drug 06/17/2020  . BMI 45.0-49.9, adult (HCC) 05/17/2020  . Maternal obesity affecting pregnancy, antepartum 12/14/2019  . Encounter for supervision of high risk multigravida of advanced maternal age, antepartum 12/07/2019  . Chronic hypertension during pregnancy, antepartum 11/14/2016  . Diabetes mellitus affecting pregnancy, antepartum 10/17/2016  . AMA (advanced maternal age) multigravida 35+ 09/30/2016  . DM (diabetes mellitus) (HCC) 09/19/2016  . History of bladder repair surgery 09/19/2016  . H/O carpal tunnel repair 09/19/2016  . Obese 09/19/2016  . Chronic hypertension 10/13/2013    Assessment / Plan: 40 y.o. [id number] at [redacted]w[redacted]d here for IOL   Labor: IOL with pitocin, continue to titrate pitocin to active labor, plan to rupture at next cervical examination  Fetal Wellbeing:  Cat I  Pain Control:  Pain medication PRN  Anticipated MOD:  SVD CHTN: continue to monitor BP, PCR needs to be collected  A2GDM: CBG 99, continue to monitor every 4 hours  until active labor   Sharyon Cable, CNM 06/17/2020, 1:46 PM

## 2020-06-18 ENCOUNTER — Encounter (HOSPITAL_COMMUNITY): Payer: Self-pay | Admitting: Obstetrics and Gynecology

## 2020-06-18 LAB — RPR: RPR Ser Ql: NONREACTIVE

## 2020-06-18 LAB — GLUCOSE, CAPILLARY
Glucose-Capillary: 213 mg/dL — ABNORMAL HIGH (ref 70–99)
Glucose-Capillary: 152 mg/dL — ABNORMAL HIGH (ref 70–99)
Glucose-Capillary: 147 mg/dL — ABNORMAL HIGH (ref 70–99)
Glucose-Capillary: 141 mg/dL — ABNORMAL HIGH (ref 70–99)

## 2020-06-18 MED ORDER — SENNOSIDES-DOCUSATE SODIUM 8.6-50 MG PO TABS
2.0000 | ORAL_TABLET | ORAL | Status: DC
  Administered 2020-06-18: 2 via ORAL
  Filled 2020-06-18 (×2): qty 2

## 2020-06-18 MED ORDER — ONDANSETRON HCL 4 MG PO TABS: 4.0000 mg | ORAL_TABLET | ORAL | Status: DC | PRN

## 2020-06-18 MED ORDER — IBUPROFEN 600 MG PO TABS
600.0000 mg | ORAL_TABLET | Freq: Four times a day (QID) | ORAL | Status: DC
  Administered 2020-06-18 – 2020-06-19 (×4): 600 mg via ORAL
  Filled 2020-06-18 (×5): qty 1

## 2020-06-18 MED ORDER — AMLODIPINE BESYLATE 5 MG PO TABS
5.0000 mg | ORAL_TABLET | Freq: Every day | ORAL | Status: DC
  Filled 2020-06-18: qty 1

## 2020-06-18 MED ORDER — BENZOCAINE-MENTHOL 20-0.5 % EX AERO: 1.0000 "application " | INHALATION_SPRAY | CUTANEOUS | Status: DC | PRN

## 2020-06-18 MED ORDER — DOCUSATE SODIUM 100 MG PO CAPS
100.0000 mg | ORAL_CAPSULE | Freq: Two times a day (BID) | ORAL | Status: DC
  Administered 2020-06-19: 100 mg via ORAL
  Filled 2020-06-18: qty 1

## 2020-06-18 MED ORDER — DIPHENHYDRAMINE HCL 25 MG PO CAPS
25.0000 mg | ORAL_CAPSULE | Freq: Four times a day (QID) | ORAL | Status: DC | PRN
Start: 2020-06-18 — End: 2020-06-19

## 2020-06-18 MED ORDER — ACETAMINOPHEN 325 MG PO TABS: 650.0000 mg | ORAL_TABLET | ORAL | Status: DC | PRN

## 2020-06-18 MED ORDER — COCONUT OIL OIL: 1.0000 "application " | TOPICAL_OIL | Status: DC | PRN

## 2020-06-18 MED ORDER — WITCH HAZEL-GLYCERIN EX PADS: 1.0000 "application " | MEDICATED_PAD | CUTANEOUS | Status: DC | PRN

## 2020-06-18 MED ORDER — TETANUS-DIPHTH-ACELL PERTUSSIS 5-2.5-18.5 LF-MCG/0.5 IM SUSY: 0.5000 mL | PREFILLED_SYRINGE | Freq: Once | INTRAMUSCULAR | Status: DC

## 2020-06-18 MED ORDER — ONDANSETRON HCL 4 MG/2ML IJ SOLN: 4.0000 mg | INTRAMUSCULAR | Status: DC | PRN

## 2020-06-18 MED ORDER — DIBUCAINE (PERIANAL) 1 % EX OINT: 1.0000 "application " | TOPICAL_OINTMENT | CUTANEOUS | Status: DC | PRN

## 2020-06-18 MED ORDER — METFORMIN HCL ER 500 MG PO TB24
500.0000 mg | ORAL_TABLET | Freq: Two times a day (BID) | ORAL | Status: DC
  Administered 2020-06-18 – 2020-06-19 (×3): 500 mg via ORAL
  Filled 2020-06-18 (×5): qty 1

## 2020-06-18 MED ORDER — PRENATAL MULTIVITAMIN CH
1.0000 | ORAL_TABLET | Freq: Every day | ORAL | Status: DC
  Administered 2020-06-19: 1 via ORAL
  Filled 2020-06-18 (×2): qty 1

## 2020-06-18 MED ORDER — MEASLES, MUMPS & RUBELLA VAC IJ SOLR: 0.5000 mL | Freq: Once | INTRAMUSCULAR | Status: DC

## 2020-06-18 MED ORDER — INSULIN ASPART 100 UNIT/ML ~~LOC~~ SOLN
0.0000 [IU] | Freq: Three times a day (TID) | SUBCUTANEOUS | Status: DC
  Administered 2020-06-18: 4 [IU] via SUBCUTANEOUS
  Administered 2020-06-18: 7 [IU] via SUBCUTANEOUS
  Administered 2020-06-18: 3 [IU] via SUBCUTANEOUS
  Administered 2020-06-19: 4 [IU] via SUBCUTANEOUS

## 2020-06-18 MED ORDER — SIMETHICONE 80 MG PO CHEW: 80.0000 mg | CHEWABLE_TABLET | ORAL | Status: DC | PRN

## 2020-06-18 NOTE — Discharge Summary (Signed)
Postpartum Discharge Summary    Patient Name: Michelle Houston DOB: 04-06-1981 MRN: [id number]  Date of admission: 06/17/2020 Delivery date:06/17/2020  Delivering provider: Janet Berlin  Date of discharge: 06/19/2020  Admitting diagnosis: Gestational diabetes mellitus (GDM) in third trimester controlled on oral hypoglycemic drug [O24.415] Intrauterine pregnancy: [redacted]w[redacted]d    Secondary diagnosis:  Active Problems:   Diabetes mellitus affecting pregnancy  Additional problems: n/a    Discharge diagnosis: Term Pregnancy Delivered, CHTN and Type 2 DM                                              Post partum procedures:n/a Augmentation: AROM and Pitocin Complications: None  Hospital course: Induction of Labor With Vaginal Delivery   40y.o. yo [id number] [id number] admitted to the hospital 06/17/2020 for induction of labor.  Indication for induction: TYPE 2 DM.  Patient had an uncomplicated labor course as follows: Membrane Rupture Time/Date: 5:32 PM ,06/17/2020   Delivery Method:Vaginal, Spontaneous  Episiotomy: None  Lacerations:  None  Details of delivery can be found in separate delivery note.  Patient had a routine postpartum course. Patient is discharged home 06/19/20.  Newborn Data: Birth date:06/17/2020  Birth time:11:28 PM  Gender:Female  Living status:Living  Apgars:7 ,9  [id number]   Magnesium Sulfate received: No BMZ received: No Rhophylac:N/A MMR:N/A T-DaP:declined  Flu: No Transfusion:No  Physical exam  Vitals:   06/18/20 0645 06/18/20 1240 06/18/20 2140 06/19/20 0500  BP: 123/77 (!) 93/40 121/73 112/69  Pulse: (!) 102 84 96 87  Resp: _0 Temp:  97.7 F (36.5 C) 97.6 F (36.4 C)   TempSrc:   Oral   SpO2: 100%   100%  Weight:       General: alert, cooperative and no distress Lochia: appropriate Uterine Fundus: firm Incision: N/A DVT Evaluation: No evidence of DVT seen on physical exam. Labs: Lab Results  Component Value Date   WBC 6.4  06/17/2020   HGB 12.9 06/17/2020   HCT 39.6 06/17/2020   MCV 88.0 06/17/2020   PLT 222 06/17/2020   CMP Latest Ref Rng & Units 06/17/2020  Glucose 70 - 99 mg/dL 194(H)  BUN 6 - 20 mg/dL 6  Creatinine 0.44 - 1.00 mg/dL 0.77  Sodium 135 - 145 mmol/L 130(L)  Potassium 3.5 - 5.1 mmol/L 3.6  Chloride 98 - 111 mmol/L 100  CO2 22 - 32 mmol/L 19(L)  Calcium 8.9 - 10.3 mg/dL 8.6(L)  Total Protein 6.5 - 8.1 g/dL -  Total Bilirubin 0.3 - 1.2 mg/dL -  Alkaline Phos 38 - 126 U/L -  AST 15 - 41 U/L -  ALT 0 - 44 U/L -   Edinburgh Score: Edinburgh Postnatal Depression Scale Screening Tool 06/18/2020  I have been able to laugh and see the funny side of things. 0  I have looked forward with enjoyment to things. 0  I have blamed myself unnecessarily when things went wrong. 0  I have been anxious or worried for no good reason. 0  I have felt scared or panicky for no good reason. 0  Things have been getting on top of me. 0  I have been so unhappy that I have had difficulty sleeping. 0  I have felt sad or miserable. 0  I have been so unhappy that I have been crying.  0  The thought of harming myself has occurred to me. 0  Edinburgh Postnatal Depression Scale Total 0     After visit meds:  Allergies as of 06/19/2020   No Known Allergies     Medication List    TAKE these medications   Accu-Chek Guide test strip Generic drug: glucose blood Use to check blood sugars four times a day was instructed   Accu-Chek Guide w/Device Kit 1 Device by Does not apply route 4 (four) times daily.   Accu-Chek Softclix Lancets lancets Use as instructed   aspirin EC 81 MG tablet Take 1 tablet (81 mg total) by mouth daily. Take after 12 weeks for prevention of preeclampsia later in pregnancy   cyclobenzaprine 10 MG tablet Commonly known as: FLEXERIL Take 1 tablet (10 mg total) by mouth every 8 (eight) hours as needed for muscle spasms.   ibuprofen 600 MG tablet Commonly known as: ADVIL Take 1 tablet  (600 mg total) by mouth every 6 (six) hours.   ipratropium-albuterol 0.5-2.5 (3) MG/3ML Soln Commonly known as: DUONEB Take 3 mLs by nebulization every 4 (four) hours as needed.   labetalol 100 MG tablet Commonly known as: NORMODYNE Take 1 tablet (100 mg total) by mouth 2 (two) times daily.   metFORMIN 500 MG tablet Commonly known as: GLUCOPHAGE Take 2 tablets (1,000 mg total) by mouth 2 (two) times daily with a meal.   prenatal multivitamin Tabs tablet Take 1 tablet by mouth daily at 12 noon.        Discharge home in stable condition Infant Feeding: Breast Infant Disposition:home with mother Discharge instruction: per After Visit Summary and Postpartum booklet. Activity: Advance as tolerated. Pelvic rest for 6 weeks.  Diet: routine diet Future Appointments:No future appointments. Follow up Visit:  [facility] Follow up.   Why: In 4 weeks for a postpartum appointment Contact information: [place] [address] [phone]               Please schedule this patient for a In person postpartum visit in 6 weeks with the following provider: MD. Additional Postpartum F/U  BP check 1 week, DM2 follow up  High risk pregnancy complicated by: DM2, CHTN Delivery mode:  Vaginal, Spontaneous  Anticipated Birth Control: PARAGARD IUD at postpartum visit    Consulted with Dr. Harolyn Rutherford- will discharge home on 105m metformin and instructed patient to follow up with PCP as soon as possible for further management.   06/19/2020 CWende Houston CNM

## 2020-06-18 NOTE — Progress Notes (Signed)
POSTPARTUM PROGRESS NOTE  Subjective: Michelle Houston is a 40 y.o. [id number] s/p SVD at [redacted]w[redacted]d.  She reports she doing well. No acute events overnight. She denies any problems with ambulating, voiding or po intake. Denies nausea or vomiting. She has not passed flatus. Pain is well controlled.  Lochia is moderate.  Objective: Blood pressure 115/77, pulse 93, temperature (!) 97.5 F (36.4 C), temperature source Oral, resp. rate 19, weight 108 kg, last menstrual period 10/02/2019, SpO2 99 %, unknown if currently breastfeeding.  Physical Exam:  General: alert, cooperative and no distress Chest: no respiratory distress Abdomen: soft, non-tender  Uterine Fundus: firm and at level of umbilicus Extremities: No calf swelling or tenderness  no edema  Recent Labs    06/17/20 0751 06/17/20 2245  HGB 13.1 12.9  HCT 38.7 39.6    Assessment/Plan: Michelle Houston is a 40 y.o. [id number] s/p SVD at [redacted]w[redacted]d. PPD#1, delivered at 2330  Routine Postpartum Care: Doing well, pain well-controlled.  -- Continue routine care, lactation support  -- Contraception: desires paragard IUD as outpatient  -- Feeding: breast  -- Circumcision: Desires. Did not discuss today.   --Chronic HTN: d/c labetalol, start norvasc 5mg .  --DM2: restarted on metformin (had been on pre-pregnancy). Will monitor CBG today w meals given persistently elevated.      Dispo: Plan for discharge PPD#2.  , MD OB Fellow, [facility] 06/18/2020 6:07 AM

## 2020-06-18 NOTE — Progress Notes (Signed)
Inpatient Diabetes Program Recommendations  AACE/ADA: New Consensus Statement on Inpatient Glycemic Control (2015)  Target Ranges:  Prepandial:   less than 140 mg/dL      Peak postprandial:   less than 180 mg/dL (1-2 hours)      Critically ill patients:  140 - 180 mg/dL   Lab Results  Component Value Date   GLUCAP 213 (H) 06/18/2020   HGBA1C 7.2 (H) 06/17/2020    Review of Glycemic Control Results for ANNALIZA, ZIA (MRN [id number]) as of 06/18/2020 11:00  Ref. Range 06/17/2020 21:38 06/17/2020 22:34 06/17/2020 23:47 06/18/2020 08:04  Glucose-Capillary Latest Ref Range: 70 - 99 mg/dL 492 (H) 010 (H) 071 (H) 213 (H)   Diabetes history: DM 2 Outpatient Diabetes medications: Metformin 1000 mg bid Current orders for Inpatient glycemic control:  Metformin 500 mg bid Novolog resistant tid with meals  Inpatient Diabetes Program Recommendations:   Agree with current orders.  Will follow.   Thanks  Beryl Meager, RN, BC-ADM Inpatient Diabetes Coordinator Pager [phone] (8a-5p)

## 2020-06-18 NOTE — Lactation Note (Addendum)
This note was copied from a baby's chart. Lactation Consultation Note  Patient Name: Michelle Houston Today's Date: 06/18/2020 Reason for consult: L&D Initial assessment;Early term 37-38.6wks Age:41 hours , P5, ETI female infant. (L&D- No charge for [facility] services) Mom with hx: GDM Per mom, she active on the [facility] in [place] but doesn't have breast pump at home.  LC entered the room, mom was doing STS and infant had pacifier in mouth.  Per mom, she BF her 3rd child for 3 months and 4th child briefly only for 2 weeks due to latch difficulties.  Mom attempted to latch infant on her right breast using the football hold, infant sucks his  top lip in and not opening his  mouth at this time,  mainly kisses breast, mom made few attempts to latch. Mom did hand expression and infant was given 8 mls of colostrum by spoon. Mom will continue to work towards latching infant at the breast. LC will place hand pump in room on MBU. Mom knows to breastfeed infant according primal cues: licking, tasting, hands in mouth and rooting. Mom will hand express and give infant back volume if still not latching at breast and to  ask RN on MBU or LC for assistance if needed. LC discussed infant's input and output with parents. LC briefly discussed LC services within the local community.  Maternal Data Formula Feeding for Exclusion: No Has patient been taught Hand Expression?: Yes Does the patient have breastfeeding experience prior to this delivery?: Yes  Feeding Feeding Type: Breast Fed  LATCH Score Latch: Too sleepy or reluctant, no latch achieved, no sucking elicited.  Audible Swallowing: None  Type of Nipple: Everted at rest and after stimulation  Comfort (Breast/Nipple): Soft / non-tender  Hold (Positioning): Assistance needed to correctly position infant at breast and maintain latch.  LATCH Score: 5  Interventions Interventions: Breast feeding basics reviewed;Assisted with latch;Skin to  skin;Adjust position;Breast massage;Support pillows;Hand express;Position options;Expressed milk;Hand pump;Breast compression  Lactation Tools Discussed/Used WIC Program: Yes Pump Education: Setup, frequency, and cleaning;Milk Storage Initiated by:: RN Date initiated:: 06/18/20   Consult Status Consult Status: Follow-up Date: 06/18/20 Follow-up type: In-patient    Danelle Earthly 06/18/2020, 12:55 AM

## 2020-06-19 LAB — GLUCOSE, CAPILLARY: Glucose-Capillary: 163 mg/dL — ABNORMAL HIGH (ref 70–99)

## 2020-06-19 MED ORDER — IBUPROFEN 600 MG PO TABS: 600.0000 mg | ORAL_TABLET | Freq: Four times a day (QID) | ORAL | 0 refills | Status: DC

## 2020-06-19 MED ORDER — METFORMIN HCL 500 MG PO TABS
1000.0000 mg | ORAL_TABLET | Freq: Two times a day (BID) | ORAL | 2 refills | Status: DC
Start: 2020-06-19 — End: 2023-10-14

## 2020-06-19 NOTE — Discharge Instructions (Signed)

## 2020-06-22 ENCOUNTER — Ambulatory Visit: Payer: Medicaid Other

## 2020-06-24 ENCOUNTER — Ambulatory Visit: Payer: Medicaid Other

## 2020-06-29 ENCOUNTER — Ambulatory Visit: Payer: Medicaid Other

## 2020-07-06 ENCOUNTER — Ambulatory Visit: Payer: Medicaid Other

## 2020-07-10 ENCOUNTER — Encounter (HOSPITAL_COMMUNITY): Payer: Self-pay | Admitting: Emergency Medicine

## 2020-07-10 ENCOUNTER — Emergency Department (HOSPITAL_BASED_OUTPATIENT_CLINIC_OR_DEPARTMENT_OTHER)
Admission: EM | Admit: 2020-07-10 | Discharge: 2020-07-10 | Disposition: A | Discharge status: Home / Self Care | Payer: Medicaid Other | Source: Home / Self Care | Attending: Emergency Medicine | Admitting: Emergency Medicine

## 2020-07-10 ENCOUNTER — Emergency Department (HOSPITAL_COMMUNITY)
Admission: EM | Admit: 2020-07-10 | Discharge: 2020-07-10 | Disposition: A | Discharge status: Home / Self Care | Payer: Medicaid Other | Attending: Emergency Medicine | Admitting: Emergency Medicine

## 2020-07-10 ENCOUNTER — Emergency Department (HOSPITAL_BASED_OUTPATIENT_CLINIC_OR_DEPARTMENT_OTHER): Payer: Medicaid Other

## 2020-07-10 ENCOUNTER — Emergency Department (HOSPITAL_COMMUNITY): Payer: Medicaid Other

## 2020-07-10 ENCOUNTER — Other Ambulatory Visit: Payer: Self-pay

## 2020-07-10 ENCOUNTER — Encounter (HOSPITAL_BASED_OUTPATIENT_CLINIC_OR_DEPARTMENT_OTHER): Payer: Self-pay | Admitting: Emergency Medicine

## 2020-07-10 DIAGNOSIS — K2100 Gastro-esophageal reflux disease with esophagitis, without bleeding: Secondary | ICD-10-CM

## 2020-07-10 DIAGNOSIS — F17210 Nicotine dependence, cigarettes, uncomplicated: Secondary | ICD-10-CM | POA: Insufficient documentation

## 2020-07-10 DIAGNOSIS — R2241 Localized swelling, mass and lump, right lower limb: Secondary | ICD-10-CM | POA: Insufficient documentation

## 2020-07-10 DIAGNOSIS — M7989 Other specified soft tissue disorders: Secondary | ICD-10-CM | POA: Diagnosis not present

## 2020-07-10 DIAGNOSIS — Z79899 Other long term (current) drug therapy: Secondary | ICD-10-CM | POA: Insufficient documentation

## 2020-07-10 DIAGNOSIS — Z7982 Long term (current) use of aspirin: Secondary | ICD-10-CM | POA: Insufficient documentation

## 2020-07-10 DIAGNOSIS — Z7984 Long term (current) use of oral hypoglycemic drugs: Secondary | ICD-10-CM | POA: Insufficient documentation

## 2020-07-10 DIAGNOSIS — O99893 Other specified diseases and conditions complicating puerperium: Secondary | ICD-10-CM | POA: Insufficient documentation

## 2020-07-10 DIAGNOSIS — R079 Chest pain, unspecified: Secondary | ICD-10-CM | POA: Diagnosis not present

## 2020-07-10 DIAGNOSIS — E119 Type 2 diabetes mellitus without complications: Secondary | ICD-10-CM | POA: Diagnosis not present

## 2020-07-10 DIAGNOSIS — I10 Essential (primary) hypertension: Secondary | ICD-10-CM | POA: Diagnosis not present

## 2020-07-10 DIAGNOSIS — R0602 Shortness of breath: Secondary | ICD-10-CM | POA: Insufficient documentation

## 2020-07-10 LAB — CBC
HCT: 39.8 % (ref 36.0–46.0)
Hemoglobin: 12.5 g/dL (ref 12.0–15.0)
MCH: 28.3 pg (ref 26.0–34.0)
MCHC: 31.4 g/dL (ref 30.0–36.0)
MCV: 90.2 fL (ref 80.0–100.0)
Platelets: 323 10*3/uL (ref 150–400)
RBC: 4.41 MIL/uL (ref 3.87–5.11)
RDW: 13.7 % (ref 11.5–15.5)
WBC: 4.3 10*3/uL (ref 4.0–10.5)
nRBC: 0 % (ref 0.0–0.2)

## 2020-07-10 LAB — BASIC METABOLIC PANEL
Anion gap: 9 (ref 5–15)
BUN: 5 mg/dL — ABNORMAL LOW (ref 6–20)
CO2: 22 mmol/L (ref 22–32)
Calcium: 8.8 mg/dL — ABNORMAL LOW (ref 8.9–10.3)
Chloride: 108 mmol/L (ref 98–111)
Creatinine, Ser: 0.84 mg/dL (ref 0.44–1.00)
GFR, Estimated: >60 mL/min (ref >60)
Glucose, Bld: 127 mg/dL — ABNORMAL HIGH (ref 70–99)
Potassium: 3.9 mmol/L (ref 3.5–5.1)
Sodium: 139 mmol/L (ref 135–145)

## 2020-07-10 LAB — I-STAT BETA HCG BLOOD, ED (MC, WL, AP ONLY): I-stat hCG, quantitative: <5 m[IU]/mL (ref <5)

## 2020-07-10 LAB — TROPONIN I (HIGH SENSITIVITY): Troponin I (High Sensitivity): 15 ng/L (ref <18)

## 2020-07-10 MED ORDER — MORPHINE SULFATE (PF) 4 MG/ML IV SOLN
4.0000 mg | Freq: Once | INTRAVENOUS | Status: DC
Start: 2020-07-10 — End: 2020-07-11
  Filled 2020-07-10: qty 1

## 2020-07-10 MED ORDER — LABETALOL HCL 100 MG PO TABS
100.0000 mg | ORAL_TABLET | Freq: Once | ORAL | Status: AC
  Administered 2020-07-10: 100 mg via ORAL
  Filled 2020-07-10: qty 1

## 2020-07-10 MED ORDER — IOHEXOL 350 MG/ML SOLN
100.0000 mL | Freq: Once | INTRAVENOUS | Status: AC | PRN
  Administered 2020-07-10: 100 mL via INTRAVENOUS

## 2020-07-10 NOTE — ED Provider Notes (Signed)
[place] EMERGENCY DEPARTMENT Provider Note   CSN: [id number] Arrival date & time: 07/10/20  1830     History Chief Complaint  Patient presents with  . Shortness of Breath    Michelle Houston is a 40 y.o. female.  Patient is a 40 year old female with a history of diabetes, hypertension who recently delivered a baby 3 weeks ago who is presenting today with complaints of shortness of breath.  Patient reports that for 3 to 4 days after delivery she noticed she had pain and swelling of her right leg.  Shortly after the leg pain and swelling went away but she then started noticing shortness of breath.  The shortness of breath is mostly present with lying down.  It is not so much with walking and she has no further swelling in her lower extremities.  However her blood pressure has been elevated despite taking labetalol twice daily and her inhalers do not seem to be working.  She has not had any cough, fever or congestion.  The history is provided by the patient.  Shortness of Breath      Past Medical History:  Diagnosis Date  . Bronchitis   . Diabetes mellitus without complication ([place])    "being watched for it"  . Hypertension   . Obesity   . PCOS (polycystic ovarian syndrome)   . Pneumonia     Patient Active Problem List   Diagnosis Date Noted  . Diabetes mellitus affecting pregnancy 06/17/2020  . BMI 45.0-49.9, adult ([place]) 05/17/2020  . Maternal obesity affecting pregnancy, antepartum 12/14/2019  . Encounter for supervision of high risk multigravida of advanced maternal age, antepartum 12/07/2019  . Chronic hypertension during pregnancy, antepartum 11/14/2016  . Diabetes mellitus affecting pregnancy, antepartum 10/17/2016  . AMA (advanced maternal age) multigravida 35+ 09/30/2016  . DM (diabetes mellitus) ([place]) 09/19/2016  . History of bladder repair surgery 09/19/2016  . H/O carpal tunnel repair 09/19/2016  . Obese 09/19/2016  . Chronic hypertension  10/13/2013    Past Surgical History:  Procedure Laterality Date  . BLADDER REPAIR    . CARPAL TUNNEL RELEASE    . CHOLECYSTECTOMY       OB History    Gravida  6   Para  5   Term  5   Preterm      AB  1   Living  5     SAB  1   IAB      Ectopic      Multiple  0   Live Births  5           Family History  Problem Relation Age of Onset  . Cancer Maternal Grandmother   . Hypertension Mother   . Fibroids Mother   . Diabetes Paternal Aunt   . Stroke Maternal Grandfather   . Diabetes Paternal Grandmother   . Stroke Paternal Grandmother   . Cancer Father        lung  . Heart disease Paternal Grandfather     Social History   Tobacco Use  . Smoking status: Current Every Day Smoker    Packs/day: 0.25    Years: 18.00    Pack years: 4.50    Types: Cigarettes  . Smokeless tobacco: Never Used  . Tobacco comment: 10 a day  Vaping Use  . Vaping Use: Never used  Substance Use Topics  . Alcohol use: No  . Drug use: No    Home Medications Prior to Admission medications  Medication Sig Start Date End Date Taking? Authorizing Provider  Accu-Chek Softclix Lancets lancets Use as instructed 01/11/20   Chancy Milroy, MD  aspirin EC 81 MG tablet Take 1 tablet (81 mg total) by mouth daily. Take after 12 weeks for prevention of preeclampsia later in pregnancy 12/14/19   Constant, Peggy, MD  Blood Glucose Monitoring Suppl (ACCU-CHEK GUIDE) w/Device KIT 1 Device by Does not apply route 4 (four) times daily. 01/11/20   Chancy Milroy, MD  cyclobenzaprine (FLEXERIL) 10 MG tablet Take 1 tablet (10 mg total) by mouth every 8 (eight) hours as needed for muscle spasms. 05/26/20   Gaylan Gerold R, CNM  glucose blood (ACCU-CHEK GUIDE) test strip Use to check blood sugars four times a day was instructed 01/11/20   Chancy Milroy, MD  ibuprofen (ADVIL) 600 MG tablet Take 1 tablet (600 mg total) by mouth every 6 (six) hours. 06/19/20   Wende Mott, CNM   ipratropium-albuterol (DUONEB) 0.5-2.5 (3) MG/3ML SOLN Take 3 mLs by nebulization every 4 (four) hours as needed. 03/06/20   Wieters, Hallie C, PA-C  labetalol (NORMODYNE) 100 MG tablet Take 1 tablet (100 mg total) by mouth 2 (two) times daily. 05/24/20   Lajean Manes, CNM  metFORMIN (GLUCOPHAGE) 500 MG tablet Take 2 tablets (1,000 mg total) by mouth 2 (two) times daily with a meal. 06/19/20 09/17/20  Wende Mott, CNM  Prenatal Vit-Fe Fumarate-FA (PRENATAL MULTIVITAMIN) TABS tablet Take 1 tablet by mouth daily at 12 noon.    [provider]  albuterol (PROVENTIL HFA;VENTOLIN HFA) 108 (90 Base) MCG/ACT inhaler Inhale 2 puffs into the lungs every 6 (six) hours as needed for wheezing or shortness of breath. Patient not taking: Reported on 01/01/2019 06/25/18 05/05/19  Suella Broad A, PA-C  fluticasone [facility]) 50 MCG/ACT nasal spray Place 1 spray into both nostrils daily. Patient not taking: Reported on 01/01/2019 06/25/18 05/05/19  Tacy Learn, PA-C    Allergies    Patient has no known allergies.  Review of Systems   Review of Systems  Respiratory: Positive for shortness of breath.   All other systems reviewed and are negative.   Physical Exam Updated Vital Signs BP (!) 173/93 (BP Location: Left Arm)   Pulse 83   Temp 98.1 F (36.7 C) (Oral)   Resp 18   Ht 5' 1.5" (1.562 m)   LMP 10/02/2019   SpO2 100%   BMI 44.28 kg/m   Physical Exam Vitals and nursing note reviewed.  Constitutional:      General: She is not in acute distress.    Appearance: Normal appearance. She is well-developed and well-nourished.  HENT:     Head: Normocephalic and atraumatic.  Eyes:     Extraocular Movements: EOM normal.     Pupils: Pupils are equal, round, and reactive to light.  Cardiovascular:     Rate and Rhythm: Normal rate and regular rhythm.     Pulses: Intact distal pulses.     Heart sounds: Normal heart sounds. No murmur heard. No friction rub.  Pulmonary:     Effort:  Pulmonary effort is normal. No tachypnea.     Breath sounds: Normal breath sounds. No decreased breath sounds, wheezing or rales.  Abdominal:     General: Bowel sounds are normal. There is no distension.     Palpations: Abdomen is soft.     Tenderness: There is no abdominal tenderness. There is no guarding or rebound.  Musculoskeletal:  General: No tenderness. Normal range of motion.     Right lower leg: No edema.     Left lower leg: No edema.     Comments: No edema  Skin:    General: Skin is warm and dry.     Findings: No rash.  Neurological:     General: No focal deficit present.     Mental Status: She is alert and oriented to person, place, and time. Mental status is at baseline.     Cranial Nerves: No cranial nerve deficit.  Psychiatric:        Mood and Affect: Mood and affect and mood normal.        Behavior: Behavior normal.        Thought Content: Thought content normal.     ED Results / Procedures / Treatments   Labs (all labs ordered are listed, but only abnormal results are displayed) Labs Reviewed - No data to display  EKG None  Radiology DG Chest 2 View  Result Date: 07/10/2020 CLINICAL DATA:  Chest pain, hypertension, smoker EXAM: CHEST - 2 VIEW COMPARISON:  10/29/2019 FINDINGS: Upper normal size of cardiac silhouette. Mediastinal contours and pulmonary vascularity normal. Lungs clear. No pulmonary infiltrate, pleural effusion, or pneumothorax. Osseous structures unremarkable. IMPRESSION: No acute abnormalities. Electronically Signed   By: Lavonia Dana M.D.   On: 07/10/2020 16:45    Procedures Procedures   Medications Ordered in ED Medications  labetalol (NORMODYNE) tablet 100 mg (has no administration in time range)  morphine 4 MG/ML injection 4 mg (has no administration in time range)    ED Course  I have reviewed the triage vital signs and the nursing notes.  Pertinent labs & imaging results that were available during my care of the patient were  reviewed by me and considered in my medical decision making (see chart for details).    MDM Rules/Calculators/A&P                          Patient is a 40 year old female presenting today with complaints of shortness of breath.  Her shortness of breath seems to be the worst with lying down.  She does not have significant dyspnea with exertion.  She did initially report that she had right leg pain and swelling prior to the shortness of breath starting after delivery but now is just had the shortness of breath.  She has no edema in her lower extremities, no rales or evidence of fluid overload at this time concerning for postpartum cardiomyopathy.  Patient did have hypertension throughout her pregnancy and is hypertensive here today at 173/93.  She has not had her second dose of labetalol which she would normally take around this time.  She was given the oral medication and will continue to follow.  Patient had labs done at [facility] today which showed a normal CBC and BMP.  Her chest x-ray was within normal limits.  However given her history of leg pain, shortness of breath and recent delivery at advanced maternal age concern for PE.  Also concern for possible preeclampsia however she denies any headaches or other symptoms except for shortness of breath when she lies down.  It could be reflux as she reports she feels a sensation in her throat and again it is present mostly when she lies down.  However we will do the CTA for evaluation.  Will recheck blood pressure.  8:39 PM EKG wnl.  CTA neg for acute findings.  After labetalol BP is 155/93.  Low suspicion for preeclampsia at this time.  Feel that patient symptoms may be related to GERD as she did stop her Pepcid.  Patient has been taking 100 mg of labetalol twice daily.  Recommended she increase it to 200 mg in the morning and 100 mg at night she has a cuff at home and we will continue to follow her blood pressure and write down the readings.  She will plan to  follow-up with Dr. Roselie Awkward at [facility] and start back on her Pepcid.  MDM Number of Diagnoses or Management Options   Amount and/or Complexity of Data Reviewed Clinical lab tests: reviewed Tests in the radiology section of CPT: ordered and reviewed Tests in the medicine section of CPT: ordered and reviewed Independent visualization of images, tracings, or specimens: yes  Risk of Complications, Morbidity, and/or Mortality Presenting problems: moderate Diagnostic procedures: low Management options: minimal  Patient Progress Patient progress: stable   Final Clinical Impression(s) / ED Diagnoses Final diagnoses:  Hypertension, unspecified type  Gastroesophageal reflux disease with esophagitis without hemorrhage    Rx / DC Orders ED Discharge Orders    None       Blanchie Dessert, MD 07/10/20 2042

## 2020-07-10 NOTE — ED Triage Notes (Signed)
Pt reports vaginal delivery on 1/28.  States she has had pain to R leg and R foot swelling.  C/o SOB and pain to center of chest that is worse at night when lying.  Denies pain at present.

## 2020-07-10 NOTE — ED Triage Notes (Signed)
Seen at [facility] for the same.  Blood work drawn, Publishing copy, and xray completed.  Left due to wait.  Reports having SOB when she lays down since having her baby on 06/10/20.

## 2020-07-10 NOTE — Discharge Instructions (Signed)
CAT scan today is normal.  EKG and blood work also looked good.  Start taking 2 labetalol (200mg ) in the morning and 1 at night.  Keep a record of your blood pressure and take it with you to your appointment.  Also start back on your pepcid for 2 weeks or over the counter prilosec

## 2020-07-10 NOTE — ED Notes (Signed)
Pt stated that she was going to an urgent care and no longer waiting.

## 2020-07-29 ENCOUNTER — Ambulatory Visit: Payer: Medicaid Other | Admitting: Obstetrics & Gynecology

## 2020-08-10 ENCOUNTER — Telehealth: Payer: Self-pay | Admitting: Obstetrics & Gynecology

## 2020-09-05 ENCOUNTER — Ambulatory Visit: Payer: Medicaid Other | Admitting: Obstetrics and Gynecology

## 2020-09-07 ENCOUNTER — Other Ambulatory Visit: Payer: Self-pay | Admitting: Internal Medicine

## 2020-09-08 LAB — CBC
HCT: 41.8 % (ref 35.0–45.0)
Hemoglobin: 13.4 g/dL (ref 11.7–15.5)
MCH: 28 pg (ref 27.0–33.0)
MCHC: 32.1 g/dL (ref 32.0–36.0)
MCV: 87.4 fL (ref 80.0–100.0)
MPV: 10.1 fL (ref 7.5–12.5)
Platelets: 361 10*3/uL (ref 140–400)
RBC: 4.78 10*6/uL (ref 3.80–5.10)
RDW: 13.6 % (ref 11.0–15.0)
WBC: 4.2 10*3/uL (ref 3.8–10.8)

## 2020-09-08 LAB — COMPLETE METABOLIC PANEL WITH GFR
AG Ratio: 1.7 (calc) (ref 1.0–2.5)
ALT: 35 U/L — ABNORMAL HIGH (ref 6–29)
AST: 32 U/L — ABNORMAL HIGH (ref 10–30)
Albumin: 4.7 g/dL (ref 3.6–5.1)
Alkaline phosphatase (APISO): 103 U/L (ref 31–125)
BUN: 17 mg/dL (ref 7–25)
CO2: 22 mmol/L (ref 20–32)
Calcium: 9.8 mg/dL (ref 8.6–10.2)
Chloride: 98 mmol/L (ref 98–110)
Creat: 0.93 mg/dL (ref 0.50–1.10)
GFR, Est African American: 89 mL/min/{1.73_m2} (ref >=60)
GFR, Est Non African American: 77 mL/min/{1.73_m2} (ref >=60)
Globulin: 2.8 g/dL (calc) (ref 1.9–3.7)
Glucose, Bld: 146 mg/dL — ABNORMAL HIGH (ref 65–99)
Potassium: 4.3 mmol/L (ref 3.5–5.3)
Sodium: 133 mmol/L — ABNORMAL LOW (ref 135–146)
Total Bilirubin: 0.5 mg/dL (ref 0.2–1.2)
Total Protein: 7.5 g/dL (ref 6.1–8.1)

## 2020-09-08 LAB — LIPID PANEL
Cholesterol: 204 mg/dL — ABNORMAL HIGH (ref <200)
HDL: 52 mg/dL (ref >=50)
LDL Cholesterol (Calc): 130 mg/dL (calc) — ABNORMAL HIGH
Non-HDL Cholesterol (Calc): 152 mg/dL (calc) — ABNORMAL HIGH (ref <130)
Total CHOL/HDL Ratio: 3.9 (calc) (ref <5.0)
Triglycerides: 112 mg/dL (ref <150)

## 2020-09-08 LAB — TSH: TSH: 0.74 mIU/L

## 2020-09-08 LAB — VITAMIN D 25 HYDROXY (VIT D DEFICIENCY, FRACTURES): Vit D, 25-Hydroxy: 23 ng/mL — ABNORMAL LOW (ref 30–100)

## 2020-09-22 ENCOUNTER — Ambulatory Visit: Payer: Medicaid Other | Admitting: Obstetrics

## 2020-09-22 ENCOUNTER — Other Ambulatory Visit: Payer: Self-pay

## 2020-09-22 DIAGNOSIS — E119 Type 2 diabetes mellitus without complications: Secondary | ICD-10-CM

## 2020-09-23 ENCOUNTER — Ambulatory Visit: Payer: Medicaid Other | Admitting: Obstetrics

## 2020-10-04 ENCOUNTER — Telehealth: Payer: Self-pay

## 2020-10-04 NOTE — Telephone Encounter (Signed)
Patient left message wanting to reschedule shoulder surgery that was planned for last July but she couldn't have it due to pregnancy.  Do you want to see her or proceed with scheduling?

## 2020-10-05 NOTE — Telephone Encounter (Signed)
I have to see her first since it has been a year since I saw her last.

## 2020-10-06 NOTE — Telephone Encounter (Signed)
Please call patient to schedule appointment. Thanks.

## 2020-10-18 ENCOUNTER — Ambulatory Visit: Payer: Medicaid Other | Admitting: Orthopaedic Surgery

## 2020-10-26 ENCOUNTER — Ambulatory Visit (INDEPENDENT_AMBULATORY_CARE_PROVIDER_SITE_OTHER): Payer: Medicaid Other | Admitting: Orthopaedic Surgery

## 2020-10-26 ENCOUNTER — Encounter: Payer: Self-pay | Admitting: Orthopaedic Surgery

## 2020-10-26 DIAGNOSIS — M25511 Pain in right shoulder: Secondary | ICD-10-CM | POA: Diagnosis not present

## 2020-10-26 DIAGNOSIS — G8929 Other chronic pain: Secondary | ICD-10-CM

## 2020-10-26 DIAGNOSIS — M75121 Complete rotator cuff tear or rupture of right shoulder, not specified as traumatic: Secondary | ICD-10-CM | POA: Diagnosis not present

## 2020-10-26 MED ORDER — MELOXICAM 15 MG PO TABS: 15.0000 mg | ORAL_TABLET | Freq: Every day | ORAL | 1 refills | Status: DC | PRN

## 2020-10-26 NOTE — Progress Notes (Signed)
The patient is well-known to me.  She has a full thickness rotator cuff tear of the right shoulder that was minimally retracted.  This occurred and we had set her up for surgery but then she found out she was pregnant.  Her child is now 4 months ago.  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  She has been able to tolerate weakness in that right shoulder and has been able to keep it moving.  She reports only some mild pain.  She has had no other acute change in her medical status.  She is diabetic but reports now really good control.  She currently denies any headache, chest pain, shortness of breath, fever, chills, nausea, vomiting I was able to review her medications and notes in epic.  Examination of her right shoulder shows good range of motion of that shoulder.  There is definitely weakness in the rotator cuff with abduction and slightly with external rotation.  Her liftoff is negative and she has good motion with reaching behind her.  Previous MRI of the right shoulder does show a distal tear of the supraspinatus tendon that is full-thickness of the right shoulder.  We will see her back in September to see about scheduling her for right shoulder arthroscopy with a possible rotator cuff repair.  She will avoid heavy lifting and overactivity until then I will send in some meloxicam.  All questions and concerns were answered and addressed.

## 2021-01-14 ENCOUNTER — Other Ambulatory Visit: Payer: Self-pay | Admitting: Orthopaedic Surgery

## 2021-01-16 NOTE — Telephone Encounter (Signed)
ok 

## 2021-01-25 ENCOUNTER — Ambulatory Visit: Payer: Medicaid Other | Admitting: Physician Assistant

## 2021-02-12 IMAGING — US US OB < 14 WEEKS - US OB TV
1 series · 15 of 28 positions shown · non-contrast
Comparison: None for this gestation

CLINICAL DATA: Abdominal cramping and vaginal bleeding in first
trimester of pregnancy; LMP = 10/02/2019; quantitative beta HCG =
10,221

EXAM:
OBSTETRIC <14 WK US AND TRANSVAGINAL OB US
TECHNIQUE: Both transabdominal and transvaginal ultrasound examinations were
performed for complete evaluation of the gestation as well as the
maternal uterus, adnexal regions, and pelvic cul-de-sac.
Transvaginal technique was performed to assess early pregnancy.

[Series 1: us ob < 14 weeks - us ob tv · 46 acquisitions, 15 frames shown]
[im 1/46]
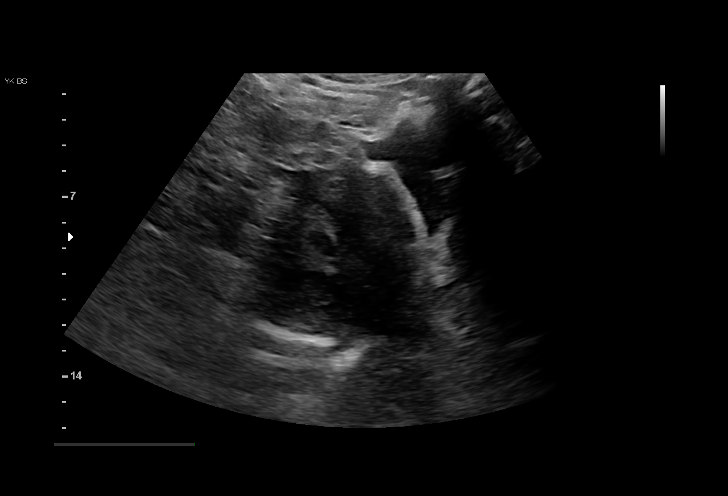
[im 4/46]
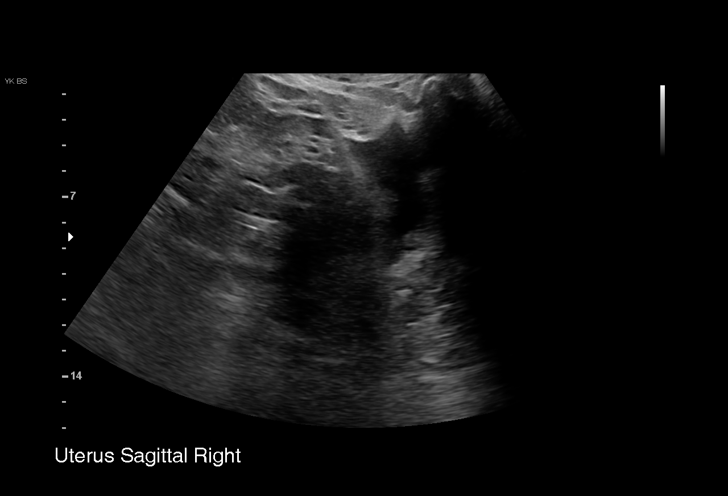
[im 7/46]
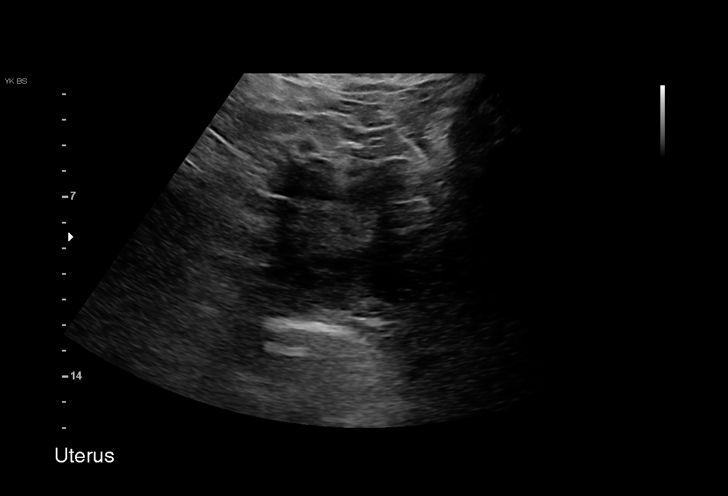
[im 11/46]
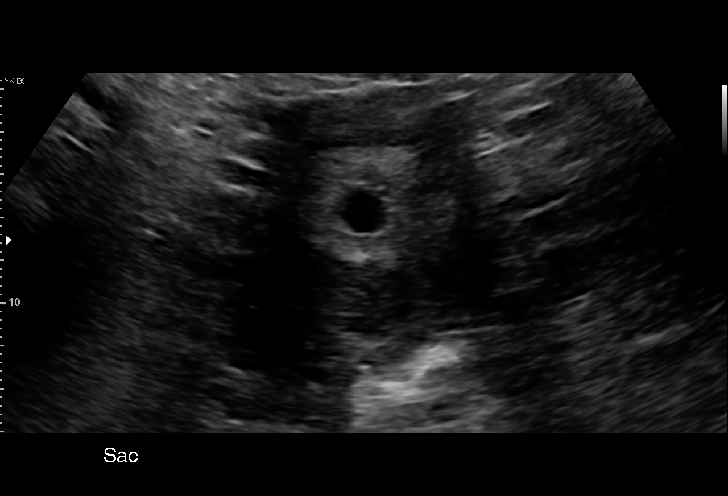
[im 14/46]
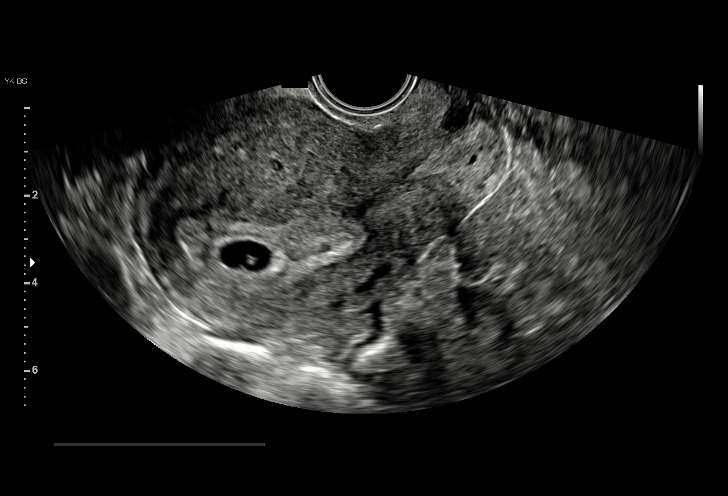
[im 17/46]
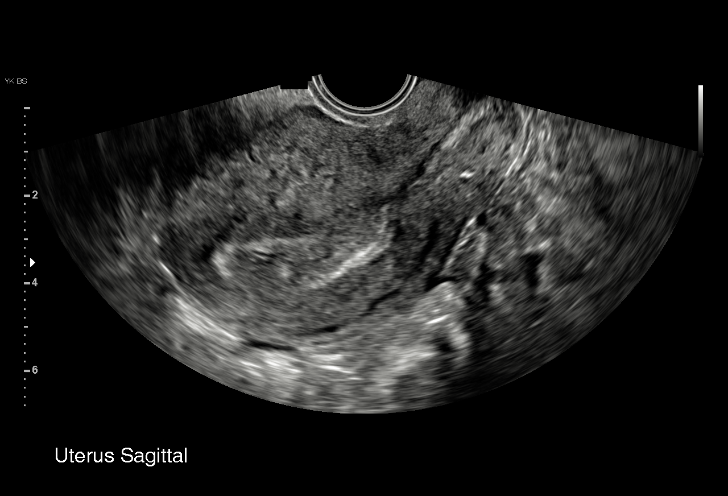
[im 21/46]
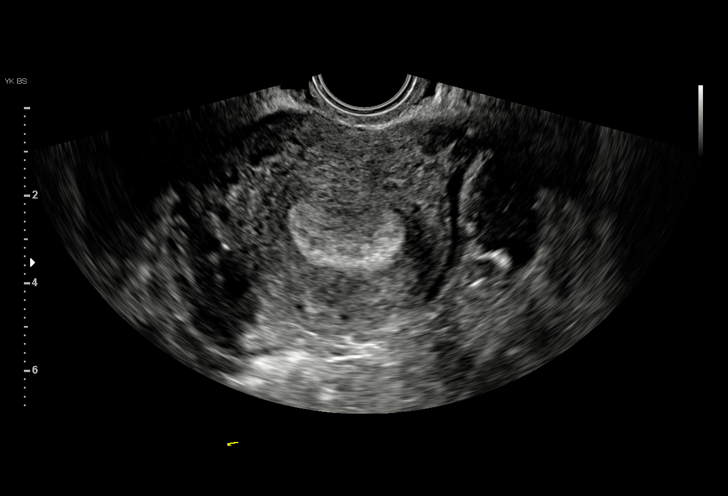
[im 24/46]
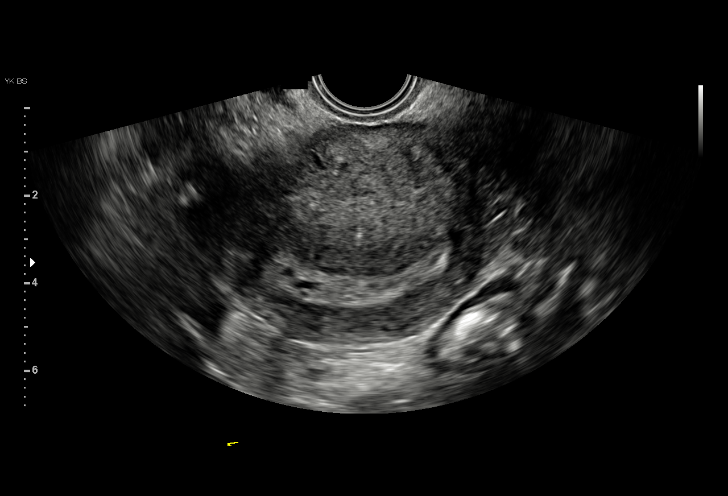
[im 26/46]
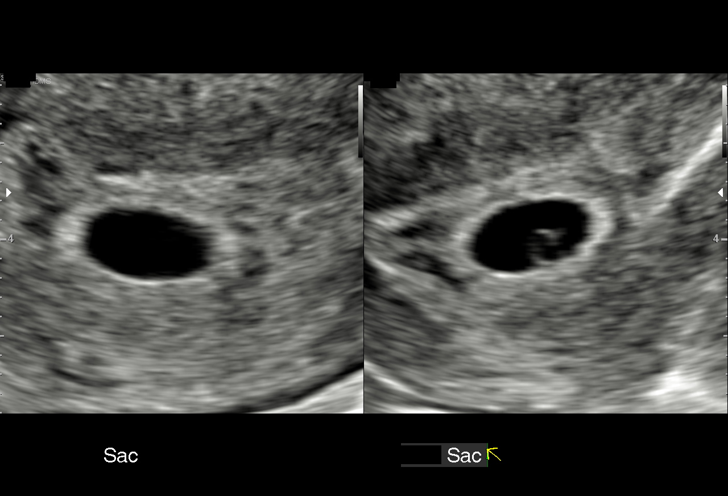
[im 29/46]
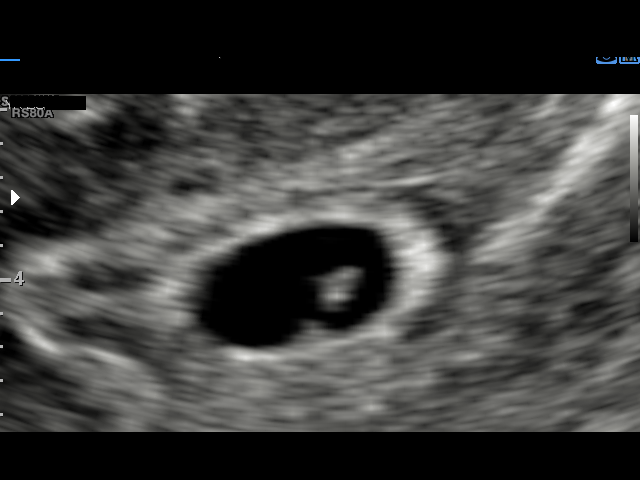
[im 32/46]
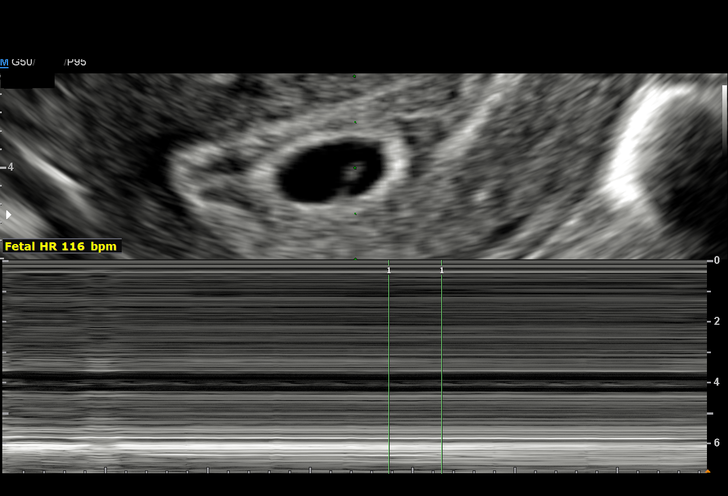
[im 36/46]
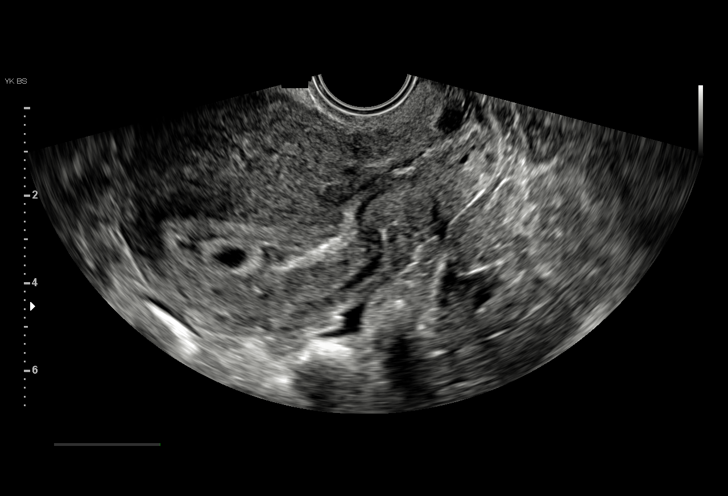
[im 39/46]
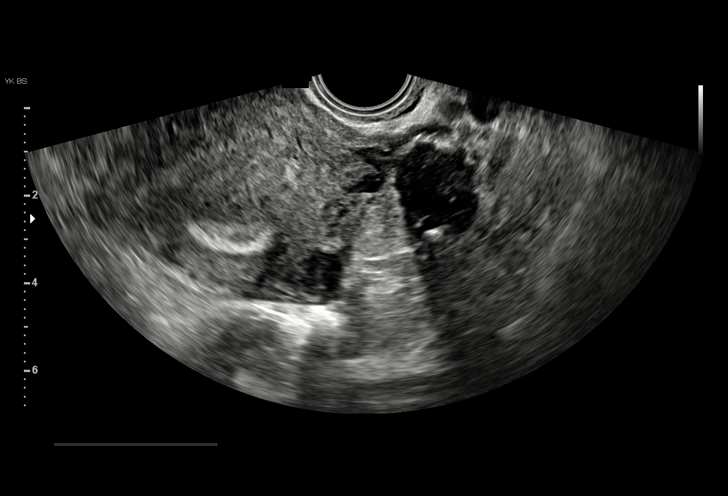
[im 42/46]
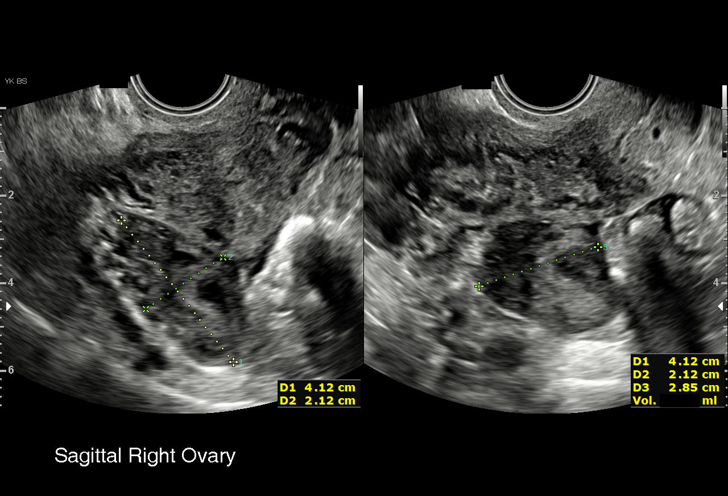
[im 46/46]
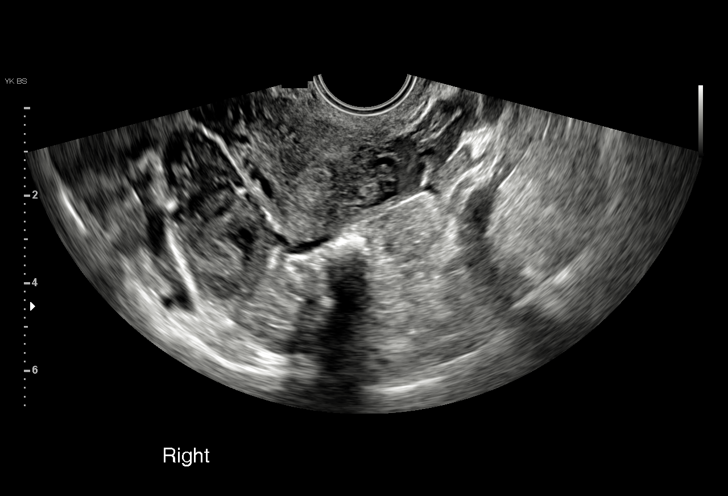

[15 of 28 positions shown; findings below may reference images not displayed]

FINDINGS: Intrauterine gestational sac: Present, single

Yolk sac:  Present

Embryo:  Present

Cardiac Activity: Present

Heart Rate: 116 bpm

CRL:  2.9 mm   5 w   5 d                  US EDC: 07/12/2020

Subchorionic hemorrhage:  No definite subchronic hemorrhage.

Maternal uterus/adnexae:

Uterus anteverted with tiny exophytic leiomyoma at posterior uterus
13 x 11 x 16 mm.

RIGHT ovary normal size and morphology 4.1 x 2.1 x 2.9 cm,
containing small corpus luteum.

LEFT ovary normal size and morphology 2.7 x 1.8 x 2.0 cm.

Trace free pelvic fluid.

No adnexal masses.
IMPRESSION: Single live intrauterine gestation at 5 weeks 5 days EGA by
crown-rump length.

No acute abnormalities.

Tiny exophytic leiomyoma from posterior uterus 16 mm greatest size.

## 2021-03-13 ENCOUNTER — Telehealth: Payer: Self-pay | Admitting: Orthopaedic Surgery

## 2021-03-13 NOTE — Telephone Encounter (Signed)
LMOM for patient of the below message  

## 2021-03-13 NOTE — Telephone Encounter (Signed)
Pt called requesting a cream for pain in shoulder. Pt states meloxicam is not working. Please call pt about this matter at [phone].

## 2021-03-20 ENCOUNTER — Other Ambulatory Visit: Payer: Self-pay

## 2021-03-20 ENCOUNTER — Ambulatory Visit (INDEPENDENT_AMBULATORY_CARE_PROVIDER_SITE_OTHER): Payer: Medicaid Other | Admitting: Orthopaedic Surgery

## 2021-03-20 ENCOUNTER — Encounter: Payer: Self-pay | Admitting: Orthopaedic Surgery

## 2021-03-20 DIAGNOSIS — M25512 Pain in left shoulder: Secondary | ICD-10-CM | POA: Diagnosis not present

## 2021-03-20 DIAGNOSIS — G8929 Other chronic pain: Secondary | ICD-10-CM

## 2021-03-20 DIAGNOSIS — M75121 Complete rotator cuff tear or rupture of right shoulder, not specified as traumatic: Secondary | ICD-10-CM

## 2021-03-20 DIAGNOSIS — M25511 Pain in right shoulder: Secondary | ICD-10-CM | POA: Diagnosis not present

## 2021-03-20 MED ORDER — CELECOXIB 200 MG PO CAPS: 200.0000 mg | ORAL_CAPSULE | Freq: Three times a day (TID) | ORAL | 3 refills | Status: DC | PRN

## 2021-03-20 NOTE — Progress Notes (Signed)
The patient is a 40 year old female well-known to me.  She has a known full-thickness tear of her right shoulder rotator cuff.  She has had to delay surgery several times for the shoulder.  The first time was due to a pregnancy.  Now she has a new job and she is waiting for insurance to kick in and make sure that she holds onto her job for a while.  Originally she was talked about having surgery this fall but now she is going to wait until February.  She has not tried Voltaren gel for her shoulder since the package inserts has not used on her shoulders.  I gave her reassurance that she should try this for her shoulders.  She has been on meloxicam and Biofreeze.  She says injections have not helped.  This was with steroids.  She is a diabetic but hemoglobin A1c is below 7.  On exam both shoulders move smoothly and fluidly but they are painful throughout the arc of motion.  There are some weakness of the right shoulder rotator cuff.  At this point we will try Celebrex as an anti-inflammatory and I will send this to her pharmacy.  She can still try Voltaren gel as well per my recommendation.  We can see her back in 3 months to see then if she is ready to schedule right shoulder arthroscopy.  All questions and concerns were answered and addressed.  She will still avoid heavy lifting with her shoulders.

## 2021-03-23 ENCOUNTER — Other Ambulatory Visit: Payer: Self-pay | Admitting: Orthopaedic Surgery

## 2021-05-24 ENCOUNTER — Other Ambulatory Visit: Payer: Self-pay

## 2021-05-24 ENCOUNTER — Ambulatory Visit (INDEPENDENT_AMBULATORY_CARE_PROVIDER_SITE_OTHER): Payer: Medicaid Other | Admitting: Orthopaedic Surgery

## 2021-05-24 ENCOUNTER — Ambulatory Visit: Payer: Medicaid Other | Admitting: Orthopaedic Surgery

## 2021-05-24 ENCOUNTER — Encounter: Payer: Self-pay | Admitting: Orthopaedic Surgery

## 2021-05-24 DIAGNOSIS — G8929 Other chronic pain: Secondary | ICD-10-CM

## 2021-05-24 DIAGNOSIS — M75121 Complete rotator cuff tear or rupture of right shoulder, not specified as traumatic: Secondary | ICD-10-CM | POA: Diagnosis not present

## 2021-05-24 DIAGNOSIS — M25511 Pain in right shoulder: Secondary | ICD-10-CM | POA: Diagnosis not present

## 2021-05-24 NOTE — Progress Notes (Signed)
The patient is a 41 year old well-known to me.  She has a known full-thickness rotator cuff tear of her right shoulder.  She is ready to schedule arthroscopic surgery for that shoulder.  She has been between jobs before and got a new job and needed to delay surgery.  She still has right shoulder pain and weakness and antibiotic has confirmed in the past a full-thickness rotator cuff tear of the right shoulder.  She is a daily smoker and is a diabetic.  Diabetic control is excellent.  Examination right shoulder continues to show significant weakness and pain.  Again the MRI confirms a full-thickness rotator cuff tear.  I talked her in length in detail about arthroscopic surgery for the right shoulder and hopefully rotator cuff repair.  I talked about what to expect from an intraoperative and postoperative course as well as described the risks and benefits of surgery in detail.  All questions and concerns were answered and addressed.  I agree with getting the schedule of the standpoint given the MRI findings, clinical exam findings and the failure of conservative treatment for well over 6 months or more.  We will then see her back in 1 week postoperative.

## 2021-06-20 ENCOUNTER — Other Ambulatory Visit: Payer: Self-pay

## 2021-06-20 ENCOUNTER — Ambulatory Visit (HOSPITAL_COMMUNITY)
Admission: EM | Admit: 2021-06-20 | Discharge: 2021-06-20 | Disposition: A | Discharge status: Home / Self Care | Payer: Medicaid Other | Attending: Family Medicine | Admitting: Family Medicine

## 2021-06-20 ENCOUNTER — Encounter (HOSPITAL_COMMUNITY): Payer: Self-pay | Admitting: Emergency Medicine

## 2021-06-20 DIAGNOSIS — J4521 Mild intermittent asthma with (acute) exacerbation: Secondary | ICD-10-CM | POA: Insufficient documentation

## 2021-06-20 DIAGNOSIS — E669 Obesity, unspecified: Secondary | ICD-10-CM | POA: Insufficient documentation

## 2021-06-20 DIAGNOSIS — Z20822 Contact with and (suspected) exposure to covid-19: Secondary | ICD-10-CM | POA: Insufficient documentation

## 2021-06-20 DIAGNOSIS — Z6841 Body Mass Index (BMI) 40.0 and over, adult: Secondary | ICD-10-CM | POA: Insufficient documentation

## 2021-06-20 DIAGNOSIS — J069 Acute upper respiratory infection, unspecified: Secondary | ICD-10-CM | POA: Insufficient documentation

## 2021-06-20 MED ORDER — PREDNISONE 20 MG PO TABS: 40.0000 mg | ORAL_TABLET | Freq: Every day | ORAL | 0 refills | Status: AC

## 2021-06-20 MED ORDER — CHERATUSSIN AC 100-10 MG/5ML PO SOLN: 5.0000 mL | Freq: Four times a day (QID) | ORAL | 0 refills | Status: DC | PRN

## 2021-06-20 NOTE — ED Notes (Signed)
No answer from lobby  

## 2021-06-20 NOTE — ED Triage Notes (Signed)
Pt reports dry persistent cough since Saturday.

## 2021-06-20 NOTE — Discharge Instructions (Signed)
°  You have been swabbed for COVID, and the test will result in the next 24 hours. Our staff will call you if positive. If the test is positive, you should quarantine for 5 days.   Prednisone 20 mg 2 tabs daily for 5 days.  This is for inflammation in your lungs  Robitussin with codeine, 1 teaspoon or 5 mL 4 times daily as needed for cough.  This medicine could make you sleepy.

## 2021-06-20 NOTE — ED Provider Notes (Addendum)
[facility]    CSN: [id number] Arrival date & time: 06/20/21  1626      History   Chief Complaint Chief Complaint  Patient presents with   Cough    HPI Michelle Houston is a 41 y.o. female.    Cough Here with a history of dry cough and mild nasal congestion since the evening of January 28.  No fever or chills.  No vomiting or diarrhea.  She has begun wheezing, and has been using her albuterol nebulizer.  She does work in the [employer].  She has not gotten to do a home COVID test yet.  Past Medical History:  Diagnosis Date   Bronchitis    Diabetes mellitus without complication ([place])    "being watched for it"   Hypertension    Obesity    PCOS (polycystic ovarian syndrome)    Pneumonia     Patient Active Problem List   Diagnosis Date Noted   Diabetes mellitus affecting pregnancy 06/17/2020   BMI 45.0-49.9, adult ([place]) 05/17/2020   Maternal obesity affecting pregnancy, antepartum 12/14/2019   Chronic hypertension during pregnancy, antepartum 11/14/2016   Diabetes mellitus affecting pregnancy, antepartum 10/17/2016   AMA (advanced maternal age) multigravida 35+ 09/30/2016   DM (diabetes mellitus) ([place]) 09/19/2016   History of bladder repair surgery 09/19/2016   H/O carpal tunnel repair 09/19/2016   Obese 09/19/2016   Chronic hypertension 10/13/2013    Past Surgical History:  Procedure Laterality Date   BLADDER REPAIR     CARPAL TUNNEL RELEASE     CHOLECYSTECTOMY      OB History     Gravida  6   Para  5   Term  5   Preterm      AB  1   Living  5      SAB  1   IAB      Ectopic      Multiple  0   Live Births  5            Home Medications    Prior to Admission medications   Medication Sig Start Date End Date Taking? Authorizing Provider  guaiFENesin-codeine (CHERATUSSIN AC) 100-10 MG/5ML syrup Take 5 mLs by mouth 4 (four) times daily as needed for cough. 06/20/21  Yes Barrett Henle, MD  predniSONE (DELTASONE)  20 MG tablet Take 2 tablets (40 mg total) by mouth daily with breakfast for 5 days. 06/20/21 06/25/21 Yes Barrett Henle, MD  Accu-Chek Softclix Lancets lancets Use as instructed 01/11/20   Chancy Milroy, MD  aspirin EC 81 MG tablet Take 1 tablet (81 mg total) by mouth daily. Take after 12 weeks for prevention of preeclampsia later in pregnancy 12/14/19   Constant, Peggy, MD  Blood Glucose Monitoring Suppl (ACCU-CHEK GUIDE) w/Device KIT 1 Device by Does not apply route 4 (four) times daily. 01/11/20   Chancy Milroy, MD  celecoxib (CELEBREX) 200 MG capsule Take 1 capsule (200 mg total) by mouth 3 (three) times daily with meals as needed. 03/20/21   Mcarthur Rossetti, MD  cyclobenzaprine (FLEXERIL) 10 MG tablet Take 1 tablet (10 mg total) by mouth every 8 (eight) hours as needed for muscle spasms. 05/26/20   Gabriel Carina, CNM  glucose blood (ACCU-CHEK GUIDE) test strip Use to check blood sugars four times a day was instructed 01/11/20   Chancy Milroy, MD  ibuprofen (ADVIL) 600 MG tablet Take 1 tablet (600 mg total) by mouth every 6 (  six) hours. 06/19/20   Wende Mott, CNM  ipratropium-albuterol (DUONEB) 0.5-2.5 (3) MG/3ML SOLN Take 3 mLs by nebulization every 4 (four) hours as needed. 03/06/20   Wieters, Hallie C, PA-C  labetalol (NORMODYNE) 100 MG tablet Take 1 tablet (100 mg total) by mouth 2 (two) times daily. 05/24/20   Lajean Manes, CNM  meloxicam (MOBIC) 15 MG tablet TAKE 1 TABLET BY MOUTH EVERY DAY AS NEEDED FOR PAIN 03/23/21   Mcarthur Rossetti, MD  metFORMIN (GLUCOPHAGE) 500 MG tablet Take 2 tablets (1,000 mg total) by mouth 2 (two) times daily with a meal. 06/19/20 09/17/20  Wende Mott, CNM  Prenatal Vit-Fe Fumarate-FA (PRENATAL MULTIVITAMIN) TABS tablet Take 1 tablet by mouth daily at 12 noon.    [provider]  albuterol (PROVENTIL HFA;VENTOLIN HFA) 108 (90 Base) MCG/ACT inhaler Inhale 2 puffs into the lungs every 6 (six) hours as needed for wheezing  or shortness of breath. Patient not taking: Reported on 01/01/2019 06/25/18 05/05/19  Suella Broad A, PA-C  fluticasone [facility]) 50 MCG/ACT nasal spray Place 1 spray into both nostrils daily. Patient not taking: Reported on 01/01/2019 06/25/18 05/05/19  Tacy Learn, PA-C    Family History Family History  Problem Relation Age of Onset   Cancer Maternal Grandmother    Hypertension Mother    Fibroids Mother    Diabetes Paternal Aunt    Stroke Maternal Grandfather    Diabetes Paternal Grandmother    Stroke Paternal Grandmother    Cancer Father        lung   Heart disease Paternal Grandfather     Social History Social History   Tobacco Use   Smoking status: Every Day    Packs/day: 0.25    Years: 18.00    Pack years: 4.50    Types: Cigarettes   Smokeless tobacco: Never   Tobacco comments:    10 a day  Vaping Use   Vaping Use: Never used  Substance Use Topics   Alcohol use: No   Drug use: No     Allergies   Patient has no known allergies.   Review of Systems Review of Systems  Respiratory:  Positive for cough.     Physical Exam Triage Vital Signs ED Triage Vitals  Enc Vitals Group     BP 06/20/21 1810 (!) 135/99     Pulse Rate 06/20/21 1810 95     Resp 06/20/21 1810 19     Temp 06/20/21 1810 98.2 F (36.8 C)     Temp Source 06/20/21 1810 Oral     SpO2 06/20/21 1810 97 %     Weight --      Height --      Head Circumference --      Peak Flow --      Pain Score 06/20/21 1809 0     Pain Loc --      Pain Edu? --      Excl. in [place]? --    No data found.  Updated Vital Signs BP (!) 135/99 (BP Location: Left Arm)    Pulse 95    Temp 98.2 F (36.8 C) (Oral)    Resp 19    SpO2 97%   Visual Acuity Right Eye Distance:   Left Eye Distance:   Bilateral Distance:    Right Eye Near:   Left Eye Near:    Bilateral Near:     Physical Exam Vitals reviewed.  Constitutional:      General:  She is not in acute distress.    Appearance: She is not  toxic-appearing.  HENT:     Right Ear: Tympanic membrane and ear canal normal.     Left Ear: Tympanic membrane and ear canal normal.     Nose: Nose normal.     Mouth/Throat:     Mouth: Mucous membranes are moist.     Pharynx: No oropharyngeal exudate or posterior oropharyngeal erythema.  Eyes:     Extraocular Movements: Extraocular movements intact.     Conjunctiva/sclera: Conjunctivae normal.     Pupils: Pupils are equal, round, and reactive to light.  Cardiovascular:     Rate and Rhythm: Normal rate and regular rhythm.     Heart sounds: No murmur heard. Pulmonary:     Effort: Pulmonary effort is normal. No respiratory distress.     Breath sounds: No wheezing, rhonchi or rales.  Musculoskeletal:     Cervical back: Neck supple.  Lymphadenopathy:     Cervical: No cervical adenopathy.  Skin:    Capillary Refill: Capillary refill takes less than 2 seconds.     Coloration: Skin is not jaundiced or pale.  Neurological:     General: No focal deficit present.     Mental Status: She is alert and oriented to person, place, and time.  Psychiatric:        Behavior: Behavior normal.     UC Treatments / Results  Labs (all labs ordered are listed, but only abnormal results are displayed) Labs Reviewed  SARS CORONAVIRUS 2 (TAT 6-24 HRS)    EKG   Radiology No results found.  Procedures Procedures (including critical care time)  Medications Ordered in UC Medications - No data to display  Initial Impression / Assessment and Plan / UC Course  I have reviewed the triage vital signs and the nursing notes.  Pertinent labs & imaging results that were available during my care of the patient were reviewed by me and considered in my medical decision making (see chart for details).     I will treat the asthma exacerbation and give her some cough relief.  Renal function is normal in epic.Marland Kitchen  She has asthma and elevated BMI, and so is at risk for severe COVID infection.  If her COVID  test is positive, she should be prescribed paxlovi, normal dosing Final Clinical Impressions(s) / UC Diagnoses   Final diagnoses:  Mild intermittent asthma with exacerbation  Viral URI with cough     Discharge Instructions       You have been swabbed for COVID, and the test will result in the next 24 hours. Our staff will call you if positive. If the test is positive, you should quarantine for 5 days.   Prednisone 20 mg 2 tabs daily for 5 days.  This is for inflammation in your lungs  Robitussin with codeine, 1 teaspoon or 5 mL 4 times daily as needed for cough.  This medicine could make you sleepy.     ED Prescriptions     Medication Sig Dispense Auth. Provider   guaiFENesin-codeine (CHERATUSSIN AC) 100-10 MG/5ML syrup Take 5 mLs by mouth 4 (four) times daily as needed for cough. 120 mL Barrett Henle, MD   predniSONE (DELTASONE) 20 MG tablet Take 2 tablets (40 mg total) by mouth daily with breakfast for 5 days. 10 tablet Windy Carina Gwenlyn Perking, MD      PDMP not reviewed this encounter.   Barrett Henle, MD 06/20/21 Woodward Ku,  Gwenlyn Perking, MD 06/20/21 Einar Crow

## 2021-06-21 LAB — SARS CORONAVIRUS 2 (TAT 6-24 HRS): SARS Coronavirus 2: NEGATIVE

## 2021-06-26 ENCOUNTER — Ambulatory Visit: Payer: Medicaid Other | Admitting: Orthopaedic Surgery

## 2021-07-21 ENCOUNTER — Other Ambulatory Visit: Payer: Self-pay | Admitting: Internal Medicine

## 2021-07-22 LAB — COMPLETE METABOLIC PANEL WITH GFR
AG Ratio: 1.6 (calc) (ref 1.0–2.5)
ALT: 29 U/L (ref 6–29)
AST: 29 U/L (ref 10–30)
Albumin: 4.2 g/dL (ref 3.6–5.1)
Alkaline phosphatase (APISO): 80 U/L (ref 31–125)
BUN/Creatinine Ratio: 7 (calc) (ref 6–22)
BUN: 7 mg/dL (ref 7–25)
CO2: 22 mmol/L (ref 20–32)
Calcium: 9.2 mg/dL (ref 8.6–10.2)
Chloride: 104 mmol/L (ref 98–110)
Creat: 1 mg/dL — ABNORMAL HIGH (ref 0.50–0.99)
Globulin: 2.6 g/dL (calc) (ref 1.9–3.7)
Glucose, Bld: 82 mg/dL (ref 65–99)
Potassium: 4.1 mmol/L (ref 3.5–5.3)
Sodium: 138 mmol/L (ref 135–146)
Total Bilirubin: 0.2 mg/dL (ref 0.2–1.2)
Total Protein: 6.8 g/dL (ref 6.1–8.1)
eGFR: 73 mL/min/{1.73_m2} (ref >=60)

## 2021-07-22 LAB — LIPID PANEL
Cholesterol: 167 mg/dL (ref <200)
HDL: 42 mg/dL — ABNORMAL LOW (ref >=50)
LDL Cholesterol (Calc): 102 mg/dL (calc) — ABNORMAL HIGH
Non-HDL Cholesterol (Calc): 125 mg/dL (calc) (ref <130)
Total CHOL/HDL Ratio: 4 (calc) (ref <5.0)
Triglycerides: 133 mg/dL (ref <150)

## 2021-07-22 LAB — VITAMIN D 25 HYDROXY (VIT D DEFICIENCY, FRACTURES): Vit D, 25-Hydroxy: 10 ng/mL — ABNORMAL LOW (ref 30–100)

## 2021-07-22 LAB — CBC
HCT: 38.4 % (ref 35.0–45.0)
Hemoglobin: 12.7 g/dL (ref 11.7–15.5)
MCH: 28.6 pg (ref 27.0–33.0)
MCHC: 33.1 g/dL (ref 32.0–36.0)
MCV: 86.5 fL (ref 80.0–100.0)
MPV: 9.8 fL (ref 7.5–12.5)
Platelets: 369 10*3/uL (ref 140–400)
RBC: 4.44 10*6/uL (ref 3.80–5.10)
RDW: 12.7 % (ref 11.0–15.0)
WBC: 4.3 10*3/uL (ref 3.8–10.8)

## 2021-07-22 LAB — TSH: TSH: 1.09 mIU/L

## 2021-07-27 ENCOUNTER — Encounter: Payer: Medicaid Other | Admitting: Orthopaedic Surgery

## 2021-09-06 IMAGING — US US MFM FETAL BPP W/O NON-STRESS
1 series · 14 of 28 positions shown · non-contrast
Comparison: none

[Series 1: us mfm fetal bpp w/o non-stress · 46 acquisitions, 14 frames shown]
[im 2/46]
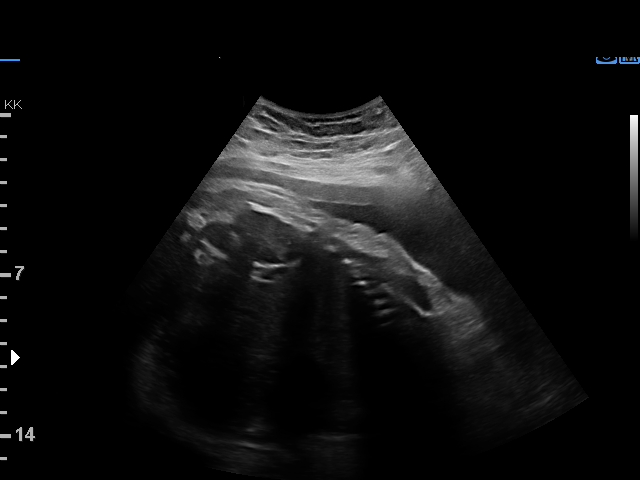
[im 6/46]
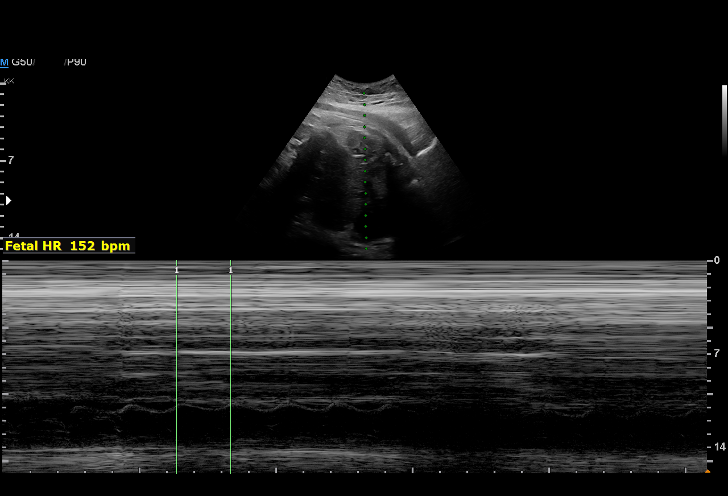
[im 9/46]
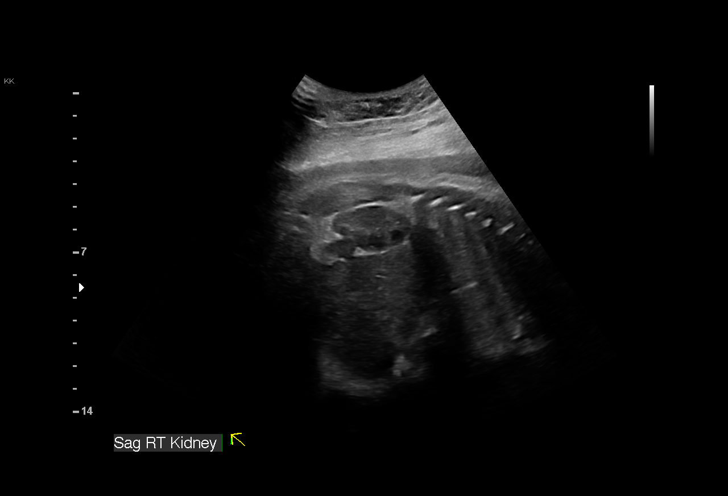
[im 12/46]
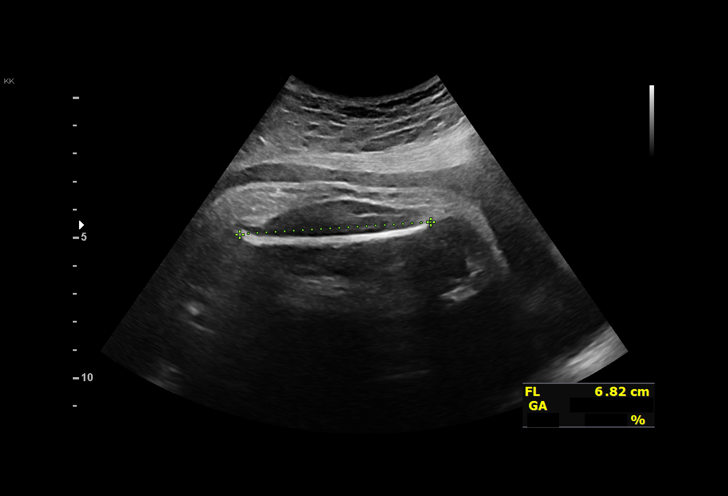
[im 16/46]
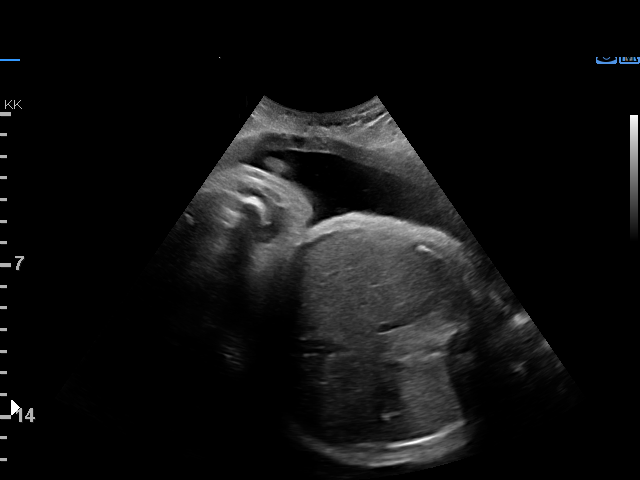
[im 19/46]
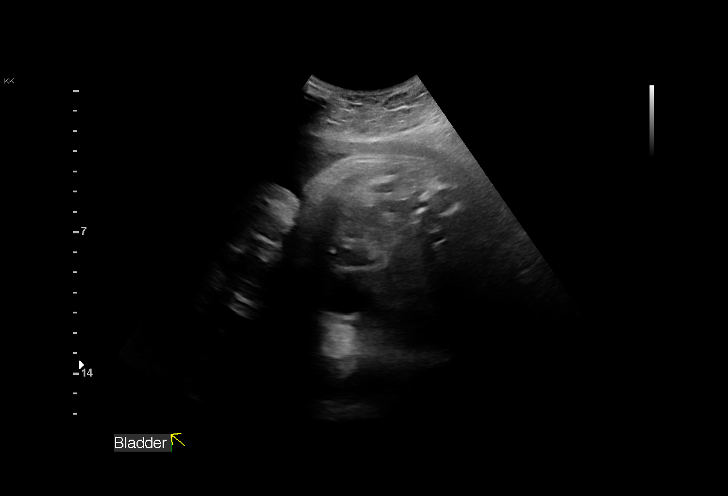
[im 22/46]
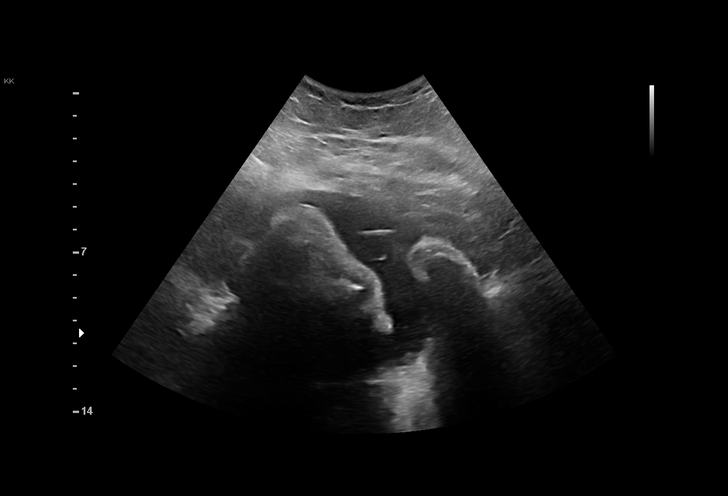
[im 26/46]
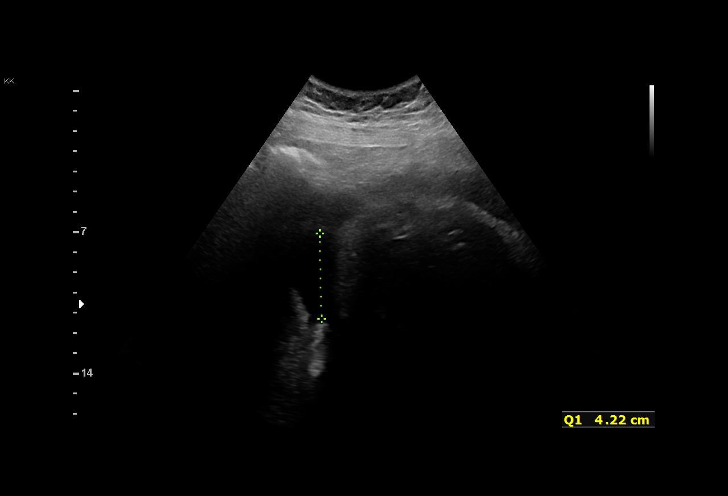
[im 29/46]
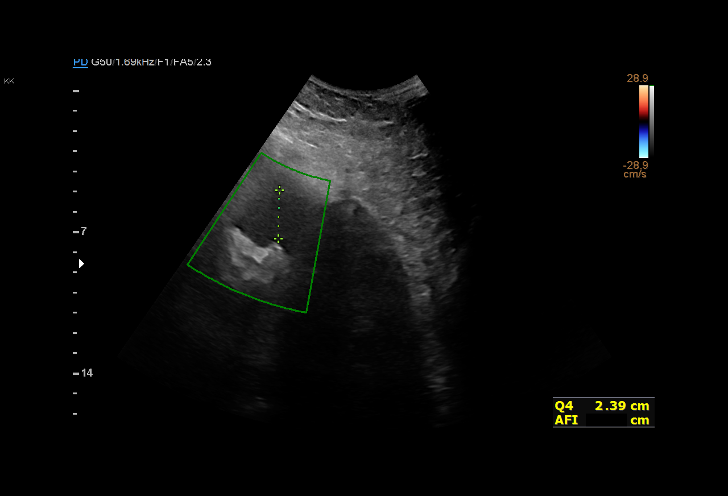
[im 32/46]
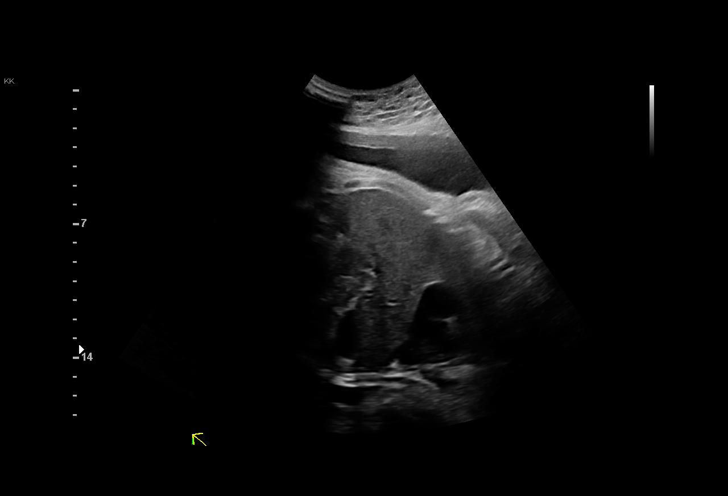
[im 36/46]
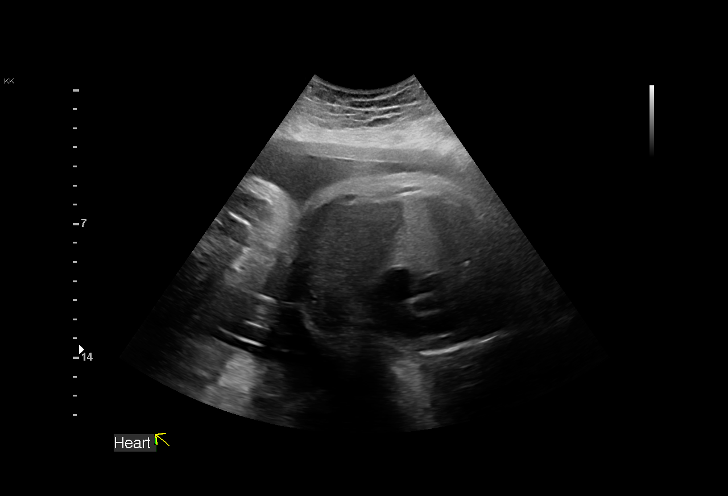
[im 39/46]
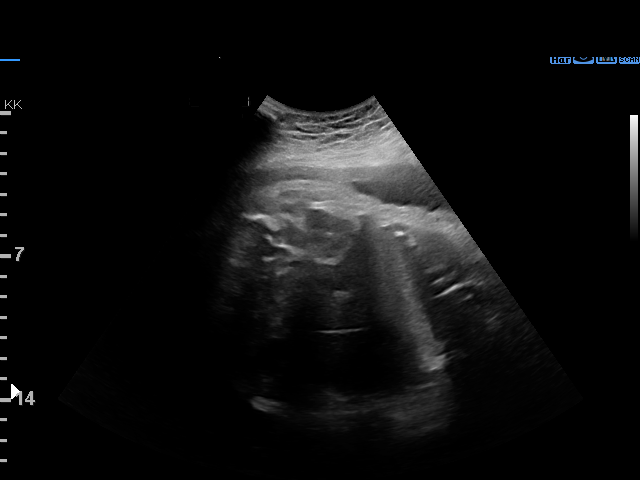
[im 42/46]
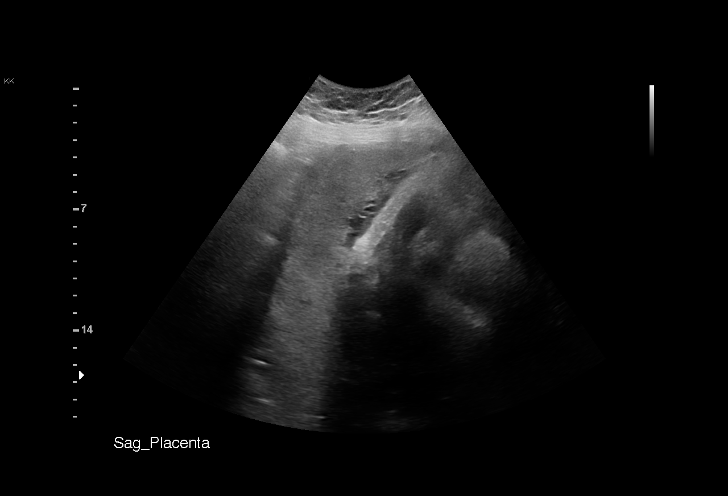
[im 46/46]
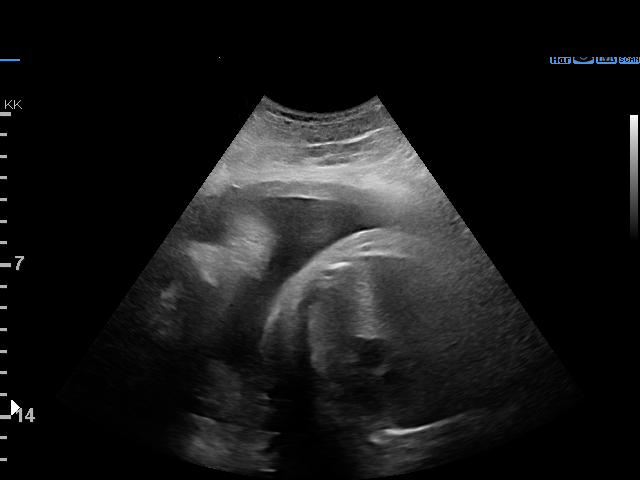

[14 of 28 positions shown; findings below may reference images not displayed]

Indications

 Pre-existing diabetes, type 2, in pregnancy,
 third trimester (Metformin)
 Obesity complicating pregnancy, third
 trimester(Pregravid BMI 43)
 Hypertension - Chronic/Pre-existingn
 (labetalol)
 Genetic carrier (silent Marchellino Legoale)
 Advanced maternal age multigravida 35+,
 third trimester (39 yrs)
 Low Risk NIPS
 35 weeks gestation of pregnancy
Fetal Evaluation

 Num Of Fetuses:         1
 Fetal Heart Rate(bpm):  152
 Cardiac Activity:       Observed
 Presentation:           Cephalic
 Placenta:               Posterior
 P. Cord Insertion:      Previously Visualized

 Amniotic Fluid
 AFI FV:      Within normal limits

 AFI Sum(cm)     %Tile       Largest Pocket(cm)
 18.4            69
 RUQ(cm)       RLQ(cm)       LUQ(cm)        LLQ(cm)

Biophysical Evaluation

 Amniotic F.V:   Pocket => 2 cm             F. Tone:        Observed
 F. Movement:    Observed                   Score:          [DATE]
 F. Breathing:   Observed
Biometry

 BPD:      86.1  mm     G. Age:  34w 5d         28  %    CI:        73.54   %    70 - 86
                                                         FL/HC:      21.6   %    20.1 -
 HC:       319   mm     G. Age:  35w 6d         23  %    HC/AC:      0.87        0.93 -
 AC:      367.5  mm     G. Age:  40w 5d       > 99  %    FL/BPD:     79.9   %    71 - 87
 FL:       68.8  mm     G. Age:  35w 2d         34  %    FL/AC:      18.7   %    20 - 24

 Est. FW:    7575  gm      7 lb 9 oz     98  %
OB History

 Gravidity:    6         Term:   3        Prem:   0        SAB:   1
 TOP:          0       Ectopic:  0        Living: 3
Gestational Age

 LMP:           35w 5d        Date:  10/02/19                 EDD:   07/08/20
 U/S Today:     36w 5d                                        EDD:   07/01/20
 Best:          35w 5d     Det. By:  LMP  (10/02/19)          EDD:   07/08/20
Anatomy

 Cranium:               Previously seen        LVOT:                   Previously seen
 Cavum:                 Previously seen        Aortic Arch:            Previously seen
 Ventricles:            Previously seen        Ductal Arch:            Previously seen
 Choroid Plexus:        Previously seen        Diaphragm:              Appears normal
 Cerebellum:            Previously seen        Stomach:                Appears normal, left
                                                                       sided
 Posterior Fossa:       Previously seen        Abdomen:                Previously seen
 Nuchal Fold:           Previously seen        Abdominal Wall:         Previously seen
 Face:                  Orbits and profile     Cord Vessels:           Previously seen
                        previously seen
 Lips:                  Previously seen        Kidneys:                Appear normal
 Palate:                Previously seen        Bladder:                Appears normal
 Thoracic:              Previously seen        Spine:                  Previously seen
 Heart:                 Appears normal         Upper Extremities:      Previously seen
                        (4CH, axis, and
                        situs)
 RVOT:                  Previously seen        Lower Extremities:      Previously seen

 Other:  Male gender previously seen. Heels/feet, open hands/5th digits, nasal
         bone, VC, 3VV and 3VTV previously visualized.
Cervix Uterus Adnexa

 Cervix
 Not visualized (advanced GA >85wks)
Impression

 Chronic hypertension.  Well controlled on labetalol.  Blood
 pressure today at her office is 128/71 mmHg
 Please gestational diabetes.  Patient takes metformin and
 reports that diabetes is not well controlled.

 On ultrasound, the estimated fetal weight is at the 98th
 percentile.  Cephalic presentation.  Amniotic fluid is normal
 and good fetal activity seen.Antenatal testing is reassuring.
 BPP [DATE].

 Patient was counseled on the limitations of ultrasound and
 accurately estimating fetal weights.  Obstetric history is
 significant for 4 term deliveries.

 She will be undergoing induction of labor on 06/17/2020.
Recommendations

 BPP next week.
                 Bhattarai, Avi

## 2022-03-19 ENCOUNTER — Telehealth: Payer: Self-pay | Admitting: Orthopaedic Surgery

## 2022-03-19 NOTE — Telephone Encounter (Signed)
Pt called requesting a script of prednisone. Pt states is has sever pains in elbow and shoulder pains. Please send to pharmacy on file. Pt phone number is [phone].

## 2022-03-22 ENCOUNTER — Other Ambulatory Visit (HOSPITAL_COMMUNITY): Payer: Self-pay

## 2022-03-25 ENCOUNTER — Encounter (HOSPITAL_BASED_OUTPATIENT_CLINIC_OR_DEPARTMENT_OTHER): Payer: Self-pay

## 2022-03-25 ENCOUNTER — Other Ambulatory Visit: Payer: Self-pay

## 2022-03-25 ENCOUNTER — Emergency Department (HOSPITAL_BASED_OUTPATIENT_CLINIC_OR_DEPARTMENT_OTHER)
Admission: EM | Admit: 2022-03-25 | Discharge: 2022-03-25 | Disposition: A | Discharge status: Home / Self Care | Payer: Medicaid Other | Attending: Emergency Medicine | Admitting: Emergency Medicine

## 2022-03-25 DIAGNOSIS — Z7984 Long term (current) use of oral hypoglycemic drugs: Secondary | ICD-10-CM | POA: Insufficient documentation

## 2022-03-25 DIAGNOSIS — I10 Essential (primary) hypertension: Secondary | ICD-10-CM | POA: Insufficient documentation

## 2022-03-25 DIAGNOSIS — Z79899 Other long term (current) drug therapy: Secondary | ICD-10-CM | POA: Diagnosis not present

## 2022-03-25 DIAGNOSIS — M7582 Other shoulder lesions, left shoulder: Secondary | ICD-10-CM | POA: Insufficient documentation

## 2022-03-25 DIAGNOSIS — Z7982 Long term (current) use of aspirin: Secondary | ICD-10-CM | POA: Insufficient documentation

## 2022-03-25 DIAGNOSIS — M778 Other enthesopathies, not elsewhere classified: Secondary | ICD-10-CM

## 2022-03-25 DIAGNOSIS — E119 Type 2 diabetes mellitus without complications: Secondary | ICD-10-CM | POA: Insufficient documentation

## 2022-03-25 DIAGNOSIS — M25512 Pain in left shoulder: Secondary | ICD-10-CM | POA: Diagnosis present

## 2022-03-25 MED ORDER — IBUPROFEN 800 MG PO TABS
800.0000 mg | ORAL_TABLET | Freq: Once | ORAL | Status: AC
  Administered 2022-03-25: 800 mg via ORAL
  Filled 2022-03-25: qty 1

## 2022-03-25 MED ORDER — METHOCARBAMOL 500 MG PO TABS
500.0000 mg | ORAL_TABLET | Freq: Once | ORAL | Status: AC
  Administered 2022-03-25: 500 mg via ORAL
  Filled 2022-03-25: qty 1

## 2022-03-25 MED ORDER — HYDROCODONE-ACETAMINOPHEN 5-325 MG PO TABS
1.0000 | ORAL_TABLET | Freq: Once | ORAL | Status: AC
  Administered 2022-03-25: 1 via ORAL
  Filled 2022-03-25: qty 1

## 2022-03-25 MED ORDER — IBUPROFEN 600 MG PO TABS: 600.0000 mg | ORAL_TABLET | Freq: Four times a day (QID) | ORAL | 0 refills | Status: DC | PRN

## 2022-03-25 MED ORDER — HYDROCODONE-ACETAMINOPHEN 5-325 MG PO TABS: 1.0000 | ORAL_TABLET | ORAL | 0 refills | Status: DC | PRN

## 2022-03-25 MED ORDER — METHOCARBAMOL 500 MG PO TABS: 500.0000 mg | ORAL_TABLET | Freq: Two times a day (BID) | ORAL | 0 refills | Status: DC | PRN

## 2022-03-25 NOTE — ED Provider Notes (Signed)
[facility] EMERGENCY DEPARTMENT Provider Note   CSN: [id number] Arrival date & time: 03/25/22  7829     History  Chief Complaint  Patient presents with   Shoulder Pain    Michelle Houston is a 41 y.o. female.  Pt is a 40 yo female with pmhx significant for htn, pcos, obesity, and dm.  Pt said she was diagnosed with a rotator cuff injury to the right arm and was supposed to have surgery.  She ended up getting pregnant, so the surgery was cancelled.  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  She uses her left arm more to compensate for her right shoulder injury.  She felt a bad pain in her left arm b/t her left shoulder and elbow.  It felt like a bad muscle pull, but it is not going away.  She saw her doctor on Tuesday, 10/31 and was prescribed a prednisone taper.  She's been taking it, but the pain is worsening.  She has an appt with ortho tomorrow and wanted to see if she could get some medication to help her until then.       Home Medications Prior to Admission medications   Medication Sig Start Date End Date Taking? Authorizing Provider  HYDROcodone-acetaminophen (NORCO/VICODIN) 5-325 MG tablet Take 1 tablet by mouth every 4 (four) hours as needed. 03/25/22  Yes Isla Pence, MD  ibuprofen (ADVIL) 600 MG tablet Take 1 tablet (600 mg total) by mouth every 6 (six) hours as needed. 03/25/22  Yes Isla Pence, MD  methocarbamol (ROBAXIN) 500 MG tablet Take 1 tablet (500 mg total) by mouth 2 (two) times daily as needed for muscle spasms (pain). 03/25/22  Yes Isla Pence, MD  Accu-Chek Softclix Lancets lancets Use as instructed 01/11/20   Chancy Milroy, MD  aspirin EC 81 MG tablet Take 1 tablet (81 mg total) by mouth daily. Take after 12 weeks for prevention of preeclampsia later in pregnancy 12/14/19   Constant, Peggy, MD  Blood Glucose Monitoring Suppl (ACCU-CHEK GUIDE) w/Device KIT 1 Device by Does not apply route 4 (four) times daily.  01/11/20   Chancy Milroy, MD  celecoxib (CELEBREX) 200 MG capsule Take 1 capsule (200 mg total) by mouth 3 (three) times daily with meals as needed. 03/20/21   Mcarthur Rossetti, MD  cyclobenzaprine (FLEXERIL) 10 MG tablet Take 1 tablet (10 mg total) by mouth every 8 (eight) hours as needed for muscle spasms. 05/26/20   Gabriel Carina, CNM  glucose blood (ACCU-CHEK GUIDE) test strip Use to check blood sugars four times a day was instructed 01/11/20   Chancy Milroy, MD  guaiFENesin-codeine (CHERATUSSIN AC) 100-10 MG/5ML syrup Take 5 mLs by mouth 4 (four) times daily as needed for cough. 06/20/21   Barrett Henle, MD  ipratropium-albuterol (DUONEB) 0.5-2.5 (3) MG/3ML SOLN Take 3 mLs by nebulization every 4 (four) hours as needed. 03/06/20   Wieters, Hallie C, PA-C  labetalol (NORMODYNE) 100 MG tablet Take 1 tablet (100 mg total) by mouth 2 (two) times daily. 05/24/20   Lajean Manes, CNM  meloxicam (MOBIC) 15 MG tablet TAKE 1 TABLET BY MOUTH EVERY DAY AS NEEDED FOR PAIN 03/23/21   Mcarthur Rossetti, MD  metFORMIN (GLUCOPHAGE) 500 MG tablet Take 2 tablets (1,000 mg total) by mouth 2 (two) times daily with a meal. 06/19/20 09/17/20  Wende Mott, CNM  Prenatal Vit-Fe Fumarate-FA (PRENATAL MULTIVITAMIN) TABS tablet Take 1  tablet by mouth daily at 12 noon.    [provider]  albuterol (PROVENTIL HFA;VENTOLIN HFA) 108 (90 Base) MCG/ACT inhaler Inhale 2 puffs into the lungs every 6 (six) hours as needed for wheezing or shortness of breath. Patient not taking: Reported on 01/01/2019 06/25/18 05/05/19  Suella Broad A, PA-C  fluticasone [facility]) 50 MCG/ACT nasal spray Place 1 spray into both nostrils daily. Patient not taking: Reported on 01/01/2019 06/25/18 05/05/19  Tacy Learn, PA-C      Allergies    Patient has no known allergies.    Review of Systems   Review of Systems  Musculoskeletal:        Left upper arm pain  All other systems reviewed and are  negative.   Physical Exam Updated Vital Signs BP (!) 169/102 (BP Location: Left Arm)   Pulse 81   Temp 97.9 F (36.6 C) (Oral)   Resp 17   Ht _0  (1.549 m)   Wt 90.7 kg   SpO2 99%   BMI 37.79 kg/m  Physical Exam Vitals and nursing note reviewed.  Constitutional:      Appearance: Normal appearance. She is obese.  HENT:     Head: Normocephalic and atraumatic.     Right Ear: External ear normal.     Left Ear: External ear normal.     Nose: Nose normal.     Mouth/Throat:     Mouth: Mucous membranes are moist.     Pharynx: Oropharynx is clear.  Eyes:     Extraocular Movements: Extraocular movements intact.     Conjunctiva/sclera: Conjunctivae normal.     Pupils: Pupils are equal, round, and reactive to light.  Cardiovascular:     Rate and Rhythm: Normal rate and regular rhythm.     Pulses: Normal pulses.     Heart sounds: Normal heart sounds.  Pulmonary:     Effort: Pulmonary effort is normal.     Breath sounds: Normal breath sounds.  Abdominal:     General: Abdomen is flat. Bowel sounds are normal.     Palpations: Abdomen is soft.  Musculoskeletal:     Cervical back: Normal range of motion and neck supple.     Comments: Pain in her deltoid and triceps muscle when she flexes her shoulder.  No bony deformities.  Good ROM.  Skin:    General: Skin is warm.     Capillary Refill: Capillary refill takes less than 2 seconds.  Neurological:     General: No focal deficit present.     Mental Status: She is alert and oriented to person, place, and time.  Psychiatric:        Mood and Affect: Mood normal.        Behavior: Behavior normal.     ED Results / Procedures / Treatments   Labs (all labs ordered are listed, but only abnormal results are displayed) Labs Reviewed - No data to display  EKG None  Radiology No results found.  Procedures Procedures    Medications Ordered in ED Medications  methocarbamol (ROBAXIN) tablet 500 mg (has no administration in time  range)  ibuprofen (ADVIL) tablet 800 mg (has no administration in time range)  HYDROcodone-acetaminophen (NORCO/VICODIN) 5-325 MG per tablet 1 tablet (has no administration in time range)    ED Course/ Medical Decision Making/ A&P                           Medical Decision Making  Risk Prescription drug management.   This patient presents to the ED for concern of left upper arm pain, this involves an extensive number of treatment options, and is a complaint that carries with it a high risk of complications and morbidity.  The differential diagnosis includes fx, sprain, tendonitis, bursitis, muscle spasm   Co morbidities that complicate the patient evaluation  htn, pcos, obesity, and dm   Additional history obtained:  Additional history obtained from epic chart review  Imaging Studies ordered:  Pt and I discussed getting x-rays.  I don't think she has a fx, so I think they would be of little help.  I told her we could get one if she wanted, but she declined.  As she has an appt with ortho tomorrow, she can get them done then if they think it is necessary.  Medicines ordered and prescription drug management:  I ordered medication including ibuprofen, robaxin, and lortab  for pain  Reevaluation of the patient after these medicines showed that the patient improved I have reviewed the patients home medicines and have made adjustments as needed   Problem List / ED Course:  Left upper arm pain:  likely tendonitis from overuse. Pt given robaxin, ibuprofen,a nd lortab for home as well as a work note.  She is to return if worse. She is to keep her ortho appt.   Social Determinants of Health:  Lives at home   Dispostion:  After consideration of the diagnostic results and the patients response to treatment, I feel that the patent would benefit from discharge with outpatient f/u.          Final Clinical Impression(s) / ED Diagnoses Final diagnoses:  Tendinitis of left  shoulder    Rx / DC Orders ED Discharge Orders          Ordered    methocarbamol (ROBAXIN) 500 MG tablet  2 times daily PRN        03/25/22 0716    ibuprofen (ADVIL) 600 MG tablet  Every 6 hours PRN        03/25/22 0716    HYDROcodone-acetaminophen (NORCO/VICODIN) 5-325 MG tablet  Every 4 hours PRN        03/25/22 0716              Isla Pence, MD 03/25/22 [phone]

## 2022-03-25 NOTE — ED Triage Notes (Signed)
Pt reports pain radiating from left side of neck down to elbow beginning last Sunday. Pain has worsened throughout the week. Pt was seen at PCP on Tuesday and prescribed prednisone with no improvement. Pt reports hx of tendonitis and bursitis.

## 2022-03-26 ENCOUNTER — Ambulatory Visit: Payer: Medicaid Other | Admitting: Physician Assistant

## 2022-03-26 ENCOUNTER — Other Ambulatory Visit (HOSPITAL_COMMUNITY): Payer: Self-pay

## 2022-03-26 MED ORDER — OZEMPIC (2 MG/DOSE) 8 MG/3ML ~~LOC~~ SOPN
2.0000 mg | PEN_INJECTOR | SUBCUTANEOUS | 2 refills | Status: DC
Start: 2022-03-26 — End: 2022-06-15
  Filled 2022-03-26: qty 3, 28d supply, fill #0
  Filled 2022-04-18: qty 3, 28d supply, fill #1
  Filled 2022-05-11: qty 3, 28d supply, fill #2

## 2022-03-29 ENCOUNTER — Other Ambulatory Visit (HOSPITAL_COMMUNITY): Payer: Self-pay

## 2022-04-02 ENCOUNTER — Ambulatory Visit (INDEPENDENT_AMBULATORY_CARE_PROVIDER_SITE_OTHER): Payer: Medicaid Other

## 2022-04-02 ENCOUNTER — Encounter: Payer: Self-pay | Admitting: Physician Assistant

## 2022-04-02 ENCOUNTER — Ambulatory Visit (INDEPENDENT_AMBULATORY_CARE_PROVIDER_SITE_OTHER): Payer: Medicaid Other | Admitting: Physician Assistant

## 2022-04-02 DIAGNOSIS — M75121 Complete rotator cuff tear or rupture of right shoulder, not specified as traumatic: Secondary | ICD-10-CM

## 2022-04-02 DIAGNOSIS — G8929 Other chronic pain: Secondary | ICD-10-CM | POA: Diagnosis not present

## 2022-04-02 DIAGNOSIS — M25512 Pain in left shoulder: Secondary | ICD-10-CM | POA: Diagnosis not present

## 2022-04-02 MED ORDER — METHYLPREDNISOLONE ACETATE 40 MG/ML IJ SUSP
40.0000 mg | INTRAMUSCULAR | Status: AC | PRN
  Administered 2022-04-02: 40 mg via INTRA_ARTICULAR

## 2022-04-02 MED ORDER — LIDOCAINE HCL 1 % IJ SOLN
3.0000 mL | INTRAMUSCULAR | Status: AC | PRN
  Administered 2022-04-02: 3 mL

## 2022-04-02 NOTE — Progress Notes (Signed)
Office Visit Note   Patient: Michelle Houston           Date of Birth: 1981-04-07           MRN: [id number] Visit Date: 04/02/2022              Requested by: Nolene Ebbs, MD [address] [place],  [place] [address] PCP: Nolene Ebbs, MD   Assessment & Plan: Visit Diagnoses:  1. Chronic left shoulder pain   2. Nontraumatic complete tear of right rotator cuff     Plan: She would like to proceed with right shoulder arthroscopy with extensive debridement and possible rotator cuff repair in December.  She understands given the amount of time that is passed, she is a smoker and the fact that she is diabetic there is an increased risk that her rotator cuff may not be able to be repaired on the right.  Have her follow-up 1 week postop.  She is given forward flexion exercises to do for the right shoulder wall crawls.  She tolerated the injection well today.  Follow-Up Instructions: Return post op.   Orders:  Orders Placed This Encounter  Procedures   Large Joint Inj   XR Shoulder Left   No orders of the defined types were placed in this encounter.     Procedures: Large Joint Inj: L subacromial bursa on 04/02/2022 5:03 PM Indications: pain Details: 22 G 1.5 in needle, superior approach  Arthrogram: No  Medications: 3 mL lidocaine 1 %; 40 mg methylPREDNISolone acetate 40 MG/ML Outcome: tolerated well, no immediate complications Procedure, treatment alternatives, risks and benefits explained, specific risks discussed. Consent was given by the patient. Immediately prior to procedure a time out was called to verify the correct patient, procedure, equipment, support staff and site/side marked as required. Patient was prepped and draped in the usual sterile fashion.       Clinical Data: No additional findings.   Subjective: Chief Complaint  Patient presents with   Left Shoulder - Pain    HPI Michelle Houston is a 41 year old female well-known to Dr. Lona Millard service.  She  has known full-thickness rotator cuff tear on the right.  She has been scheduled in the past for right shoulder arthroscopy and possible rotator cuff repair but this had to cancel for various reasons in the past.  She is wanted to have this shoulder surgery done sometime in the near future.  She comes in today mainly due to the fact that she has had left shoulder pain for the past 3 weeks.  No known injury.  She did see her primary care physician and they prescribed a prednisone Dosepak.  She finished up with this a week ago.  She then went to the ER for the shoulder pain and was given muscle relaxers Norco and ibuprofen.  She continues to have pain in the arm.  She notes decreased range of motion of the left arm.  She denies any numbness tingling down the arm.  She is diabetic reports that her hemoglobin A1c 2 weeks ago was 5.8.   Review of Systems Denies fevers or chills.  Objective: Vital Signs: There were no vitals taken for this visit.  Physical Exam Constitutional:      Appearance: She is not ill-appearing or diaphoretic.  Neurological:     Mental Status: She is alert and oriented to person, place, and time.  Psychiatric:        Mood and Affect: Mood normal.     Ortho  Exam Bilateral shoulders 5 out of 5 strength with internal rotation against resistance.  Weakness with right external rotation.  Empty can test negative on the left positive on the right.  Liftoff test positive on the right negative on the left.  Left shoulder forward flexion to approximately 120 degrees actively passively I can bring her to only 130.  Postinjection passive range of motion 180 degrees left shoulder. Specialty Comments:  No specialty comments available.  Imaging: XR Shoulder Left  Result Date: 04/02/2022 Left shoulder 3 views: Shoulder is well located.  Glenohumeral joint well-maintained.  No acute fractures.  Mild AC joint changes.  Normal bone density.    PMFS History: Patient Active Problem List    Diagnosis Date Noted   Diabetes mellitus affecting pregnancy 06/17/2020   BMI 45.0-49.9, adult (HCC) 05/17/2020   Maternal obesity affecting pregnancy, antepartum 12/14/2019   Chronic hypertension during pregnancy, antepartum 11/14/2016   Diabetes mellitus affecting pregnancy, antepartum 10/17/2016   AMA (advanced maternal age) multigravida 35+ 09/30/2016   DM (diabetes mellitus) (HCC) 09/19/2016   History of bladder repair surgery 09/19/2016   H/O carpal tunnel repair 09/19/2016   Obese 09/19/2016   Right-sided epistaxis 01/26/2016   Hypertension 01/26/2016   Chronic hypertension 10/13/2013   Past Medical History:  Diagnosis Date   Bronchitis    Diabetes mellitus without complication (HCC)    "being watched for it"   Hypertension    Obesity    PCOS (polycystic ovarian syndrome)    Pneumonia     Family History  Problem Relation Age of Onset   Cancer Maternal Grandmother    Hypertension Mother    Fibroids Mother    Diabetes Paternal Aunt    Stroke Maternal Grandfather    Diabetes Paternal Grandmother    Stroke Paternal Grandmother    Cancer Father        lung   Heart disease Paternal Grandfather     Past Surgical History:  Procedure Laterality Date   BLADDER REPAIR     CARPAL TUNNEL RELEASE     CHOLECYSTECTOMY     Social History   Occupational History   Not on file  Tobacco Use   Smoking status: Every Day    Packs/day: 0.25    Years: 18.00    Total pack years: 4.50    Types: Cigarettes   Smokeless tobacco: Never   Tobacco comments:    10 a day  Vaping Use   Vaping Use: Never used  Substance and Sexual Activity   Alcohol use: No   Drug use: No   Sexual activity: Yes    Partners: Male

## 2022-04-05 ENCOUNTER — Other Ambulatory Visit: Payer: Self-pay

## 2022-04-05 MED ORDER — METHOCARBAMOL 500 MG PO TABS
500.0000 mg | ORAL_TABLET | Freq: Three times a day (TID) | ORAL | 2 refills | Status: DC
  Filled 2022-04-05: qty 60, 20d supply, fill #0

## 2022-04-05 MED ORDER — PANTOPRAZOLE SODIUM 40 MG PO TBEC
40.0000 mg | DELAYED_RELEASE_TABLET | Freq: Every morning | ORAL | 2 refills | Status: AC
  Filled 2022-04-05 – 2022-05-11 (×2): qty 30, 30d supply, fill #0

## 2022-04-18 ENCOUNTER — Other Ambulatory Visit (HOSPITAL_COMMUNITY): Payer: Self-pay

## 2022-05-08 ENCOUNTER — Other Ambulatory Visit: Payer: Self-pay | Admitting: Physician Assistant

## 2022-05-08 ENCOUNTER — Telehealth: Payer: Self-pay | Admitting: Physician Assistant

## 2022-05-08 MED ORDER — ACETAMINOPHEN-CODEINE 300-30 MG PO TABS: 1.0000 | ORAL_TABLET | ORAL | 0 refills | Status: DC | PRN

## 2022-05-08 NOTE — Telephone Encounter (Signed)
Pt called requesting pain medication. She said she has tried so many meds and its not helping. Pt states she has upcoming surgery. Please send to [facility]. Pt phone [phone].

## 2022-05-11 ENCOUNTER — Other Ambulatory Visit: Payer: Self-pay

## 2022-05-11 ENCOUNTER — Other Ambulatory Visit (HOSPITAL_COMMUNITY): Payer: Self-pay

## 2022-05-24 ENCOUNTER — Other Ambulatory Visit: Payer: Self-pay | Admitting: Orthopaedic Surgery

## 2022-05-24 DIAGNOSIS — S46011A Strain of muscle(s) and tendon(s) of the rotator cuff of right shoulder, initial encounter: Secondary | ICD-10-CM

## 2022-05-24 MED ORDER — HYDROCODONE-ACETAMINOPHEN 5-325 MG PO TABS: 1.0000 | ORAL_TABLET | Freq: Four times a day (QID) | ORAL | 0 refills | Status: DC | PRN

## 2022-05-25 ENCOUNTER — Telehealth: Payer: Self-pay | Admitting: Orthopaedic Surgery

## 2022-05-25 ENCOUNTER — Other Ambulatory Visit: Payer: Self-pay | Admitting: Orthopaedic Surgery

## 2022-05-25 MED ORDER — OXYCODONE HCL 5 MG PO TABS: 5.0000 mg | ORAL_TABLET | Freq: Four times a day (QID) | ORAL | 0 refills | Status: DC | PRN

## 2022-05-25 NOTE — Telephone Encounter (Signed)
Patient states she is having some discomfort and she was told the nerveblock would laset 7 days and that she just took a pain pill and its not helping. Please call..[phone]

## 2022-05-25 NOTE — Telephone Encounter (Signed)
Patient called. She needs a prior auth for her pain medication sent to the pharmacy. Her call back number is [phone]

## 2022-05-25 NOTE — Telephone Encounter (Signed)
Patient aware this was called in for her  

## 2022-05-25 NOTE — Telephone Encounter (Signed)
Patient aware per pharmacy Medicaid is most likely not covering because she was just given a Rx for Hydrocodone yesterday, he wanted me to let patient know they can run on a discount card

## 2022-05-28 ENCOUNTER — Telehealth: Payer: Self-pay | Admitting: Radiology

## 2022-05-28 NOTE — Telephone Encounter (Signed)
Patient aware of the below  

## 2022-05-31 ENCOUNTER — Encounter: Payer: Self-pay | Admitting: Physician Assistant

## 2022-05-31 ENCOUNTER — Ambulatory Visit (INDEPENDENT_AMBULATORY_CARE_PROVIDER_SITE_OTHER): Payer: Medicaid Other | Admitting: Physician Assistant

## 2022-05-31 DIAGNOSIS — Z9889 Other specified postprocedural states: Secondary | ICD-10-CM

## 2022-05-31 MED ORDER — HYDROCODONE-ACETAMINOPHEN 5-325 MG PO TABS: 1.0000 | ORAL_TABLET | Freq: Four times a day (QID) | ORAL | 0 refills | Status: DC | PRN

## 2022-05-31 NOTE — Progress Notes (Signed)
HPI: Mrs. Roppolo returns today status post right shoulder arthroscopy with rotator cuff repair.  She overall states she is having significant pain.  She is asking for refill on her pain medicine.  She has been taking oxycodone and ibuprofen but states really not helping feels that Norco is helpful in the past.  She is wearing her sling.  Physical exam: Right shoulder port sites are all well-approximated nylon sutures no signs of infection.  Good range of motion of the right elbow.  Full supination pronation of the forearm.  Hand neurovascular intact.  Impression: Status post right shoulder arthroscopy with rotator cuff repair 05/24/2022.  Plan: Sutures removed.  She is able to get the incisions wet.  She will continue to wear the sling coming out of it for only gentle range of motion of the elbow forearm wrist and hand.  No overhead activity no extreme external rotation.  Will see her back in 1 month at that time we will refer formal therapy.

## 2022-06-06 ENCOUNTER — Other Ambulatory Visit: Payer: Self-pay | Admitting: Orthopaedic Surgery

## 2022-06-06 ENCOUNTER — Telehealth: Payer: Self-pay | Admitting: Orthopaedic Surgery

## 2022-06-06 MED ORDER — OXYCODONE HCL 5 MG PO TABS: 5.0000 mg | ORAL_TABLET | Freq: Four times a day (QID) | ORAL | 0 refills | Status: DC | PRN

## 2022-06-06 MED ORDER — GABAPENTIN 100 MG PO CAPS: 100.0000 mg | ORAL_CAPSULE | Freq: Three times a day (TID) | ORAL | 1 refills | Status: DC

## 2022-06-06 MED ORDER — CYCLOBENZAPRINE HCL 10 MG PO TABS: 10.0000 mg | ORAL_TABLET | Freq: Three times a day (TID) | ORAL | 1 refills | Status: DC | PRN

## 2022-06-06 NOTE — Telephone Encounter (Signed)
Patient needs refill on Rx states they are not lasting the 5 day supply she is getting she has to take 2 at a time they are not working, she would like a new or stronger Rx

## 2022-06-06 NOTE — Telephone Encounter (Signed)
Patient aware of the below message from Dr. Blackman  

## 2022-06-13 ENCOUNTER — Other Ambulatory Visit (HOSPITAL_COMMUNITY): Payer: Self-pay

## 2022-06-15 ENCOUNTER — Other Ambulatory Visit (HOSPITAL_COMMUNITY): Payer: Self-pay

## 2022-06-15 MED ORDER — SEMAGLUTIDE (2 MG/DOSE) 8 MG/3ML ~~LOC~~ SOPN
2.0000 mg | PEN_INJECTOR | SUBCUTANEOUS | 2 refills | Status: DC
  Filled 2022-06-15 – 2022-11-05 (×5): qty 3, 28d supply, fill #0

## 2022-06-18 ENCOUNTER — Other Ambulatory Visit (HOSPITAL_COMMUNITY): Payer: Self-pay

## 2022-06-19 ENCOUNTER — Other Ambulatory Visit (HOSPITAL_COMMUNITY): Payer: Self-pay

## 2022-06-21 ENCOUNTER — Other Ambulatory Visit (HOSPITAL_COMMUNITY): Payer: Self-pay

## 2022-06-26 ENCOUNTER — Other Ambulatory Visit: Payer: Self-pay | Admitting: Orthopaedic Surgery

## 2022-06-26 MED ORDER — OXYCODONE HCL 5 MG PO TABS: 5.0000 mg | ORAL_TABLET | Freq: Four times a day (QID) | ORAL | 0 refills | Status: DC | PRN

## 2022-06-27 ENCOUNTER — Encounter: Payer: Self-pay | Admitting: Orthopaedic Surgery

## 2022-06-27 ENCOUNTER — Ambulatory Visit (INDEPENDENT_AMBULATORY_CARE_PROVIDER_SITE_OTHER): Payer: Medicaid Other | Admitting: Orthopaedic Surgery

## 2022-06-27 ENCOUNTER — Other Ambulatory Visit: Payer: Self-pay

## 2022-06-27 DIAGNOSIS — Z9889 Other specified postprocedural states: Secondary | ICD-10-CM

## 2022-06-27 NOTE — Progress Notes (Signed)
The patient will be 5 weeks tomorrow status post a right shoulder arthroscopy with rotator cuff repair.  This was a complete tear of the supraspinatus tendon and we were able to get the rotator cuff repaired fortunately back to its footprint.  She has been compliant with wearing her sling but coming in and out of the sling to work on range of motion of her shoulder gently.  We have not put her through outpatient physical therapy as of yet.  She does work in housekeeping.  On exam her right shoulder is well located.  The swelling is minimal.  I can easily put her through internal and external rotation and she can hold her shoulder abducted.  At this point we will transition her to outpatient physical therapy to work on range of motion and gentle strengthening of the shoulder.  The goal is getting her back to full work duties and activities by hopefully April.  From our standpoint, we will see her back in 4 weeks to see how she is doing overall.  She understands she will need several weeks of physical therapy.

## 2022-07-11 ENCOUNTER — Encounter: Payer: Self-pay | Admitting: Physical Therapy

## 2022-07-11 ENCOUNTER — Ambulatory Visit: Payer: Medicaid Other | Attending: Orthopaedic Surgery | Admitting: Physical Therapy

## 2022-07-11 DIAGNOSIS — Z9889 Other specified postprocedural states: Secondary | ICD-10-CM | POA: Diagnosis present

## 2022-07-11 DIAGNOSIS — M6281 Muscle weakness (generalized): Secondary | ICD-10-CM | POA: Insufficient documentation

## 2022-07-11 DIAGNOSIS — M25611 Stiffness of right shoulder, not elsewhere classified: Secondary | ICD-10-CM | POA: Diagnosis present

## 2022-07-11 NOTE — Therapy (Signed)
OUTPATIENT PHYSICAL THERAPY SHOULDER EVALUATION   Patient Name: Michelle Houston MRN: [id number] DOB:04/06/1981, 42 y.o., female Today's Date: 07/12/2022  END OF SESSION:  PT End of Session - 07/11/22 0859     Visit Number 1    Number of Visits 12    Date for PT Re-Evaluation 08/22/22    Authorization Type [facility] [employer]    PT Start Time [phone]    PT Stop Time 0930    PT Time Calculation (min) 38 min    Activity Tolerance Patient tolerated treatment well    Behavior During Therapy WFL for tasks assessed/performed             Past Medical History:  Diagnosis Date   Bronchitis    Diabetes mellitus without complication ([place])    "being watched for it"   Hypertension    Obesity    PCOS (polycystic ovarian syndrome)    Pneumonia    Past Surgical History:  Procedure Laterality Date   BLADDER REPAIR     CARPAL TUNNEL RELEASE     CHOLECYSTECTOMY     Patient Active Problem List   Diagnosis Date Noted   Diabetes mellitus affecting pregnancy 06/17/2020   BMI 45.0-49.9, adult ([place]) 05/17/2020   Maternal obesity affecting pregnancy, antepartum 12/14/2019   Chronic hypertension during pregnancy, antepartum 11/14/2016   Diabetes mellitus affecting pregnancy, antepartum 10/17/2016   AMA (advanced maternal age) multigravida 35+ 09/30/2016   DM (diabetes mellitus) ([place]) 09/19/2016   History of bladder repair surgery 09/19/2016   H/O carpal tunnel repair 09/19/2016   Obese 09/19/2016   Right-sided epistaxis 01/26/2016   Hypertension 01/26/2016   Chronic hypertension 10/13/2013    PCP: Nolene Ebbs MD   REFERRING PROVIDER: Jean Rosenthal MD   REFERRING DIAG: Status post rotator cuff repair R  THERAPY DIAG:  Status post arthroscopy of right shoulder - Plan: PT plan of care cert/re-cert  Stiffness of right shoulder, not elsewhere classified - Plan: PT plan of care cert/re-cert  Muscle weakness (generalized) - Plan: PT plan of care cert/re-cert  Rationale for  Evaluation and Treatment: Rehabilitation  ONSET DATE: 05/24/22  SUBJECTIVE:                                                                                                                                                                                      SUBJECTIVE STATEMENT: Patient presents following right shoulder rotator cuff repair with arthroscopy on January 4.  Surgery was done by Dr. Ninfa Linden.prior to surgery she had a complete supraspinatus tear and the doctor was very pleased they were able to repair it. Overall she is doing quite well reporting discontinuing the use of  the sling.  She is using her right upper extremity for home tasks including childcare cleaning etc. she is right-handed.  She reports some shoulder stiffness in the morning.  Her arm does not give her much pain during the day and she feels fairly capable with what she has to do.  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  She is eager to go back to work.    PERTINENT HISTORY: Bursitis, Lt shoulder pain  See above   PAIN:  Are you having pain? Yes: NPRS scale: 0/10 Pain location: Rt shoulder  Pain description: stiffness, sore  Aggravating factors: doing too much Relieving factors: cold  Has not had pain in 2 weeks.  She has been using her arm  Patient has    PRECAUTIONS: Other: none   WEIGHT BEARING RESTRICTIONS: No  FALLS:  Has patient fallen in last 6 months? 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   PLOF: Independent  PATIENT GOALS: I want to be able to lift and do what I used to be able to do , work   NEXT MD VISIT: 4 weeks   OBJECTIVE:   DIAGNOSTIC FINDINGS:  presurgical  PATIENT SURVEYS:  Quick Dash 20/55  COGNITION: Overall cognitive status: Within functional limits for tasks  assessed     SENSATION: WFL  POSTURE: Min rounded shoulders, elevated somewhat bilaterally   UPPER EXTREMITY ROM:   Active ROM Right eval Left eval  Shoulder flexion 150 170  Shoulder extension    Shoulder abduction 138   Shoulder adduction    Shoulder internal rotation T7 70 deg with  60 deg abd  T6  Shoulder external rotation T2, 50 deg with 60 deg abd  T3  Elbow flexion    Elbow extension    Wrist flexion    Wrist extension    Wrist ulnar deviation    Wrist radial deviation    Wrist pronation    Wrist supination    (Blank rows = not tested)  UPPER EXTREMITY MMT:  MMT Right eval Left eval  Shoulder flexion 3+/5 4/5  Shoulder extension    Shoulder abduction 3/5 4/5  Shoulder adduction    Shoulder internal rotation 5/5   Shoulder external rotation 4/5   Middle trapezius    Lower trapezius    Elbow flexion    Elbow extension    Wrist flexion    Wrist extension    Wrist ulnar deviation    Wrist radial deviation    Wrist pronation    Wrist supination    Grip strength (lbs)    (Blank rows = not tested)   PALPATION:  Very minimal TTP anterior superior glenohumeral joint   TODAY'S TREATMENT:  DATE: 07/11/2022   PATIENT EDUCATION: Education details: PT, POC, HEP, avoid overhead lifting and caution with pushing and pulling for now, ask MD about return to work Person educated: Patient Education method: Consulting civil engineer, Media planner, and Handouts Education comprehension: verbalized understanding, returned demonstration, and needs further education  HOME EXERCISE PROGRAM: Access Code: [email or web address] URL: [email or web address] Date: 07/11/2022 Prepared by: Raeford Razor  Exercises - Supine Shoulder External Rotation with Resistance  - 1-2 x daily - 7 x weekly - 2 sets - 10 reps - 5 hold - Shoulder Flexion Wall Slide  with Towel  - 1-2 x daily - 7 x weekly - 2 sets - 10 reps - 5 hold - Scaption Wall Slide with Towel  - 1-2 x daily - 7 x weekly - 2 sets - 10 reps - 5 hold - Standing Bilateral Low Shoulder Row with Anchored Resistance  - 1-2 x daily - 7 x weekly - 2 sets - 10 reps - 5 hold  ASSESSMENT:  CLINICAL IMPRESSION: Patient is a 42y.o. female who was seen today for physical therapy evaluation and treatment for right shoulder status post rotator cuff repair.  Her active range of motion is exceptionally good and her pain is minimal.  She has stiffness at end range of flexion but has good internal and external rotation motion.  She needs full function of her right upper extremity in order to return to caring for her small children and returning to is physically active job  OBJECTIVE IMPAIRMENTS: decreased mobility, decreased ROM, decreased strength, increased fascial restrictions, impaired UE functional use, and pain.   ACTIVITY LIMITATIONS: carrying, lifting, reach over head, and caring for others  PARTICIPATION LIMITATIONS: cleaning, laundry, interpersonal relationship, and occupation  PERSONAL FACTORS: Profession are also affecting patient's functional outcome.   REHAB POTENTIAL: Excellent  CLINICAL DECISION MAKING: Stable/uncomplicated  EVALUATION COMPLEXITY: Low   GOALS: Goals reviewed with patient? Yes  LONG TERM GOALS: Target date: 08/22/2022    Patient will be able to show independence with home exercise program for right upper extremity Baseline: Given on evaluation Goal status: INITIAL  2.  Patient will be able to show AROM in Right UE to WNL in order to return to work Baseline: limited in function reach  and overhead lifting  Goal status: INITIAL  3.  Patient will show Rt UE strength to 4+/5 or better for both function of dominant arm Baseline: 3+/5 to 4/5 in flex, abd  Goal status: INITIAL  4.  Patient will reach to her middle back without increased pain for hygiene,  ADLs Baseline: Min increase in pain Goal status: INITIAL  5.  Patient will improve QuickDASH score to 10/55 as a proxy for improved shoulder function.  Baseline: 20/55 Goal status: INITIAL  PLAN:  PT FREQUENCY: 2x/week  PT DURATION: 6 weeks  PLANNED INTERVENTIONS: Therapeutic exercises, Therapeutic activity, Neuromuscular re-education, Balance training, Gait training, Patient/Family education, Self Care, Joint mobilization, Dry Needling, Electrical stimulation, Cryotherapy, Moist heat, Taping, Vasopneumatic device, Manual therapy, and Re-evaluation  PLAN FOR NEXT SESSION: Check HEP,  progress strength and range of motion in all planes, modalities as needed  Check all possible CPT codes: 97164 - PT Re-evaluation, 97110- Therapeutic Exercise, [id number]- Neuro Re-education, 97140 - Manual Therapy, 97530 - Therapeutic Activities, 97535 - Self Care, 97014 - Electrical stimulation (unattended), 97035 - Ultrasound, and 97016 - Vaso    Check all conditions that are expected to impact treatment: Musculoskeletal disorders and Social determinants of health   If treatment provided at initial evaluation, no  treatment charged due to lack of authorization.      Tyland Klemens, PT 07/12/2022, 12:01 PM  Raeford Razor, PT 07/12/22 12:01 PM Phone: [phone] Fax: [phone]

## 2022-07-17 ENCOUNTER — Ambulatory Visit: Payer: Medicaid Other | Admitting: Physical Therapy

## 2022-07-17 DIAGNOSIS — M25611 Stiffness of right shoulder, not elsewhere classified: Secondary | ICD-10-CM

## 2022-07-17 DIAGNOSIS — M6281 Muscle weakness (generalized): Secondary | ICD-10-CM

## 2022-07-17 DIAGNOSIS — Z9889 Other specified postprocedural states: Secondary | ICD-10-CM | POA: Diagnosis not present

## 2022-07-17 NOTE — Therapy (Signed)
OUTPATIENT PHYSICAL THERAPY TREATMENT NOTE   Patient Name: Michelle Houston MRN: [id number] DOB:Aug 20, 1980, 42 y.o., female Today's Date: 07/17/2022  PCP: Nolene Ebbs MD  REFERRING PROVIDER: Dr. Ninfa Linden  END OF SESSION:   PT End of Session - 07/17/22 0940     Visit Number 2    Number of Visits 12    Date for PT Re-Evaluation 08/22/22    Authorization Type [place] [employer]    PT Start Time 0939    PT Stop Time 1025    PT Time Calculation (min) 46 min    Activity Tolerance Patient tolerated treatment well    Behavior During Therapy WFL for tasks assessed/performed             Past Medical History:  Diagnosis Date   Bronchitis    Diabetes mellitus without complication ([place])    "being watched for it"   Hypertension    Obesity    PCOS (polycystic ovarian syndrome)    Pneumonia    Past Surgical History:  Procedure Laterality Date   BLADDER REPAIR     CARPAL TUNNEL RELEASE     CHOLECYSTECTOMY     Patient Active Problem List   Diagnosis Date Noted   Diabetes mellitus affecting pregnancy 06/17/2020   BMI 45.0-49.9, adult ([place]) 05/17/2020   Maternal obesity affecting pregnancy, antepartum 12/14/2019   Chronic hypertension during pregnancy, antepartum 11/14/2016   Diabetes mellitus affecting pregnancy, antepartum 10/17/2016   AMA (advanced maternal age) multigravida 35+ 09/30/2016   DM (diabetes mellitus) ([place]) 09/19/2016   History of bladder repair surgery 09/19/2016   H/O carpal tunnel repair 09/19/2016   Obese 09/19/2016   Right-sided epistaxis 01/26/2016   Hypertension 01/26/2016   Chronic hypertension 10/13/2013    REFERRING DIAG: Status post rotator cuff repair  THERAPY DIAG:  Status post arthroscopy of right shoulder  Stiffness of right shoulder, not elsewhere classified  Muscle weakness (generalized)  Rationale for Evaluation and Treatment Rehabilitation  PERTINENT HISTORY: see above   PRECAUTIONS: none  SUBJECTIVE:                                                                                                                                                                                       SUBJECTIVE STATEMENT:  No pain .  Have been doing the exercises, they are easy. Sees Dr. Ninfa Linden 07/25/22. The only time it hurts is when I lay on it wrong.  It does not bother me when I lift my daughter or anything.    PAIN:  Are you having pain? No   OBJECTIVE: (objective measures completed at initial evaluation unless otherwise dated)  DIAGNOSTIC FINDINGS:  presurgical  PATIENT SURVEYS:  Quick Dash 20/55   COGNITION: Overall cognitive status: Within functional limits for tasks assessed                                  SENSATION: WFL   POSTURE: Min rounded shoulders, elevated somewhat bilaterally    UPPER EXTREMITY ROM:    Active ROM Right eval Left eval  Shoulder flexion 150 170  Shoulder extension      Shoulder abduction 138    Shoulder adduction      Shoulder internal rotation T7 70 deg with  60 deg abd  T6  Shoulder external rotation T2, 50 deg with 60 deg abd  T3  Elbow flexion      Elbow extension      Wrist flexion      Wrist extension      Wrist ulnar deviation      Wrist radial deviation      Wrist pronation      Wrist supination      (Blank rows = not tested)   UPPER EXTREMITY MMT:   MMT Right eval Left eval  Shoulder flexion 3+/5 4/5  Shoulder extension      Shoulder abduction 3/5 4/5  Shoulder adduction      Shoulder internal rotation 5/5    Shoulder external rotation 4/5    Middle trapezius      Lower trapezius      Elbow flexion      Elbow extension      Wrist flexion      Wrist extension      Wrist ulnar deviation      Wrist radial deviation      Wrist pronation      Wrist supination      Grip strength (lbs)      (Blank rows = not tested)     PALPATION:  Very minimal TTP anterior superior glenohumeral joint             TODAY'S TREATMENT:                                                                                                                                          DATE: 07/11/2022   [facility] Adult PT Treatment:                                                DATE: 07/17/22 Therapeutic Exercise: Pulleys: flexion, scaption and internal rotation 2 min each  UBE 5 min L1  Standing wall slides with towel Horizontal abduction green band x 15  Elbow-triceps green band x 15  Bicep curl 5 lbs x 15 , hammer curl x 15  Supine narrow chest  press x 15 , 3 lbs then 5 lbs (2 sets)  Flexion 2 lb straight arm x 15 x 2 sets  Sidelying scaption , ER 2 lbs each 2 x 15)  Manual Therapy: PROM all planes, brief  Modalities   Ice pack 10 min    PATIENT EDUCATION: Education details: PT, POC, HEP, avoid overhead lifting and caution with pushing and pulling for now, ask MD about return to work Person educated: Patient Education method: Consulting civil engineer, Media planner, and Handouts Education comprehension: verbalized understanding, returned demonstration, and needs further education   HOME EXERCISE PROGRAM: Access Code: [email or web address] URL: [email or web address] Date: 07/11/2022 Prepared by: Raeford Razor   Exercises - Supine Shoulder External Rotation with Resistance  - 1-2 x daily - 7 x weekly - 2 sets - 10 reps - 5 hold - Shoulder Flexion Wall Slide with Towel  - 1-2 x daily - 7 x weekly - 2 sets - 10 reps - 5 hold - Scaption Wall Slide with Towel  - 1-2 x daily - 7 x weekly - 2 sets - 10 reps - 5 hold - Standing Bilateral Low Shoulder Row with Anchored Resistance  - 1-2 x daily - 7 x weekly - 2 sets - 10 reps - 5 hold   ASSESSMENT:   CLINICAL IMPRESSION: Patient is above average in ROM, strength and overall functional use of Rt UE.  She was advised to continue PT at least until she sees Dr.Blackman on 07/25/22. Notable fatigue in shoulder exercises when isolated in supine and sidelying.  Rt elbow painful as well.  Did have min pain in her shoulder today with the  exercises.  Ice post session for pain control.    OBJECTIVE IMPAIRMENTS: decreased mobility, decreased ROM, decreased strength, increased fascial restrictions, impaired UE functional use, and pain.    ACTIVITY LIMITATIONS: carrying, lifting, reach over head, and caring for others   PARTICIPATION LIMITATIONS: cleaning, laundry, interpersonal relationship, and occupation   PERSONAL FACTORS: Profession are also affecting patient's functional outcome.    REHAB POTENTIAL: Excellent   CLINICAL DECISION MAKING: Stable/uncomplicated   EVALUATION COMPLEXITY: Low     GOALS: Goals reviewed with patient? Yes   LONG TERM GOALS: Target date: 08/22/2022     Patient will be able to show independence with home exercise program for right upper extremity Baseline: Given on evaluation Goal status: INITIAL   2.  Patient will be able to show AROM in Right UE to WNL in order to return to work Baseline: limited in function reach  and overhead lifting  Goal status: INITIAL   3.  Patient will show Rt UE strength to 4+/5 or better for both function of dominant arm Baseline: 3+/5 to 4/5 in flex, abd  Goal status: INITIAL   4.  Patient will reach to her middle back without increased pain for hygiene, ADLs Baseline: Min increase in pain Goal status: INITIAL   5.  Patient will improve QuickDASH score to 10/55 as a proxy for improved shoulder function.  Baseline: 20/55 Goal status: INITIAL   PLAN:   PT FREQUENCY: 2x/week   PT DURATION: 6 weeks   PLANNED INTERVENTIONS: Therapeutic exercises, Therapeutic activity, Neuromuscular re-education, Balance training, Gait training, Patient/Family education, Self Care, Joint mobilization, Dry Needling, Electrical stimulation, Cryotherapy, Moist heat, Taping, Vasopneumatic device, Manual therapy, and Re-evaluation   PLAN FOR NEXT SESSION: Check HEP,  progress strength and range of motion in all planes, modalities as needed   Check all possible CPT codes:  97164 - PT Re-evaluation, 97110-  Therapeutic Exercise, [id number]- Neuro Re-education, [phone] - Manual Therapy, 97530 - Therapeutic Activities, [phone] - Self Care, [phone] - Electrical stimulation (unattended), [id number] - Ultrasound, and 97016 - Vaso                         Check all conditions that are expected to impact treatment: Musculoskeletal disorders and Social determinants of health         If treatment provided at initial evaluation, no treatment charged due to lack of authorization.                        Raeford Razor, PT 07/17/22 10:20 AM Phone: [phone] Fax: [phone]

## 2022-07-18 NOTE — Therapy (Incomplete)
OUTPATIENT PHYSICAL THERAPY TREATMENT NOTE   Patient Name: Michelle Houston MRN: [id number] DOB:Oct 08, 1980, 42 y.o., female Today's Date: 07/17/2022  PCP: Nolene Ebbs MD  REFERRING PROVIDER: Dr. Ninfa Linden  END OF SESSION:   PT End of Session - 07/17/22 0940     Visit Number 2    Number of Visits 12    Date for PT Re-Evaluation 08/22/22    Authorization Type [place] [employer]    PT Start Time 0939    PT Stop Time 1025    PT Time Calculation (min) 46 min    Activity Tolerance Patient tolerated treatment well    Behavior During Therapy WFL for tasks assessed/performed             Past Medical History:  Diagnosis Date   Bronchitis    Diabetes mellitus without complication ([place])    "being watched for it"   Hypertension    Obesity    PCOS (polycystic ovarian syndrome)    Pneumonia    Past Surgical History:  Procedure Laterality Date   BLADDER REPAIR     CARPAL TUNNEL RELEASE     CHOLECYSTECTOMY     Patient Active Problem List   Diagnosis Date Noted   Diabetes mellitus affecting pregnancy 06/17/2020   BMI 45.0-49.9, adult ([place]) 05/17/2020   Maternal obesity affecting pregnancy, antepartum 12/14/2019   Chronic hypertension during pregnancy, antepartum 11/14/2016   Diabetes mellitus affecting pregnancy, antepartum 10/17/2016   AMA (advanced maternal age) multigravida 35+ 09/30/2016   DM (diabetes mellitus) ([place]) 09/19/2016   History of bladder repair surgery 09/19/2016   H/O carpal tunnel repair 09/19/2016   Obese 09/19/2016   Right-sided epistaxis 01/26/2016   Hypertension 01/26/2016   Chronic hypertension 10/13/2013    REFERRING DIAG: Status post rotator cuff repair  THERAPY DIAG:  Status post arthroscopy of right shoulder  Stiffness of right shoulder, not elsewhere classified  Muscle weakness (generalized)  Rationale for Evaluation and Treatment Rehabilitation  PERTINENT HISTORY: see above   PRECAUTIONS: none  SUBJECTIVE:                                                                                                                                                                                       SUBJECTIVE STATEMENT:  No pain .  Have been doing the exercises, they are easy. Sees Dr. Ninfa Linden 07/25/22. The only time it hurts is when I lay on it wrong.  It does not bother me when I lift my daughter or anything.    PAIN:  Are you having pain? No   OBJECTIVE: (objective measures completed at initial evaluation unless otherwise dated)  DIAGNOSTIC FINDINGS:  presurgical  PATIENT SURVEYS:  Quick Dash 20/55   COGNITION: Overall cognitive status: Within functional limits for tasks assessed                                  SENSATION: WFL   POSTURE: Min rounded shoulders, elevated somewhat bilaterally    UPPER EXTREMITY ROM:    Active ROM Right eval Left eval  Shoulder flexion 150 170  Shoulder extension      Shoulder abduction 138    Shoulder adduction      Shoulder internal rotation T7 70 deg with  60 deg abd  T6  Shoulder external rotation T2, 50 deg with 60 deg abd  T3  Elbow flexion      Elbow extension      Wrist flexion      Wrist extension      Wrist ulnar deviation      Wrist radial deviation      Wrist pronation      Wrist supination      (Blank rows = not tested)   UPPER EXTREMITY MMT:   MMT Right eval Left eval  Shoulder flexion 3+/5 4/5  Shoulder extension      Shoulder abduction 3/5 4/5  Shoulder adduction      Shoulder internal rotation 5/5    Shoulder external rotation 4/5    Middle trapezius      Lower trapezius      Elbow flexion      Elbow extension      Wrist flexion      Wrist extension      Wrist ulnar deviation      Wrist radial deviation      Wrist pronation      Wrist supination      Grip strength (lbs)      (Blank rows = not tested)     PALPATION:  Very minimal TTP anterior superior glenohumeral joint             TODAY'S TREATMENT:                                                                                                                                          [facility] Adult PT Treatment:                                                DATE: 07/19/22 Therapeutic Exercise: *** Manual Therapy: *** Neuromuscular re-ed: *** Therapeutic Activity: *** Modalities: *** Self Care: [facility] Adult PT Treatment:  DATE: 07/17/22 Therapeutic Exercise: Pulleys: flexion, scaption and internal rotation 2 min each  UBE 5 min L1  Standing wall slides with towel Horizontal abduction green band x 15  Elbow-triceps green band x 15  Bicep curl 5 lbs x 15 , hammer curl x 15  Supine narrow chest press x 15 , 3 lbs then 5 lbs (2 sets)  Flexion 2 lb straight arm x 15 x 2 sets  Sidelying scaption , ER 2 lbs each 2 x 15)  Manual Therapy: PROM all planes, brief  Modalities   Ice pack 10 min    PATIENT EDUCATION: Education details: PT, POC, HEP, avoid overhead lifting and caution with pushing and pulling for now, ask MD about return to work Person educated: Patient Education method: Consulting civil engineer, Media planner, and Handouts Education comprehension: verbalized understanding, returned demonstration, and needs further education   HOME EXERCISE PROGRAM: Access Code: [email or web address] URL: [email or web address] Date: 07/11/2022 Prepared by: Raeford Razor   Exercises - Supine Shoulder External Rotation with Resistance  - 1-2 x daily - 7 x weekly - 2 sets - 10 reps - 5 hold - Shoulder Flexion Wall Slide with Towel  - 1-2 x daily - 7 x weekly - 2 sets - 10 reps - 5 hold - Scaption Wall Slide with Towel  - 1-2 x daily - 7 x weekly - 2 sets - 10 reps - 5 hold - Standing Bilateral Low Shoulder Row with Anchored Resistance  - 1-2 x daily - 7 x weekly - 2 sets - 10 reps - 5 hold   ASSESSMENT:   CLINICAL IMPRESSION: Patient is above average in ROM, strength and overall functional use of Rt UE.  She was advised to continue  PT at least until she sees Dr.Blackman on 07/25/22. Notable fatigue in shoulder exercises when isolated in supine and sidelying.  Rt elbow painful as well.  Did have min pain in her shoulder today with the exercises.  Ice post session for pain control.    OBJECTIVE IMPAIRMENTS: decreased mobility, decreased ROM, decreased strength, increased fascial restrictions, impaired UE functional use, and pain.    ACTIVITY LIMITATIONS: carrying, lifting, reach over head, and caring for others   PARTICIPATION LIMITATIONS: cleaning, laundry, interpersonal relationship, and occupation   PERSONAL FACTORS: Profession are also affecting patient's functional outcome.    REHAB POTENTIAL: Excellent   CLINICAL DECISION MAKING: Stable/uncomplicated   EVALUATION COMPLEXITY: Low     GOALS: Goals reviewed with patient? Yes   LONG TERM GOALS: Target date: 08/22/2022     Patient will be able to show independence with home exercise program for right upper extremity Baseline: Given on evaluation Goal status: INITIAL   2.  Patient will be able to show AROM in Right UE to WNL in order to return to work Baseline: limited in function reach  and overhead lifting  Goal status: INITIAL   3.  Patient will show Rt UE strength to 4+/5 or better for both function of dominant arm Baseline: 3+/5 to 4/5 in flex, abd  Goal status: INITIAL   4.  Patient will reach to her middle back without increased pain for hygiene, ADLs Baseline: Min increase in pain Goal status: INITIAL   5.  Patient will improve QuickDASH score to 10/55 as a proxy for improved shoulder function.  Baseline: 20/55 Goal status: INITIAL   PLAN:   PT FREQUENCY: 2x/week   PT DURATION: 6 weeks   PLANNED INTERVENTIONS: Therapeutic exercises, Therapeutic activity, Neuromuscular re-education, Balance training, Gait training, Patient/Family education,  Self Care, Joint mobilization, Dry Needling, Electrical stimulation, Cryotherapy, Moist heat, Taping,  Vasopneumatic device, Manual therapy, and Re-evaluation   PLAN FOR NEXT SESSION: Check HEP,  progress strength and range of motion in all planes, modalities as needed.  Ashtian Villacis MS, PT 07/18/22 5:09 PM

## 2022-07-19 ENCOUNTER — Ambulatory Visit: Payer: Medicaid Other

## 2022-07-23 NOTE — Therapy (Deleted)
OUTPATIENT PHYSICAL THERAPY TREATMENT NOTE   Patient Name: Michelle Houston MRN: [id number] DOB:06-Apr-1981, 42 y.o., female Today's Date: 07/23/2022  PCP: Nolene Ebbs MD  REFERRING PROVIDER: Dr. Ninfa Linden  END OF SESSION:     Past Medical History:  Diagnosis Date   Bronchitis    Diabetes mellitus without complication ([place])    "being watched for it"   Hypertension    Obesity    PCOS (polycystic ovarian syndrome)    Pneumonia    Past Surgical History:  Procedure Laterality Date   BLADDER REPAIR     CARPAL TUNNEL RELEASE     CHOLECYSTECTOMY     Patient Active Problem List   Diagnosis Date Noted   Diabetes mellitus affecting pregnancy 06/17/2020   BMI 45.0-49.9, adult ([place]) 05/17/2020   Maternal obesity affecting pregnancy, antepartum 12/14/2019   Chronic hypertension during pregnancy, antepartum 11/14/2016   Diabetes mellitus affecting pregnancy, antepartum 10/17/2016   AMA (advanced maternal age) multigravida 35+ 09/30/2016   DM (diabetes mellitus) ([place]) 09/19/2016   History of bladder repair surgery 09/19/2016   H/O carpal tunnel repair 09/19/2016   Obese 09/19/2016   Right-sided epistaxis 01/26/2016   Hypertension 01/26/2016   Chronic hypertension 10/13/2013    REFERRING DIAG: Status post rotator cuff repair  THERAPY DIAG:  No diagnosis found.  Rationale for Evaluation and Treatment Rehabilitation  PERTINENT HISTORY: see above   PRECAUTIONS: none  SUBJECTIVE:                                                                                                                                                                                      SUBJECTIVE STATEMENT:  No pain .  Have been doing the exercises, they are easy. Sees Dr. Ninfa Linden 07/25/22. The only time it hurts is when I lay on it wrong.  It does not bother me when I lift my daughter or anything.    PAIN:  Are you having pain? No   OBJECTIVE: (objective measures completed at initial evaluation  unless otherwise dated)  DIAGNOSTIC FINDINGS:  presurgical   PATIENT SURVEYS:  Quick Dash 20/55   COGNITION: Overall cognitive status: Within functional limits for tasks assessed                                  SENSATION: WFL   POSTURE: Min rounded shoulders, elevated somewhat bilaterally    UPPER EXTREMITY ROM:    Active ROM Right eval Left eval  Shoulder flexion 150 170  Shoulder extension      Shoulder abduction 138    Shoulder adduction  Shoulder internal rotation T7 70 deg with  60 deg abd  T6  Shoulder external rotation T2, 50 deg with 60 deg abd  T3  Elbow flexion      Elbow extension      Wrist flexion      Wrist extension      Wrist ulnar deviation      Wrist radial deviation      Wrist pronation      Wrist supination      (Blank rows = not tested)   UPPER EXTREMITY MMT:   MMT Right eval Left eval  Shoulder flexion 3+/5 4/5  Shoulder extension      Shoulder abduction 3/5 4/5  Shoulder adduction      Shoulder internal rotation 5/5    Shoulder external rotation 4/5    Middle trapezius      Lower trapezius      Elbow flexion      Elbow extension      Wrist flexion      Wrist extension      Wrist ulnar deviation      Wrist radial deviation      Wrist pronation      Wrist supination      Grip strength (lbs)      (Blank rows = not tested)     PALPATION:  Very minimal TTP anterior superior glenohumeral joint             TODAY'S TREATMENT:   [facility] Adult PT Treatment:                                                DATE: 07/24/22 Therapeutic Exercise: *** Manual Therapy: *** Neuromuscular re-ed: *** Therapeutic Activity: *** Modalities: *** Self Care: ***                                                                                                                                     DATE: 07/11/2022   [facility] Adult PT Treatment:                                                DATE: 07/17/22 Therapeutic Exercise: Pulleys: flexion,  scaption and internal rotation 2 min each  UBE 5 min L1  Standing wall slides with towel Horizontal abduction green band x 15  Elbow-triceps green band x 15  Bicep curl 5 lbs x 15 , hammer curl x 15  Supine narrow chest press x 15 , 3 lbs then 5 lbs (2 sets)  Flexion 2 lb straight arm x 15 x 2 sets  Sidelying scaption , ER 2 lbs each 2 x 15)  Manual  Therapy: PROM all planes, brief  Modalities   Ice pack 10 min    PATIENT EDUCATION: Education details: PT, POC, HEP, avoid overhead lifting and caution with pushing and pulling for now, ask MD about return to work Person educated: Patient Education method: Consulting civil engineer, Media planner, and Handouts Education comprehension: verbalized understanding, returned demonstration, and needs further education   HOME EXERCISE PROGRAM: Access Code: [email or web address] URL: [email or web address] Date: 07/11/2022 Prepared by: Raeford Razor   Exercises - Supine Shoulder External Rotation with Resistance  - 1-2 x daily - 7 x weekly - 2 sets - 10 reps - 5 hold - Shoulder Flexion Wall Slide with Towel  - 1-2 x daily - 7 x weekly - 2 sets - 10 reps - 5 hold - Scaption Wall Slide with Towel  - 1-2 x daily - 7 x weekly - 2 sets - 10 reps - 5 hold - Standing Bilateral Low Shoulder Row with Anchored Resistance  - 1-2 x daily - 7 x weekly - 2 sets - 10 reps - 5 hold   ASSESSMENT:   CLINICAL IMPRESSION: Patient is above average in ROM, strength and overall functional use of Rt UE.  She was advised to continue PT at least until she sees Dr.Blackman on 07/25/22. Notable fatigue in shoulder exercises when isolated in supine and sidelying.  Rt elbow painful as well.  Did have min pain in her shoulder today with the exercises.  Ice post session for pain control.    OBJECTIVE IMPAIRMENTS: decreased mobility, decreased ROM, decreased strength, increased fascial restrictions, impaired UE functional use, and pain.    ACTIVITY LIMITATIONS: carrying, lifting, reach  over head, and caring for others   PARTICIPATION LIMITATIONS: cleaning, laundry, interpersonal relationship, and occupation   PERSONAL FACTORS: Profession are also affecting patient's functional outcome.    REHAB POTENTIAL: Excellent   CLINICAL DECISION MAKING: Stable/uncomplicated   EVALUATION COMPLEXITY: Low     GOALS: Goals reviewed with patient? Yes   LONG TERM GOALS: Target date: 08/22/2022     Patient will be able to show independence with home exercise program for right upper extremity Baseline: Given on evaluation Goal status: INITIAL   2.  Patient will be able to show AROM in Right UE to WNL in order to return to work Baseline: limited in function reach  and overhead lifting  Goal status: INITIAL   3.  Patient will show Rt UE strength to 4+/5 or better for both function of dominant arm Baseline: 3+/5 to 4/5 in flex, abd  Goal status: INITIAL   4.  Patient will reach to her middle back without increased pain for hygiene, ADLs Baseline: Min increase in pain Goal status: INITIAL   5.  Patient will improve QuickDASH score to 10/55 as a proxy for improved shoulder function.  Baseline: 20/55 Goal status: INITIAL   PLAN:   PT FREQUENCY: 2x/week   PT DURATION: 6 weeks   PLANNED INTERVENTIONS: Therapeutic exercises, Therapeutic activity, Neuromuscular re-education, Balance training, Gait training, Patient/Family education, Self Care, Joint mobilization, Dry Needling, Electrical stimulation, Cryotherapy, Moist heat, Taping, Vasopneumatic device, Manual therapy, and Re-evaluation   PLAN FOR NEXT SESSION: Check HEP,  progress strength and range of motion in all planes, modalities as needed   Check all possible CPT codes: 97164 - PT Re-evaluation, 97110- Therapeutic Exercise, 97112- Neuro Re-education, 97140 - Manual Therapy, 97530 - Therapeutic Activities, 97535 - Self Care, 97014 - Electrical stimulation (unattended), 97035 - Ultrasound, and 97016 - Vaso  Check all conditions that are expected to impact treatment: Musculoskeletal disorders and Social determinants of health         If treatment provided at initial evaluation, no treatment charged due to lack of authorization.                        Raeford Razor, PT 07/23/22 7:54 PM Phone: [phone] Fax: [phone]

## 2022-07-24 ENCOUNTER — Ambulatory Visit: Payer: Medicaid Other | Admitting: Physical Therapy

## 2022-07-25 ENCOUNTER — Encounter: Payer: Self-pay | Admitting: Orthopaedic Surgery

## 2022-07-25 ENCOUNTER — Ambulatory Visit (INDEPENDENT_AMBULATORY_CARE_PROVIDER_SITE_OTHER): Payer: Medicaid Other | Admitting: Orthopaedic Surgery

## 2022-07-25 DIAGNOSIS — Z9889 Other specified postprocedural states: Secondary | ICD-10-CM

## 2022-07-25 NOTE — Progress Notes (Signed)
The patient is now over 2 months status post a right shoulder arthroscopy with a rotator cuff repair.  She says she is doing great and has been compliant with physical therapy.  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  We talked in length in detail about returning to work and what I be comfortable with his August 20, 2022.  She agrees with that as well.  On exam her right shoulder today she is abducting it better and rotating it better.  There is still some weakness to be expected in just a weeks after surgery but overall it looks like she is making improvements.  She is pleased overall as well.  She was told to be cautious with heavy lifting with that right upper extremity.  Will see her back in 3 months to see how she is doing overall.  I did give her a return to work note.

## 2022-07-26 ENCOUNTER — Encounter: Payer: Self-pay | Admitting: Radiology

## 2022-07-26 ENCOUNTER — Ambulatory Visit: Payer: Medicaid Other | Admitting: Physical Therapy

## 2022-07-26 NOTE — Therapy (Deleted)
OUTPATIENT PHYSICAL THERAPY TREATMENT NOTE   Patient Name: Michelle Houston MRN: [id number] DOB:1980-11-28, 42 y.o., female Today's Date: 07/26/2022  PCP: Nolene Ebbs MD  REFERRING PROVIDER: Dr. Ninfa Linden  END OF SESSION:     Past Medical History:  Diagnosis Date   Bronchitis    Diabetes mellitus without complication ([place])    "being watched for it"   Hypertension    Obesity    PCOS (polycystic ovarian syndrome)    Pneumonia    Past Surgical History:  Procedure Laterality Date   BLADDER REPAIR     CARPAL TUNNEL RELEASE     CHOLECYSTECTOMY     Patient Active Problem List   Diagnosis Date Noted   Diabetes mellitus affecting pregnancy 06/17/2020   BMI 45.0-49.9, adult ([place]) 05/17/2020   Maternal obesity affecting pregnancy, antepartum 12/14/2019   Chronic hypertension during pregnancy, antepartum 11/14/2016   Diabetes mellitus affecting pregnancy, antepartum 10/17/2016   AMA (advanced maternal age) multigravida 35+ 09/30/2016   DM (diabetes mellitus) ([place]) 09/19/2016   History of bladder repair surgery 09/19/2016   H/O carpal tunnel repair 09/19/2016   Obese 09/19/2016   Right-sided epistaxis 01/26/2016   Hypertension 01/26/2016   Chronic hypertension 10/13/2013    REFERRING DIAG: Status post rotator cuff repair  THERAPY DIAG:  No diagnosis found.  Rationale for Evaluation and Treatment Rehabilitation  PERTINENT HISTORY: see above   PRECAUTIONS: none  SUBJECTIVE:                                                                                                                                                                                      SUBJECTIVE STATEMENT:  No pain .  Have been doing the exercises, they are easy. Sees Dr. Ninfa Linden 07/25/22. The only time it hurts is when I lay on it wrong.  It does not bother me when I lift my daughter or anything.    PAIN:  Are you having pain? No   OBJECTIVE: (objective measures completed at initial evaluation  unless otherwise dated)  DIAGNOSTIC FINDINGS:  presurgical   PATIENT SURVEYS:  Quick Dash 20/55   COGNITION: Overall cognitive status: Within functional limits for tasks assessed                                  SENSATION: WFL   POSTURE: Min rounded shoulders, elevated somewhat bilaterally    UPPER EXTREMITY ROM:    Active ROM Right eval Left eval  Shoulder flexion 150 170  Shoulder extension      Shoulder abduction 138    Shoulder adduction  Shoulder internal rotation T7 70 deg with  60 deg abd  T6  Shoulder external rotation T2, 50 deg with 60 deg abd  T3  Elbow flexion      Elbow extension      Wrist flexion      Wrist extension      Wrist ulnar deviation      Wrist radial deviation      Wrist pronation      Wrist supination      (Blank rows = not tested)   UPPER EXTREMITY MMT:   MMT Right eval Left eval  Shoulder flexion 3+/5 4/5  Shoulder extension      Shoulder abduction 3/5 4/5  Shoulder adduction      Shoulder internal rotation 5/5    Shoulder external rotation 4/5    Middle trapezius      Lower trapezius      Elbow flexion      Elbow extension      Wrist flexion      Wrist extension      Wrist ulnar deviation      Wrist radial deviation      Wrist pronation      Wrist supination      Grip strength (lbs)      (Blank rows = not tested)     PALPATION:  Very minimal TTP anterior superior glenohumeral joint             TODAY'S TREATMENT:   [facility] Adult PT Treatment:                                                DATE: 07/26/22 Therapeutic Exercise: *** Manual Therapy: *** Neuromuscular re-ed: *** Therapeutic Activity: *** Modalities: *** Self Care: ***                                                                                                                                     DATE: 07/11/2022   [facility] Adult PT Treatment:                                                DATE: 07/17/22 Therapeutic Exercise: Pulleys: flexion,  scaption and internal rotation 2 min each  UBE 5 min L1  Standing wall slides with towel Horizontal abduction green band x 15  Elbow-triceps green band x 15  Bicep curl 5 lbs x 15 , hammer curl x 15  Supine narrow chest press x 15 , 3 lbs then 5 lbs (2 sets)  Flexion 2 lb straight arm x 15 x 2 sets  Sidelying scaption , ER 2 lbs each 2 x 15)  Manual  Therapy: PROM all planes, brief  Modalities   Ice pack 10 min    PATIENT EDUCATION: Education details: PT, POC, HEP, avoid overhead lifting and caution with pushing and pulling for now, ask MD about return to work Person educated: Patient Education method: Consulting civil engineer, Media planner, and Handouts Education comprehension: verbalized understanding, returned demonstration, and needs further education   HOME EXERCISE PROGRAM: Access Code: [email or web address] URL: [email or web address] Date: 07/11/2022 Prepared by: Raeford Razor   Exercises - Supine Shoulder External Rotation with Resistance  - 1-2 x daily - 7 x weekly - 2 sets - 10 reps - 5 hold - Shoulder Flexion Wall Slide with Towel  - 1-2 x daily - 7 x weekly - 2 sets - 10 reps - 5 hold - Scaption Wall Slide with Towel  - 1-2 x daily - 7 x weekly - 2 sets - 10 reps - 5 hold - Standing Bilateral Low Shoulder Row with Anchored Resistance  - 1-2 x daily - 7 x weekly - 2 sets - 10 reps - 5 hold   ASSESSMENT:   CLINICAL IMPRESSION: Patient is above average in ROM, strength and overall functional use of Rt UE.  She was advised to continue PT at least until she sees Dr.Blackman on 07/25/22. Notable fatigue in shoulder exercises when isolated in supine and sidelying.  Rt elbow painful as well.  Did have min pain in her shoulder today with the exercises.  Ice post session for pain control.    OBJECTIVE IMPAIRMENTS: decreased mobility, decreased ROM, decreased strength, increased fascial restrictions, impaired UE functional use, and pain.    ACTIVITY LIMITATIONS: carrying, lifting, reach  over head, and caring for others   PARTICIPATION LIMITATIONS: cleaning, laundry, interpersonal relationship, and occupation   PERSONAL FACTORS: Profession are also affecting patient's functional outcome.    REHAB POTENTIAL: Excellent   CLINICAL DECISION MAKING: Stable/uncomplicated   EVALUATION COMPLEXITY: Low     GOALS: Goals reviewed with patient? Yes   LONG TERM GOALS: Target date: 08/22/2022     Patient will be able to show independence with home exercise program for right upper extremity Baseline: Given on evaluation Goal status: INITIAL   2.  Patient will be able to show AROM in Right UE to WNL in order to return to work Baseline: limited in function reach  and overhead lifting  Goal status: INITIAL   3.  Patient will show Rt UE strength to 4+/5 or better for both function of dominant arm Baseline: 3+/5 to 4/5 in flex, abd  Goal status: INITIAL   4.  Patient will reach to her middle back without increased pain for hygiene, ADLs Baseline: Min increase in pain Goal status: INITIAL   5.  Patient will improve QuickDASH score to 10/55 as a proxy for improved shoulder function.  Baseline: 20/55 Goal status: INITIAL   PLAN:   PT FREQUENCY: 2x/week   PT DURATION: 6 weeks   PLANNED INTERVENTIONS: Therapeutic exercises, Therapeutic activity, Neuromuscular re-education, Balance training, Gait training, Patient/Family education, Self Care, Joint mobilization, Dry Needling, Electrical stimulation, Cryotherapy, Moist heat, Taping, Vasopneumatic device, Manual therapy, and Re-evaluation   PLAN FOR NEXT SESSION: Check HEP,  progress strength and range of motion in all planes, modalities as needed   Check all possible CPT codes: 97164 - PT Re-evaluation, 97110- Therapeutic Exercise, 97112- Neuro Re-education, 97140 - Manual Therapy, 97530 - Therapeutic Activities, 97535 - Self Care, 97014 - Electrical stimulation (unattended), 97035 - Ultrasound, and 97016 - Vaso  Check all conditions that are expected to impact treatment: Musculoskeletal disorders and Social determinants of health         If treatment provided at initial evaluation, no treatment charged due to lack of authorization.                        Raeford Razor, PT 07/26/22 7:49 AM Phone: [phone] Fax: [phone]

## 2022-07-31 ENCOUNTER — Ambulatory Visit: Payer: Medicaid Other | Admitting: Physical Therapy

## 2022-08-02 ENCOUNTER — Ambulatory Visit: Payer: Medicaid Other | Admitting: Physical Therapy

## 2022-08-20 ENCOUNTER — Other Ambulatory Visit (HOSPITAL_COMMUNITY): Payer: Self-pay

## 2022-08-28 ENCOUNTER — Other Ambulatory Visit: Payer: Self-pay | Admitting: Orthopaedic Surgery

## 2022-10-29 ENCOUNTER — Ambulatory Visit: Payer: Medicaid Other | Admitting: Orthopaedic Surgery

## 2022-10-29 ENCOUNTER — Other Ambulatory Visit (HOSPITAL_COMMUNITY): Payer: Self-pay

## 2022-10-31 ENCOUNTER — Other Ambulatory Visit: Payer: Self-pay | Admitting: Internal Medicine

## 2022-11-01 ENCOUNTER — Other Ambulatory Visit (HOSPITAL_COMMUNITY): Payer: Self-pay

## 2022-11-01 LAB — CBC
HCT: 39.7 % (ref 35.0–45.0)
Hemoglobin: 13.4 g/dL (ref 11.7–15.5)
MCH: 29.8 pg (ref 27.0–33.0)
MCHC: 33.8 g/dL (ref 32.0–36.0)
MCV: 88.2 fL (ref 80.0–100.0)
MPV: 10 fL (ref 7.5–12.5)
Platelets: 287 10*3/uL (ref 140–400)
RBC: 4.5 10*6/uL (ref 3.80–5.10)
RDW: 13.3 % (ref 11.0–15.0)
WBC: 4.2 10*3/uL (ref 3.8–10.8)

## 2022-11-01 LAB — COMPLETE METABOLIC PANEL WITH GFR
AG Ratio: 1.8 (calc) (ref 1.0–2.5)
ALT: 42 U/L — ABNORMAL HIGH (ref 6–29)
AST: 36 U/L — ABNORMAL HIGH (ref 10–30)
Albumin: 4.4 g/dL (ref 3.6–5.1)
Alkaline phosphatase (APISO): 83 U/L (ref 31–125)
BUN: 7 mg/dL (ref 7–25)
CO2: 23 mmol/L (ref 20–32)
Calcium: 9.1 mg/dL (ref 8.6–10.2)
Chloride: 103 mmol/L (ref 98–110)
Creat: 0.93 mg/dL (ref 0.50–0.99)
Globulin: 2.4 g/dL (calc) (ref 1.9–3.7)
Glucose, Bld: 86 mg/dL (ref 65–99)
Potassium: 3.9 mmol/L (ref 3.5–5.3)
Sodium: 136 mmol/L (ref 135–146)
Total Bilirubin: 0.4 mg/dL (ref 0.2–1.2)
Total Protein: 6.8 g/dL (ref 6.1–8.1)
eGFR: 78 mL/min/{1.73_m2} (ref >=60)

## 2022-11-01 LAB — TSH: TSH: 1.05 mIU/L

## 2022-11-01 LAB — VITAMIN D 25 HYDROXY (VIT D DEFICIENCY, FRACTURES): Vit D, 25-Hydroxy: 12 ng/mL — ABNORMAL LOW (ref 30–100)

## 2022-11-02 ENCOUNTER — Other Ambulatory Visit (HOSPITAL_COMMUNITY): Payer: Self-pay

## 2022-11-05 ENCOUNTER — Other Ambulatory Visit (HOSPITAL_COMMUNITY): Payer: Self-pay

## 2022-11-29 ENCOUNTER — Encounter (HOSPITAL_BASED_OUTPATIENT_CLINIC_OR_DEPARTMENT_OTHER): Payer: Self-pay

## 2022-11-29 ENCOUNTER — Other Ambulatory Visit: Payer: Self-pay

## 2022-11-29 ENCOUNTER — Emergency Department (HOSPITAL_BASED_OUTPATIENT_CLINIC_OR_DEPARTMENT_OTHER): Payer: Medicaid Other | Admitting: Radiology

## 2022-11-29 DIAGNOSIS — M25511 Pain in right shoulder: Secondary | ICD-10-CM | POA: Insufficient documentation

## 2022-11-29 DIAGNOSIS — I10 Essential (primary) hypertension: Secondary | ICD-10-CM | POA: Insufficient documentation

## 2022-11-29 DIAGNOSIS — F17210 Nicotine dependence, cigarettes, uncomplicated: Secondary | ICD-10-CM | POA: Diagnosis not present

## 2022-11-29 DIAGNOSIS — E119 Type 2 diabetes mellitus without complications: Secondary | ICD-10-CM | POA: Diagnosis not present

## 2022-11-29 DIAGNOSIS — G8929 Other chronic pain: Secondary | ICD-10-CM | POA: Diagnosis not present

## 2022-11-29 NOTE — ED Triage Notes (Addendum)
Complaining of pain in the right shoulder that started a couple of days ago. Starts in the upper right back, right side of neck, right upper chest, also has a cough that makes the shoulder hurt worse. Just had rotator cuff surgery on January 24,2024

## 2022-11-30 ENCOUNTER — Other Ambulatory Visit: Payer: Self-pay

## 2022-11-30 ENCOUNTER — Emergency Department (HOSPITAL_BASED_OUTPATIENT_CLINIC_OR_DEPARTMENT_OTHER)
Admission: EM | Admit: 2022-11-30 | Discharge: 2022-11-30 | Disposition: A | Discharge status: Home / Self Care | Payer: Medicaid Other | Attending: Emergency Medicine | Admitting: Emergency Medicine

## 2022-11-30 DIAGNOSIS — G8929 Other chronic pain: Secondary | ICD-10-CM

## 2022-11-30 NOTE — ED Provider Notes (Signed)
[facility] Emergency Department Provider Note MRN:  [id number]  Arrival date & time: 11/30/22     Chief Complaint   Shoulder Pain   History of Present Illness   Michelle Houston is a 42 y.o. year-old female with a history of rotator cuff surgery presenting to the ED with chief complaint of shoulder pain.  Right shoulder pain persistent for the past several weeks, started having some aches and pains into the right upper chest, right lateral neck, wanted to make sure there was nothing more serious.  Pain only occurs with movement of the shoulder.  Not having any central chest pain, no dizziness or sweatiness, no nausea vomiting, no shortness of breath, no numbness or weakness to the arms or legs.  Review of Systems  A thorough review of systems was obtained and all systems are negative except as noted in the HPI and PMH.   Patient's Health History    Past Medical History:  Diagnosis Date   Bronchitis    Diabetes mellitus without complication ([facility])    "being watched for it"   Hypertension    Obesity    PCOS (polycystic ovarian syndrome)    Pneumonia     Past Surgical History:  Procedure Laterality Date   BLADDER REPAIR     CARPAL TUNNEL RELEASE     CHOLECYSTECTOMY      Family History  Problem Relation Age of Onset   Cancer Maternal Grandmother    Hypertension Mother    Fibroids Mother    Diabetes Paternal Aunt    Stroke Maternal Grandfather    Diabetes Paternal Grandmother    Stroke Paternal Grandmother    Cancer Father        lung   Heart disease Paternal Grandfather     Social History   Socioeconomic History   Marital status: Legally Separated    Spouse name: Not on file   Number of children: Not on file   Years of education: Not on file   Highest education level: Not on file  Occupational History   Not on file  Tobacco Use   Smoking status: Every Day    Current packs/day: 0.25    Average packs/day: 0.3 packs/day for 18.0 years  (4.5 ttl pk-yrs)    Types: Cigarettes   Smokeless tobacco: Never   Tobacco comments:    10 a day  Vaping Use   Vaping status: Never Used  Substance and Sexual Activity   Alcohol use: No   Drug use: No   Sexual activity: Yes    Partners: Male  Other Topics Concern   Not on file  Social History Narrative   Not on file   Social Determinants of Health   Financial Resource Strain: Not on file  Food Insecurity: Not on file  Transportation Needs: Not on file  Physical Activity: Not on file  Stress: Not on file  Social Connections: Unknown (11/05/2022)   Received from [facility]   Social Network    Social Network: Not on file  Intimate Partner Violence: Unknown (11/05/2022)   Received from [facility]   HITS    Physically Hurt: Not on file    Insult or Talk Down To: Not on file    Threaten Physical Harm: Not on file    Scream or Curse: Not on file     Physical Exam   Vitals:   11/29/22 2224  BP: (!) 166/105  Pulse: 85  Resp: 18  Temp: 97.9 F (36.6  C)  SpO2: 98%    CONSTITUTIONAL: Well-appearing, NAD NEURO/PSYCH:  Alert and oriented x 3, no focal deficits EYES:  eyes equal and reactive ENT/NECK:  no LAD, no JVD CARDIO: Regular rate, well-perfused, normal S1 and S2 PULM:  CTAB no wheezing or rhonchi GI/GU:  non-distended, non-tender MSK/SPINE:  No gross deformities, no edema SKIN:  no rash, atraumatic   *Additional and/or pertinent findings included in MDM below  Diagnostic and Interventional Summary    EKG Interpretation Date/Time:  November 30, 2022 at 00:23:53 Ventricular Rate:   76 PR Interval:   149 QRS Duration:   67 QT Interval:   367 QTC Calculation:  413 R Axis:      Text Interpretation: Sinus rhythm, no concerning ischemic features       Labs Reviewed - No data to display  DG Chest 2 View  Final Result    DG Shoulder Right  Final Result      Medications - No data to display   Procedures  /  Critical Care Procedures  ED Course  and Medical Decision Making  Initial Impression and Ddx Shoulder pain, some radiation into the very upper right chest just below the clavicle.  Only elicited with movement.  Highly doubt cardiopulmonary cause.  Has known tendinitis, has had a rotator cuff repair.  The limb is neurovascularly intact, preserved range of motion, nothing to suggest infection or septic joint.  Normal range of motion of the neck, doubt radicular cause of the pain, no signs or symptoms of myelopathy.  Past medical/surgical history that increases complexity of ED encounter: History of rotator cuff surgery  Interpretation of Diagnostics I personally reviewed the EKG and my interpretation is as follows: Sinus rhythm  Chest x-ray and shoulder x-ray are without acute process, evidence of shoulder arthritis/degenerative changes  Patient Reassessment and Ultimate Disposition/Management     Discharge  Patient management required discussion with the following services or consulting groups:  None  Complexity of Problems Addressed Acute complicated illness or Injury  Additional Data Reviewed and Analyzed Further history obtained from: Further history from spouse/family member  Additional Factors Impacting ED Encounter Risk None  Elmer Sow. Pilar Plate, MD [facility] Emergency Medicine [facility] [email or web address]  Final Clinical Impressions(s) / ED Diagnoses     ICD-10-CM   1. Chronic right shoulder pain  M25.511    G89.29       ED Discharge Orders     None        Discharge Instructions Discussed with and Provided to Patient:    Discharge Instructions      You were evaluated in the Emergency Department and after careful evaluation, we did not find any emergent condition requiring admission or further testing in the hospital.  Your exam/testing today is overall reassuring.  Suspect your pain is related to your shoulder arthritis/tendinitis.  Recommend follow-up with orthopedic  specialist.  Please return to the Emergency Department if you experience any worsening of your condition.   Thank you for allowing [place] to be a part of your care.      Sabas Sous, MD 11/30/22 [phone]

## 2022-11-30 NOTE — Discharge Instructions (Signed)
You were evaluated in the Emergency Department and after careful evaluation, we did not find any emergent condition requiring admission or further testing in the hospital.  Your exam/testing today is overall reassuring.  Suspect your pain is related to your shoulder arthritis/tendinitis.  Recommend follow-up with orthopedic specialist.  Please return to the Emergency Department if you experience any worsening of your condition.   Thank you for allowing [place] to be a part of your care.

## 2022-11-30 NOTE — ED Notes (Signed)
RN reviewed discharge instructions with pt. Pt verbalized understanding and had no further questions. VSS upon discharge.  

## 2022-12-03 ENCOUNTER — Ambulatory Visit (INDEPENDENT_AMBULATORY_CARE_PROVIDER_SITE_OTHER): Payer: Medicaid Other | Admitting: Orthopaedic Surgery

## 2022-12-03 ENCOUNTER — Encounter: Payer: Self-pay | Admitting: Orthopaedic Surgery

## 2022-12-03 VITALS — Ht 61.0 in | Wt 220.0 lb

## 2022-12-03 DIAGNOSIS — M25511 Pain in right shoulder: Secondary | ICD-10-CM | POA: Diagnosis not present

## 2022-12-03 DIAGNOSIS — Z9889 Other specified postprocedural states: Secondary | ICD-10-CM | POA: Diagnosis not present

## 2022-12-03 DIAGNOSIS — G8929 Other chronic pain: Secondary | ICD-10-CM

## 2022-12-03 MED ORDER — METHOCARBAMOL 750 MG PO TABS: 750.0000 mg | ORAL_TABLET | Freq: Three times a day (TID) | ORAL | 1 refills | Status: DC | PRN

## 2022-12-03 NOTE — Progress Notes (Signed)
The patient is close to 6 months status post a right shoulder arthroscopic rotator cuff repair.  She is only 42 years old.  Last week she went to the emergency room because she was having some chest pain and they felt like it was possibly related to her shoulder.  She points to the pectoralis area of her right side but also the trapezius area on the right side as a source of her pain.  She denies any specific right shoulder pain itself.  I did review the x-rays and it does show AC joint arthritis which we did not address at the time of her rotator cuff repair.  On my exam today she is abducting her shoulder appropriately.  She has no pain around the shoulder itself or the [facility] joint.  There are some trapezius pain and some pain in the pectoralis area and this may be more of a muscle strain.  It is only been activity related.  Ibuprofen is certainly help you feel better.  This did give her some reassurance.  I would like to send in Robaxin-750 milligrams to see if this will help as well as that in combination with ibuprofen.  We will see her back in 4 weeks to see how she is doing overall but no x-rays are needed.

## 2022-12-19 ENCOUNTER — Ambulatory Visit: Payer: Medicaid Other | Admitting: Orthopaedic Surgery

## 2023-01-02 ENCOUNTER — Ambulatory Visit: Payer: Medicaid Other | Admitting: Orthopaedic Surgery

## 2023-02-05 ENCOUNTER — Ambulatory Visit
Admission: EM | Admit: 2023-02-05 | Discharge: 2023-02-05 | Disposition: A | Discharge status: Home / Self Care | Payer: Medicaid Other | Attending: Internal Medicine | Admitting: Internal Medicine

## 2023-02-05 DIAGNOSIS — J029 Acute pharyngitis, unspecified: Secondary | ICD-10-CM | POA: Diagnosis present

## 2023-02-05 LAB — POCT RAPID STREP A (OFFICE): Rapid Strep A Screen: NEGATIVE

## 2023-02-05 MED ORDER — AMOXICILLIN 500 MG PO CAPS
500.0000 mg | ORAL_CAPSULE | Freq: Two times a day (BID) | ORAL | 0 refills | Status: AC
Start: 2023-02-05 — End: 2023-02-15

## 2023-02-05 NOTE — ED Triage Notes (Signed)
Pt presents to the office for sore throat x 2 days. Pt denies any other symptoms.

## 2023-02-05 NOTE — ED Provider Notes (Signed)
[facility]    CSN: [id number] Arrival date & time: 02/05/23  1627      History   Chief Complaint Chief Complaint  Patient presents with   Sore Throat    HPI Michelle Houston is a 42 y.o. female.   Patient presents with sore throat that started a few days ago.  Denies any other associated upper respiratory symptoms, cough, fever.  Reports that her daughter tested positive for strep throat a few weeks ago.  She has had ibuprofen for her symptoms.   Sore Throat    Past Medical History:  Diagnosis Date   Bronchitis    Diabetes mellitus without complication (HCC)    "being watched for it"   Hypertension    Obesity    PCOS (polycystic ovarian syndrome)    Pneumonia     Patient Active Problem List   Diagnosis Date Noted   Diabetes mellitus affecting pregnancy 06/17/2020   BMI 45.0-49.9, adult (HCC) 05/17/2020   Maternal obesity affecting pregnancy, antepartum 12/14/2019   Chronic hypertension during pregnancy, antepartum 11/14/2016   Diabetes mellitus affecting pregnancy, antepartum 10/17/2016   AMA (advanced maternal age) multigravida 35+ 09/30/2016   DM (diabetes mellitus) (HCC) 09/19/2016   History of bladder repair surgery 09/19/2016   H/O carpal tunnel repair 09/19/2016   Obese 09/19/2016   Right-sided epistaxis 01/26/2016   Hypertension 01/26/2016   Chronic hypertension 10/13/2013    Past Surgical History:  Procedure Laterality Date   BLADDER REPAIR     CARPAL TUNNEL RELEASE     CHOLECYSTECTOMY      OB History     Gravida  6   Para  5   Term  5   Preterm      AB  1   Living  5      SAB  1   IAB      Ectopic      Multiple  0   Live Births  5            Home Medications    Prior to Admission medications   Medication Sig Start Date End Date Taking? Authorizing Provider  acetaminophen-codeine (TYLENOL #3) 300-30 MG tablet Take 1 tablet by mouth every 4 (four) hours as needed for moderate pain. Patient not  taking: Reported on 07/11/2022 05/08/22   Kirtland Bouchard, PA-C  amoxicillin (AMOXIL) 500 MG capsule Take 1 capsule (500 mg total) by mouth 2 (two) times daily for 10 days. 02/05/23 02/15/23 Yes Gustavus Bryant, FNP  Accu-Chek Softclix Lancets lancets Use as instructed 01/11/20   Hermina Staggers, MD  aspirin EC 81 MG tablet Take 1 tablet (81 mg total) by mouth daily. Take after 12 weeks for prevention of preeclampsia later in pregnancy 12/14/19   Constant, Peggy, MD  Blood Glucose Monitoring Suppl (ACCU-CHEK GUIDE) w/Device KIT 1 Device by Does not apply route 4 (four) times daily. 01/11/20   Hermina Staggers, MD  celecoxib (CELEBREX) 200 MG capsule Take 1 capsule (200 mg total) by mouth 3 (three) times daily with meals as needed. Patient not taking: Reported on 07/11/2022 03/20/21   Kathryne Hitch, MD  cyclobenzaprine (FLEXERIL) 10 MG tablet TAKE 1 TABLET (10 MG TOTAL) BY MOUTH EVERY 8 (EIGHT) HOURS AS NEEDED FOR MUSCLE SPASMS 08/28/22   Kathryne Hitch, MD  gabapentin (NEURONTIN) 100 MG capsule Take 1 capsule (100 mg total) by mouth 3 (three) times daily. 06/06/22   Kathryne Hitch, MD  HYDROcodone-acetaminophen (NORCO/VICODIN) [id number]  MG tablet Take 1-2 tablets by mouth every 6 (six) hours as needed for moderate pain. Patient not taking: Reported on 07/11/2022 05/31/22   Kirtland Bouchard, PA-C  ibuprofen (ADVIL) 600 MG tablet Take 1 tablet (600 mg total) by mouth every 6 (six) hours as needed. 03/25/22   Jacalyn Lefevre, MD  ipratropium-albuterol (DUONEB) 0.5-2.5 (3) MG/3ML SOLN Take 3 mLs by nebulization every 4 (four) hours as needed. 03/06/20   Wieters, Hallie C, PA-C  labetalol (NORMODYNE) 100 MG tablet Take 1 tablet (100 mg total) by mouth 2 (two) times daily. 05/24/20   Sharyon Cable, CNM  metFORMIN (GLUCOPHAGE) 500 MG tablet Take 2 tablets (1,000 mg total) by mouth 2 (two) times daily with a meal. 06/19/20 09/17/20  Rolm Bookbinder, CNM  methocarbamol (ROBAXIN) 750 MG tablet  Take 1 tablet (750 mg total) by mouth every 8 (eight) hours as needed for muscle spasms. 12/03/22   Kathryne Hitch, MD  oxyCODONE (ROXICODONE) 5 MG immediate release tablet Take 1-2 tablets (5-10 mg total) by mouth every 6 (six) hours as needed for severe pain. Patient not taking: Reported on 07/11/2022 06/26/22   Kathryne Hitch, MD  oxyCODONE (ROXICODONE) 5 MG immediate release tablet Take 1-2 tablets (5-10 mg total) by mouth every 6 (six) hours as needed for severe pain. 06/26/22   Kathryne Hitch, MD  pantoprazole (PROTONIX) 40 MG tablet Take 1 tablet (40 mg total) by mouth every morning. 04/05/22     Prenatal Vit-Fe Fumarate-FA (PRENATAL MULTIVITAMIN) TABS tablet Take 1 tablet by mouth daily at 12 noon. Patient not taking: Reported on 07/11/2022    [provider]  Semaglutide, 2 MG/DOSE, 8 MG/3ML SOPN Inject 2 mg into the skin once a week. 06/18/22     albuterol (PROVENTIL HFA;VENTOLIN HFA) 108 (90 Base) MCG/ACT inhaler Inhale 2 puffs into the lungs every 6 (six) hours as needed for wheezing or shortness of breath. Patient not taking: Reported on 01/01/2019 06/25/18 05/05/19  Army Melia A, PA-C  fluticasone [employer]) 50 MCG/ACT nasal spray Place 1 spray into both nostrils daily. Patient not taking: Reported on 01/01/2019 06/25/18 05/05/19  Jeannie Fend, PA-C    Family History Family History  Problem Relation Age of Onset   Cancer Maternal Grandmother    Hypertension Mother    Fibroids Mother    Diabetes Paternal Aunt    Stroke Maternal Grandfather    Diabetes Paternal Grandmother    Stroke Paternal Grandmother    Cancer Father        lung   Heart disease Paternal Grandfather     Social History Social History   Tobacco Use   Smoking status: Every Day    Current packs/day: 0.25    Average packs/day: 0.3 packs/day for 18.0 years (4.5 ttl pk-yrs)    Types: Cigarettes   Smokeless tobacco: Never   Tobacco comments:    10 a day  Vaping Use   Vaping  status: Never Used  Substance Use Topics   Alcohol use: No   Drug use: No     Allergies   Patient has no known allergies.   Review of Systems Review of Systems Per HPI  Physical Exam Triage Vital Signs ED Triage Vitals  Encounter Vitals Group     BP 02/05/23 1639 115/64     Systolic BP Percentile --      Diastolic BP Percentile --      Pulse Rate 02/05/23 1639 96     Resp 02/05/23 1639  16     Temp 02/05/23 1639 98.3 F (36.8 C)     Temp Source 02/05/23 1639 Oral     SpO2 02/05/23 1639 99 %     Weight --      Height --      Head Circumference --      Peak Flow --      Pain Score 02/05/23 1712 3     Pain Loc --      Pain Education --      Exclude from Growth Chart --    No data found.  Updated Vital Signs BP 115/64 (BP Location: Left Arm)   Pulse 96   Temp 98.3 F (36.8 C) (Oral)   Resp 16   LMP 01/15/2023 (Approximate)   SpO2 99%   Visual Acuity Right Eye Distance:   Left Eye Distance:   Bilateral Distance:    Right Eye Near:   Left Eye Near:    Bilateral Near:     Physical Exam Constitutional:      General: She is not in acute distress.    Appearance: Normal appearance. She is not toxic-appearing or diaphoretic.  HENT:     Head: Normocephalic and atraumatic.     Right Ear: Tympanic membrane and ear canal normal.     Left Ear: Tympanic membrane and ear canal normal.     Nose: No congestion.     Mouth/Throat:     Mouth: Mucous membranes are moist.     Pharynx: Posterior oropharyngeal erythema present. No oropharyngeal exudate.     Tonsils: Tonsillar exudate present. No tonsillar abscesses. 1+ on the right. 1+ on the left.  Eyes:     Extraocular Movements: Extraocular movements intact.     Conjunctiva/sclera: Conjunctivae normal.     Pupils: Pupils are equal, round, and reactive to light.  Cardiovascular:     Rate and Rhythm: Normal rate and regular rhythm.     Pulses: Normal pulses.     Heart sounds: Normal heart sounds.  Pulmonary:      Effort: Pulmonary effort is normal. No respiratory distress.     Breath sounds: Normal breath sounds. No wheezing.  Abdominal:     General: Abdomen is flat. Bowel sounds are normal.     Palpations: Abdomen is soft.  Musculoskeletal:        General: Normal range of motion.     Cervical back: Normal range of motion.  Skin:    General: Skin is warm and dry.  Neurological:     General: No focal deficit present.     Mental Status: She is alert and oriented to person, place, and time. Mental status is at baseline.  Psychiatric:        Mood and Affect: Mood normal.        Behavior: Behavior normal.      UC Treatments / Results  Labs (all labs ordered are listed, but only abnormal results are displayed) Labs Reviewed  CULTURE, GROUP A STREP [facility])  POCT RAPID STREP A (OFFICE)    EKG   Radiology No results found.  Procedures Procedures (including critical care time)  Medications Ordered in UC Medications - No data to display  Initial Impression / Assessment and Plan / UC Course  I have reviewed the triage vital signs and the nursing notes.  Pertinent labs & imaging results that were available during my care of the patient were reviewed by me and considered in my medical decision making (see chart for details).  Strep is negative I am still highly suspicious of strep throat given appearance of posterior pharynx on exam.  Therefore, will opt to treat with amoxicillin antibiotic.  Advised supportive care and symptom management.  Throat culture pending.  No signs of peritonsillar abscess on exam.  Advised strict follow-up precautions.  Patient verbalized understanding and was agreeable with plan. Final Clinical Impressions(s) / UC Diagnoses   Final diagnoses:  Acute pharyngitis, unspecified etiology     Discharge Instructions      Your rapid strep was negative but I am still suspicious of strep throat given the way that your throat looks on exam so I have sent an  antibiotic to help treat this.  Follow-up if any symptoms persist or worsen.    ED Prescriptions     Medication Sig Dispense Auth. Provider   amoxicillin (AMOXIL) 500 MG capsule Take 1 capsule (500 mg total) by mouth 2 (two) times daily for 10 days. 20 capsule Gustavus Bryant, [place]      PDMP not reviewed this encounter.   Gustavus Bryant, [place] 02/05/23 [phone]

## 2023-02-05 NOTE — Discharge Instructions (Signed)
Your rapid strep was negative but I am still suspicious of strep throat given the way that your throat looks on exam so I have sent an antibiotic to help treat this.  Follow-up if any symptoms persist or worsen.

## 2023-02-08 LAB — CULTURE, GROUP A STREP (THRC)

## 2023-04-10 ENCOUNTER — Ambulatory Visit: Payer: Medicaid Other | Admitting: Orthopaedic Surgery

## 2023-05-01 ENCOUNTER — Ambulatory Visit: Payer: Medicaid Other | Admitting: Orthopaedic Surgery

## 2023-10-14 ENCOUNTER — Ambulatory Visit (HOSPITAL_COMMUNITY)
Admission: EM | Admit: 2023-10-14 | Discharge: 2023-10-14 | Disposition: A | Discharge status: Home / Self Care | Attending: Emergency Medicine | Admitting: Emergency Medicine

## 2023-10-14 ENCOUNTER — Encounter (HOSPITAL_COMMUNITY): Payer: Self-pay

## 2023-10-14 DIAGNOSIS — Z113 Encounter for screening for infections with a predominantly sexual mode of transmission: Secondary | ICD-10-CM

## 2023-10-14 DIAGNOSIS — N898 Other specified noninflammatory disorders of vagina: Secondary | ICD-10-CM

## 2023-10-14 LAB — POCT URINE PREGNANCY: Preg Test, Ur: NEGATIVE

## 2023-10-14 MED ORDER — FLUCONAZOLE 150 MG PO TABS
150.0000 mg | ORAL_TABLET | Freq: Once | ORAL | 0 refills | Status: AC | PRN
Start: 2023-10-14 — End: ?

## 2023-10-14 NOTE — ED Provider Notes (Signed)
 [facility]    CSN: [id number] Arrival date & time: 10/14/23  1600      History   Chief Complaint Chief Complaint  Patient presents with   Vaginal Itching    HPI Michelle Houston is a 43 y.o. female.  A few days of vaginal itching She did have slight lower abdominal cramping rated 2/10 Unprotected intercourse with new partner. Would like STD testing. Not having urinary symptoms   Would like pregnancy test today LMP 5/2  Past Medical History:  Diagnosis Date   Bronchitis    Diabetes mellitus without complication (HCC)    "being watched for it"   Hypertension    Obesity    PCOS (polycystic ovarian syndrome)    Pneumonia     Patient Active Problem List   Diagnosis Date Noted   Diabetes mellitus affecting pregnancy 06/17/2020   BMI 45.0-49.9, adult (HCC) 05/17/2020   Maternal obesity affecting pregnancy, antepartum 12/14/2019   Chronic hypertension during pregnancy, antepartum 11/14/2016   Diabetes mellitus affecting pregnancy, antepartum 10/17/2016   AMA (advanced maternal age) multigravida 35+ 09/30/2016   DM (diabetes mellitus) (HCC) 09/19/2016   History of bladder repair surgery 09/19/2016   H/O carpal tunnel repair 09/19/2016   Obese 09/19/2016   Right-sided epistaxis 01/26/2016   Hypertension 01/26/2016   Chronic hypertension 10/13/2013    Past Surgical History:  Procedure Laterality Date   BLADDER REPAIR     CARPAL TUNNEL RELEASE     CHOLECYSTECTOMY      OB History     Gravida  6   Para  5   Term  5   Preterm      AB  1   Living  5      SAB  1   IAB      Ectopic      Multiple  0   Live Births  5            Home Medications    Prior to Admission medications   Medication Sig Start Date End Date Taking? Authorizing Provider  fluconazole  (DIFLUCAN ) 150 MG tablet Take 1 tablet (150 mg total) by mouth once as needed for up to 2 doses (take one pill on day 1, and the second pill 3 days later). 10/14/23  Yes  Benjamin Casanas, PA-C  MOUNJARO 10 MG/0.5ML Pen Inject 10 mg into the skin once a week. 10/12/23  Yes [provider]  Accu-Chek Softclix Lancets lancets Use as instructed 01/11/20   Ervin, Michael L, MD  aspirin  EC 81 MG tablet Take 1 tablet (81 mg total) by mouth daily. Take after 12 weeks for prevention of preeclampsia later in pregnancy 12/14/19   Constant, Peggy, MD  Blood Glucose Monitoring Suppl (ACCU-CHEK GUIDE) w/Device KIT 1 Device by Does not apply route 4 (four) times daily. 01/11/20   Ervin, Michael L, MD  ipratropium-albuterol  (DUONEB) 0.5-2.5 (3) MG/3ML SOLN Take 3 mLs by nebulization every 4 (four) hours as needed. 03/06/20   Wieters, Hallie C, PA-C  labetalol  (NORMODYNE ) 100 MG tablet Take 1 tablet (100 mg total) by mouth 2 (two) times daily. 05/24/20   Rogers, Veronica C, CNM  pantoprazole  (PROTONIX ) 40 MG tablet Take 1 tablet (40 mg total) by mouth every morning. 04/05/22     albuterol  (PROVENTIL  HFA;VENTOLIN  HFA) 108 (90 Base) MCG/ACT inhaler Inhale 2 puffs into the lungs every 6 (six) hours as needed for wheezing or shortness of breath. Patient not taking: Reported on 01/01/2019 06/25/18 05/05/19  Abigail Abler,  Sherian Dimitri, PA-C  fluticasone  (FLONASE ) 50 MCG/ACT nasal spray Place 1 spray into both nostrils daily. Patient not taking: Reported on 01/01/2019 06/25/18 05/05/19  Darlis Eisenmenger, PA-C    Family History Family History  Problem Relation Age of Onset   Cancer Maternal Grandmother    Hypertension Mother    Fibroids Mother    Diabetes Paternal Aunt    Stroke Maternal Grandfather    Diabetes Paternal Grandmother    Stroke Paternal Grandmother    Cancer Father        lung   Heart disease Paternal Grandfather     Social History Social History   Tobacco Use   Smoking status: Every Day    Current packs/day: 0.25    Average packs/day: 0.3 packs/day for 18.0 years (4.5 ttl pk-yrs)    Types: Cigarettes   Smokeless tobacco: Never   Tobacco comments:    10 a day  Vaping  Use   Vaping status: Never Used  Substance Use Topics   Alcohol use: No   Drug use: No     Allergies   Patient has no known allergies.   Review of Systems Review of Systems  As per HPI  Physical Exam Triage Vital Signs ED Triage Vitals  Encounter Vitals Group     BP 10/14/23 1707 135/83     Systolic BP Percentile --      Diastolic BP Percentile --      Pulse Rate 10/14/23 1707 83     Resp 10/14/23 1707 16     Temp 10/14/23 1707 98.3 F (36.8 C)     Temp Source 10/14/23 1707 Oral     SpO2 10/14/23 1707 97 %     Weight --      Height --      Head Circumference --      Peak Flow --      Pain Score 10/14/23 1702 2     Pain Loc --      Pain Education --      Exclude from Growth Chart --    No data found.  Updated Vital Signs BP 135/83 (BP Location: Right Arm)   Pulse 83   Temp 98.3 F (36.8 C) (Oral)   Resp 16   LMP 09/20/2023 (Exact Date)   SpO2 97%   Breastfeeding No   Visual Acuity Right Eye Distance:   Left Eye Distance:   Bilateral Distance:    Right Eye Near:   Left Eye Near:    Bilateral Near:     Physical Exam Vitals and nursing note reviewed.  Constitutional:      General: She is not in acute distress. HENT:     Mouth/Throat:     Mouth: Mucous membranes are moist.     Pharynx: Oropharynx is clear.  Eyes:     Conjunctiva/sclera: Conjunctivae normal.     Pupils: Pupils are equal, round, and reactive to light.  Cardiovascular:     Rate and Rhythm: Normal rate and regular rhythm.     Heart sounds: Normal heart sounds.  Pulmonary:     Effort: Pulmonary effort is normal.     Breath sounds: Normal breath sounds.  Abdominal:     Palpations: Abdomen is soft.     Tenderness: There is no abdominal tenderness. There is no right CVA tenderness, left CVA tenderness, guarding or rebound.  Neurological:     Mental Status: She is alert and oriented to person, place, and time.  UC Treatments / Results  Labs (all labs ordered are listed,  but only abnormal results are displayed) Labs Reviewed  POCT URINE PREGNANCY  CERVICOVAGINAL ANCILLARY ONLY    EKG   Radiology No results found.  Procedures Procedures (including critical care time)  Medications Ordered in UC Medications - No data to display  Initial Impression / Assessment and Plan / UC Course  I have reviewed the triage vital signs and the nursing notes.  Pertinent labs & imaging results that were available during my care of the patient were reviewed by me and considered in my medical decision making (see chart for details).  UPT negative Cytology swab pending. Treat positive result as indicated. Defer blood work today per patient request.  Safe sex practices Will also treat for yeast vaginitis with 2 dose fluconazole  Can return if needed  Final Clinical Impressions(s) / UC Diagnoses   Final diagnoses:  Screen for STD (sexually transmitted disease)  Vaginal itching     Discharge Instructions      Fluconazole  one tablet today, the 2nd tablet in 3 days to treat for yeast infection  We will call you if anything on your swab returns positive. You can also see these results on MyChart. Please abstain from sexual intercourse until your results return.  ED Prescriptions     Medication Sig Dispense Auth. Provider   fluconazole  (DIFLUCAN ) 150 MG tablet Take 1 tablet (150 mg total) by mouth once as needed for up to 2 doses (take one pill on day 1, and the second pill 3 days later). 2 tablet Jamea Robicheaux, Ivette Marks, PA-C      PDMP not reviewed this encounter.   Bostyn Bogie, Ivette Marks, PA-C 10/14/23 1745

## 2023-10-14 NOTE — ED Triage Notes (Signed)
 Patient here today with c/o vaginal itching since yesterday. Patient states that she also noticed some abd discomfort.

## 2023-10-14 NOTE — Discharge Instructions (Signed)
 Fluconazole  one tablet today, the 2nd tablet in 3 days to treat for yeast infection  We will call you if anything on your swab returns positive. You can also see these results on MyChart. Please abstain from sexual intercourse until your results return.

## 2023-10-15 LAB — CERVICOVAGINAL ANCILLARY ONLY
Bacterial Vaginitis (gardnerella): POSITIVE — AB
Candida Glabrata: NEGATIVE
Candida Vaginitis: POSITIVE — AB
Chlamydia: NEGATIVE
Comment: NEGATIVE
Comment: NEGATIVE
Comment: NEGATIVE
Comment: NEGATIVE
Comment: NEGATIVE
Comment: NORMAL
Neisseria Gonorrhea: NEGATIVE
Trichomonas: NEGATIVE

## 2023-10-16 ENCOUNTER — Ambulatory Visit (HOSPITAL_COMMUNITY): Payer: Self-pay

## 2023-10-16 MED ORDER — METRONIDAZOLE 500 MG PO TABS: 500.0000 mg | ORAL_TABLET | Freq: Two times a day (BID) | ORAL | 0 refills | Status: AC

## 2023-12-07 ENCOUNTER — Other Ambulatory Visit: Payer: Self-pay

## 2023-12-07 ENCOUNTER — Other Ambulatory Visit (HOSPITAL_COMMUNITY): Payer: Self-pay

## 2023-12-07 ENCOUNTER — Ambulatory Visit (HOSPITAL_COMMUNITY): Attending: Emergency Medicine

## 2023-12-07 ENCOUNTER — Ambulatory Visit (HOSPITAL_COMMUNITY)

## 2023-12-07 ENCOUNTER — Other Ambulatory Visit (HOSPITAL_COMMUNITY)

## 2023-12-07 ENCOUNTER — Encounter (HOSPITAL_COMMUNITY): Payer: Self-pay

## 2023-12-07 ENCOUNTER — Emergency Department (HOSPITAL_COMMUNITY)
Admission: EM | Admit: 2023-12-07 | Discharge: 2023-12-07 | Disposition: A | Discharge status: Home / Self Care | Attending: Emergency Medicine | Admitting: Emergency Medicine

## 2023-12-07 ENCOUNTER — Emergency Department (HOSPITAL_COMMUNITY)

## 2023-12-07 DIAGNOSIS — I10 Essential (primary) hypertension: Secondary | ICD-10-CM | POA: Insufficient documentation

## 2023-12-07 DIAGNOSIS — D259 Leiomyoma of uterus, unspecified: Secondary | ICD-10-CM | POA: Insufficient documentation

## 2023-12-07 DIAGNOSIS — Z7982 Long term (current) use of aspirin: Secondary | ICD-10-CM | POA: Insufficient documentation

## 2023-12-07 DIAGNOSIS — R1032 Left lower quadrant pain: Secondary | ICD-10-CM | POA: Diagnosis present

## 2023-12-07 DIAGNOSIS — Z79899 Other long term (current) drug therapy: Secondary | ICD-10-CM | POA: Insufficient documentation

## 2023-12-07 DIAGNOSIS — E119 Type 2 diabetes mellitus without complications: Secondary | ICD-10-CM | POA: Diagnosis not present

## 2023-12-07 LAB — CBC
HCT: 40.7 % (ref 36.0–46.0)
Hemoglobin: 13.5 g/dL (ref 12.0–15.0)
MCH: 29.5 pg (ref 26.0–34.0)
MCHC: 33.2 g/dL (ref 30.0–36.0)
MCV: 88.9 fL (ref 80.0–100.0)
Platelets: 281 K/uL (ref 150–400)
RBC: 4.58 MIL/uL (ref 3.87–5.11)
RDW: 13.1 % (ref 11.5–15.5)
WBC: 3.6 K/uL — ABNORMAL LOW (ref 4.0–10.5)
nRBC: 0 % (ref 0.0–0.2)

## 2023-12-07 LAB — HCG, SERUM, QUALITATIVE: Preg, Serum: NEGATIVE

## 2023-12-07 NOTE — ED Notes (Signed)
Pelvic cart placed at bedside.  

## 2023-12-07 NOTE — ED Triage Notes (Signed)
 Pt came in via POV d/t unusual vaginal bleeding. States that her menstrual cycle was June 26-30 then stopped. She then had sexual intercourse on June 15 th. Then on July 11 th she began bleeding & is still bleeding till today. Also endorses that she has Lt leg pain & has for the past 4 days & when her leg hurts bad her vaginal bleeding feels heavier. A/Ox4, rates her leg pain 7/10 during triage.

## 2023-12-07 NOTE — Discharge Instructions (Signed)
 You were evaluated in the emergency room for vaginal bleeding and side pain.  You were found to have bilateral uterine fibroids.  Please follow-up with your OB/GYN for further management.

## 2023-12-07 NOTE — ED Provider Notes (Signed)
 Michelle Houston Provider Note   CSN: [id number] Arrival date & time: 12/07/23  1124     Patient presents with: Vaginal Bleeding and Leg Pain   Michelle Houston is a 43 y.o. female history of PCOS, hypertension presents with complaints of vaginal bleeding, left lower quadrant abdominal and left-sided hip pain x 9 days.  Patient reports that she had intercourse and since then she has had persistent vaginal bleeding.  No discharge.  No urinary symptoms.  Her hip pain is worse with ambulating.  No numbness or tingling, no significant back pain.  No history of nephrolithiasis.    Vaginal Bleeding Leg Pain  Past Medical History:  Diagnosis Date   Bronchitis    Diabetes mellitus without complication (HCC)    being watched for it   Hypertension    Obesity    PCOS (polycystic ovarian syndrome)    Pneumonia    Past Surgical History:  Procedure Laterality Date   BLADDER REPAIR     CARPAL TUNNEL RELEASE     CHOLECYSTECTOMY         Prior to Admission medications   Medication Sig Start Date End Date Taking? Authorizing Provider  Accu-Chek Softclix Lancets lancets Use as instructed 01/11/20   Ervin, Michael L, MD  aspirin  EC 81 MG tablet Take 1 tablet (81 mg total) by mouth daily. Take after 12 weeks for prevention of preeclampsia later in pregnancy 12/14/19   Constant, Peggy, MD  Blood Glucose Monitoring Suppl (ACCU-CHEK GUIDE) w/Device KIT 1 Device by Does not apply route 4 (four) times daily. 01/11/20   Ervin, Michael L, MD  fluconazole  (DIFLUCAN ) 150 MG tablet Take 1 tablet (150 mg total) by mouth once as needed for up to 2 doses (take one pill on day 1, and the second pill 3 days later). 10/14/23   Rising, Asberry, PA-C  ipratropium-albuterol  (DUONEB) 0.5-2.5 (3) MG/3ML SOLN Take 3 mLs by nebulization every 4 (four) hours as needed. 03/06/20   Wieters, Hallie C, PA-C  labetalol  (NORMODYNE ) 100 MG tablet Take 1 tablet (100 mg total) by mouth 2  (two) times daily. 05/24/20   Rogers, Veronica C, CNM  MOUNJARO 10 MG/0.5ML Pen Inject 10 mg into the skin once a week. 10/12/23   [provider]  pantoprazole  (PROTONIX ) 40 MG tablet Take 1 tablet (40 mg total) by mouth every morning. 04/05/22     albuterol  (PROVENTIL  HFA;VENTOLIN  HFA) 108 (90 Base) MCG/ACT inhaler Inhale 2 puffs into the lungs every 6 (six) hours as needed for wheezing or shortness of breath. Patient not taking: Reported on 01/01/2019 06/25/18 05/05/19  Beverley Doffing A, PA-C  fluticasone  (FLONASE ) 50 MCG/ACT nasal spray Place 1 spray into both nostrils daily. Patient not taking: Reported on 01/01/2019 06/25/18 05/05/19  Beverley Doffing LABOR, PA-C    Allergies: Patient has no known allergies.    Review of Systems  Genitourinary:  Positive for vaginal bleeding.    Updated Vital Signs BP (!) 151/99 (BP Location: Right Arm)   Pulse 99   Temp 98.3 F (36.8 C) (Oral)   Resp 20   Ht 5' 1 (1.549 m)   Wt 90.7 kg   SpO2 97%   BMI 37.79 kg/m   Physical Exam Vitals and nursing note reviewed.  Constitutional:      General: She is not in acute distress.    Appearance: She is well-developed.  HENT:     Head: Normocephalic and atraumatic.  Eyes:  Conjunctiva/sclera: Conjunctivae normal.  Cardiovascular:     Rate and Rhythm: Normal rate and regular rhythm.     Heart sounds: No murmur heard. Pulmonary:     Effort: Pulmonary effort is normal. No respiratory distress.     Breath sounds: Normal breath sounds.  Abdominal:     Palpations: Abdomen is soft.     Tenderness: There is no abdominal tenderness. There is no right CVA tenderness or left CVA tenderness.  Musculoskeletal:        General: No swelling.     Cervical back: Neck supple.     Comments: No hip or lumbar tenderness  Skin:    General: Skin is warm and dry.     Capillary Refill: Capillary refill takes less than 2 seconds.  Neurological:     Mental Status: She is alert.  Psychiatric:        Mood and  Affect: Mood normal.     (all labs ordered are listed, but only abnormal results are displayed) Labs Reviewed  CBC - Abnormal; Notable for the following components:      Result Value   WBC 3.6 (*)    All other components within normal limits  HCG, SERUM, QUALITATIVE    EKG: None  Radiology: US  PELVIC COMPLETE W TRANSVAGINAL AND TORSION R/O Result Date: 12/07/2023 CLINICAL DATA:  Vaginal bleeding.  Left lower quadrant pain EXAM: TRANSABDOMINAL AND TRANSVAGINAL ULTRASOUND OF PELVIS TECHNIQUE: Both transabdominal and transvaginal ultrasound examinations of the pelvis were performed. Transabdominal technique was performed for global imaging of the pelvis including uterus, ovaries, adnexal regions, and pelvic cul-de-sac. It was necessary to proceed with endovaginal exam following the transabdominal exam to visualize the endometrium and adnexa. COMPARISON:  None Available. FINDINGS: Uterus and endometrium: Transabdominal images are very limited due to overlapping bowel gas and soft tissue as well as a contracted urinary bladder. Poor sonographic window. 7.8 x 4.6 x 5.7 cm = volume: 103.9 mL. Nabothian cysts are seen. Hypoechoic focus along the uterus on the left side posteriorly measures 18 mm consistent with a small fibroid. Additional areas identified as well. Endometrium measures 4 mm in thickness. Right ovary Measurements: 2.9 x 1.6 x 1.8 cm = volume: 4.3 mL. Normal appearance/no adnexal mass. Left ovary Measurements: 4.1 x 1.4 x 2.1 cm = volume: 6.6 mL. Normal appearance/no adnexal mass. Other findings No abnormal free fluid. IMPRESSION: Small uterine fibroids. Thin endometrium. No free fluid in the pelvis. Electronically Signed   By: Ranell Bring M.D.   On: 12/07/2023 14:44     Procedures   Medications Ordered in the ED - No data to display                                  Medical Decision Making Amount and/or Complexity of Data Reviewed Labs: ordered.   This patient presents to the  ED with chief complaint(s) of vaginal bleeding and hip pain.  The complaint involves an extensive differential diagnosis and also carries with it a high risk of complications and morbidity.   Pertinent past medical history as listed in HPI  The differential diagnosis includes  Ovarian cyst, polyp, vaginal trauma, STD, UTI, nephrolithiasis, pyelonephritis, musculoskeletal Additional history obtained: Records reviewed Care Everywhere/External Records  Assessment and management:   Hemodynamically stable, nontoxic-appearing patient presenting with multiple complaints.  Complains of vaginal bleeding with associated left sided hip pain and lower abdominal pain.  States whenever her side hurts  she will have vaginal bleeding.  Not associated with any urinary symptoms.  She is certain it is vaginal bleeding and hematuria.  She notes the symptoms started after she had intercourse.  She is not having any vaginal discharge.  No numbness or tingling down her leg.  On exam her abdomen is nontender she has no lumbar tenderness, negative CVAT, no tenderness to the lateral hip.  Her hemoglobin is stable.  Offered pelvic exam, however with her upcoming OB/GYN appointment she would prefer just to obtain a vaginal ultrasound.  Will perform this today.  Independent ECG interpretation:  none  Independent labs interpretation:  The following labs were independently interpreted:  CBC without significant abnormality, hemoglobin stable  Independent visualization and interpretation of imaging: I independently visualized the following imaging with scope of interpretation limited to determining acute life threatening conditions related to emergency care:  Pelvic ultrasound small uterine fibroids bilaterally   Consultations obtained:   none  Disposition:   Patient will be discharged home. The patient has been appropriately medically screened and/or stabilized in the ED. I have low suspicion for any other emergent medical  condition which would require further screening, evaluation or treatment in the ED or require inpatient management. At time of discharge the patient is hemodynamically stable and in no acute distress. I have discussed work-up results and diagnosis with patient and answered all questions. Patient is agreeable with discharge plan. We discussed strict return precautions for returning to the emergency department and they verbalized understanding.     Social Determinants of Health:   none  This note was dictated with voice recognition software.  Despite best efforts at proofreading, errors may have occurred which can change the documentation meaning.       Final diagnoses:  Uterine leiomyoma, unspecified location    ED Discharge Orders     None          Donnajean Lynwood VEAR DEVONNA 12/07/23 1536    Tegeler, Lonni PARAS, MD 12/07/23 2038

## 2023-12-13 ENCOUNTER — Ambulatory Visit: Admitting: Obstetrics and Gynecology

## 2023-12-13 ENCOUNTER — Other Ambulatory Visit (HOSPITAL_COMMUNITY)
Admission: RE | Admit: 2023-12-13 | Discharge: 2023-12-13 | Disposition: A | Discharge status: Home / Self Care | Source: Ambulatory Visit | Attending: Obstetrics and Gynecology | Admitting: Obstetrics and Gynecology

## 2023-12-13 ENCOUNTER — Encounter: Payer: Self-pay | Admitting: Obstetrics and Gynecology

## 2023-12-13 VITALS — BP 107/73 | HR 93 | Ht 61.0 in | Wt 204.0 lb

## 2023-12-13 DIAGNOSIS — Z1331 Encounter for screening for depression: Secondary | ICD-10-CM | POA: Diagnosis not present

## 2023-12-13 DIAGNOSIS — Z01419 Encounter for gynecological examination (general) (routine) without abnormal findings: Secondary | ICD-10-CM | POA: Insufficient documentation

## 2023-12-13 NOTE — Progress Notes (Signed)
 Subjective:     Michelle Houston is a 43 y.o. female P5 with LMP 11/29/23 and BMI 38 who is here for a comprehensive physical exam. The patient reports heavier and irregular menses. She was seen in the ED on 12/07/23 due to a heavy episode of vaginal bleeding. Bleeding started following intercourse and she does not think the intercourse was too rough. Her menses last 4 days. Patient is without any other complaints. She denies urinary incontinence or issues with bowel movements. Patient is without any other complaints  Past Medical History:  Diagnosis Date   Bronchitis    Diabetes mellitus without complication (HCC)    being watched for it   Hypertension    Obesity    PCOS (polycystic ovarian syndrome)    Pneumonia    Past Surgical History:  Procedure Laterality Date   BLADDER REPAIR     CARPAL TUNNEL RELEASE     CHOLECYSTECTOMY     Family History  Problem Relation Age of Onset   Cancer Maternal Grandmother    Hypertension Mother    Fibroids Mother    Diabetes Paternal Aunt    Stroke Maternal Grandfather    Diabetes Paternal Grandmother    Stroke Paternal Grandmother    Cancer Father        lung   Heart disease Paternal Grandfather     Social History   Socioeconomic History   Marital status: Legally Separated    Spouse name: Not on file   Number of children: Not on file   Years of education: Not on file   Highest education level: Not on file  Occupational History   Not on file  Tobacco Use   Smoking status: Every Day    Current packs/day: 0.25    Average packs/day: 0.3 packs/day for 18.0 years (4.5 ttl pk-yrs)    Types: Cigarettes   Smokeless tobacco: Never   Tobacco comments:    10 a day  Vaping Use   Vaping status: Never Used  Substance and Sexual Activity   Alcohol use: No   Drug use: No   Sexual activity: Yes    Partners: Male  Other Topics Concern   Not on file  Social History Narrative   Not on file   Social Drivers of Health   Financial  Resource Strain: Not on file  Food Insecurity: Not on file  Transportation Needs: Not on file  Physical Activity: Not on file  Stress: Not on file  Social Connections: Unknown (11/05/2022)   Received [facility]   Social Network    Social Network: Not on file  Intimate Partner Violence: Unknown (11/05/2022)   Received from [facility]   HITS    Physically Hurt: Not on file    Insult or Talk Down To: Not on file    Threaten Physical Harm: Not on file    Scream or Curse: Not on file   Health Maintenance  Topic Date Due   FOOT EXAM  Never done   OPHTHALMOLOGY EXAM  Never done   Diabetic kidney evaluation - Urine ACR  Never done   Pneumococcal Vaccine 32-27 Years old (1 of 2 - PCV) Never done   Hepatitis B Vaccines (1 of 3 - 19+ 3-dose series) Never done   HPV VACCINES (1 - 3-dose SCDM series) Never done   HEMOGLOBIN A1C  12/15/2020   COVID-19 Vaccine (1 - 2024-25 season) Never done   Diabetic kidney evaluation - eGFR measurement  10/31/2023   INFLUENZA  VACCINE  12/20/2023   Cervical Cancer Screening (HPV/Pap Cotest)  03/05/2024   DTaP/Tdap/Td (2 - Td or Tdap) 02/27/2027   Hepatitis C Screening  Completed   HIV Screening  Completed   Meningococcal B Vaccine  Aged Out       Review of Systems Pertinent items noted in HPI and remainder of comprehensive ROS otherwise negative.   Objective:  Blood pressure 107/73, pulse 93, height 5' 1 (1.549 m), weight 204 lb (92.5 kg), last menstrual period 11/29/2023.   GENERAL: Well-developed, well-nourished female in no acute distress.  HEENT: Normocephalic, atraumatic. Sclerae anicteric.  NECK: Supple. Normal thyroid.  LUNGS: Clear to auscultation bilaterally.  HEART: Regular rate and rhythm. BREASTS: Symmetric in size. No palpable masses or lymphadenopathy, skin changes, or nipple drainage. ABDOMEN: Soft, nontender, nondistended. No organomegaly. PELVIC: Normal external female genitalia. Vagina is pink and rugated.  Normal  discharge. Normal appearing cervix. Uterus is normal in size. No adnexal mass or tenderness. Chaperone present during the pelvic exam EXTREMITIES: No cyanosis, clubbing, or edema, 2+ distal pulses.     US  PELVIC COMPLETE W TRANSVAGINAL AND TORSION R/O Result Date: 12/07/2023 CLINICAL DATA:  Vaginal bleeding.  Left lower quadrant pain EXAM: TRANSABDOMINAL AND TRANSVAGINAL ULTRASOUND OF PELVIS TECHNIQUE: Both transabdominal and transvaginal ultrasound examinations of the pelvis were performed. Transabdominal technique was performed for global imaging of the pelvis including uterus, ovaries, adnexal regions, and pelvic cul-de-sac. It was necessary to proceed with endovaginal exam following the transabdominal exam to visualize the endometrium and adnexa. COMPARISON:  None Available. FINDINGS: Uterus and endometrium: Transabdominal images are very limited due to overlapping bowel gas and soft tissue as well as a contracted urinary bladder. Poor sonographic window. 7.8 x 4.6 x 5.7 cm = volume: 103.9 mL. Nabothian cysts are seen. Hypoechoic focus along the uterus on the left side posteriorly measures 18 mm consistent with a small fibroid. Additional areas identified as well. Endometrium measures 4 mm in thickness. Right ovary Measurements: 2.9 x 1.6 x 1.8 cm = volume: 4.3 mL. Normal appearance/no adnexal mass. Left ovary Measurements: 4.1 x 1.4 x 2.1 cm = volume: 6.6 mL. Normal appearance/no adnexal mass. Other findings No abnormal free fluid. IMPRESSION: Small uterine fibroids. Thin endometrium. No free fluid in the pelvis. Electronically Signed   By: Ranell Bring M.D.   On: 12/07/2023 14:44    Assessment:    Healthy female exam.      Plan:    Pap smear collected STI screening ordered Patient will be contacted with abnormal results Patient encouraged to keep a menstrual calendar and return if she experiences prolonged menses lasting 10 days or frequent menses every 2 weeks Reassurance provided regarding  recent ultrasound and size and location of 1.8 cm fibroid See After Visit Summary for Counseling Recommendations

## 2023-12-13 NOTE — Progress Notes (Signed)
 43 y.o. GYN presents for AEX/PAP/STD screening. C/o irregular bleeding, Cyst, US  12/07/23.

## 2023-12-14 LAB — HIV ANTIBODY (ROUTINE TESTING W REFLEX): HIV Screen 4th Generation wRfx: NONREACTIVE

## 2023-12-14 LAB — HEPATITIS C ANTIBODY: Hep C Virus Ab: NONREACTIVE

## 2023-12-14 LAB — RPR: RPR Ser Ql: NONREACTIVE

## 2023-12-16 LAB — CERVICOVAGINAL ANCILLARY ONLY
Bacterial Vaginitis (gardnerella): NEGATIVE
Candida Glabrata: NEGATIVE
Candida Vaginitis: NEGATIVE
Chlamydia: NEGATIVE
Comment: NEGATIVE
Comment: NEGATIVE
Comment: NEGATIVE
Comment: NEGATIVE
Comment: NORMAL
Neisseria Gonorrhea: NEGATIVE

## 2023-12-23 LAB — CYTOLOGY - PAP
Comment: NEGATIVE
Diagnosis: NEGATIVE
High risk HPV: NEGATIVE

## 2023-12-25 ENCOUNTER — Ambulatory Visit: Admitting: Orthopaedic Surgery

## 2024-01-06 ENCOUNTER — Telehealth: Admitting: Obstetrics and Gynecology

## 2024-01-06 NOTE — Progress Notes (Signed)
 Patient currently at work and not available for virtual appointment. She is requesting to reschedule appointment

## 2024-01-08 ENCOUNTER — Ambulatory Visit

## 2024-01-13 ENCOUNTER — Ambulatory Visit: Admitting: Obstetrics and Gynecology

## 2024-01-15 ENCOUNTER — Ambulatory Visit

## 2024-02-06 ENCOUNTER — Ambulatory Visit

## 2024-02-17 ENCOUNTER — Ambulatory Visit: Admitting: Obstetrics and Gynecology

## 2024-02-25 ENCOUNTER — Ambulatory Visit

## 2024-03-17 ENCOUNTER — Ambulatory Visit
Admission: RE | Admit: 2024-03-17 | Discharge: 2024-03-17 | Disposition: A | Discharge status: Home / Self Care | Source: Ambulatory Visit | Attending: Obstetrics and Gynecology | Admitting: Obstetrics and Gynecology

## 2024-03-17 DIAGNOSIS — Z01419 Encounter for gynecological examination (general) (routine) without abnormal findings: Secondary | ICD-10-CM

## 2024-03-23 ENCOUNTER — Encounter: Payer: Self-pay | Admitting: Radiology

## 2024-03-25 ENCOUNTER — Other Ambulatory Visit: Payer: Self-pay

## 2024-03-25 ENCOUNTER — Ambulatory Visit (INDEPENDENT_AMBULATORY_CARE_PROVIDER_SITE_OTHER): Admitting: Orthopaedic Surgery

## 2024-03-25 DIAGNOSIS — M25512 Pain in left shoulder: Secondary | ICD-10-CM | POA: Diagnosis not present

## 2024-03-25 DIAGNOSIS — G8929 Other chronic pain: Secondary | ICD-10-CM

## 2024-03-25 DIAGNOSIS — M25511 Pain in right shoulder: Secondary | ICD-10-CM

## 2024-03-25 MED ORDER — LIDOCAINE HCL 1 % IJ SOLN
3.0000 mL | INTRAMUSCULAR | Status: AC | PRN
Start: 2024-03-25 — End: 2024-03-25
  Administered 2024-03-25: 3 mL

## 2024-03-25 MED ORDER — TRAMADOL HCL 50 MG PO TABS: 50.0000 mg | ORAL_TABLET | Freq: Four times a day (QID) | ORAL | 0 refills | Status: DC | PRN

## 2024-03-25 MED ORDER — METHYLPREDNISOLONE ACETATE 40 MG/ML IJ SUSP
40.0000 mg | INTRAMUSCULAR | Status: AC | PRN
Start: 2024-03-25 — End: 2024-03-25
  Administered 2024-03-25: 40 mg via INTRA_ARTICULAR

## 2024-03-25 MED ORDER — GABAPENTIN 300 MG PO CAPS: 300.0000 mg | ORAL_CAPSULE | Freq: Every evening | ORAL | 0 refills | Status: AC | PRN

## 2024-03-25 NOTE — Progress Notes (Signed)
 The patient is a 43 year old female we have seen in the past.  It has been a while since she has had surgery on her right shoulder which was repaired with full-thickness rotator cuff tear.  The right shoulder has been hurting her send the left shoulder been hurting quite a bit.  Has become a constant aching pain and is waking her up at night.  There is no known injury.  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  She has not been able to sleep at night.  She is taking ibuprofen  800 mg as well as Zanaflex .  On exam she does appear uncomfortable.  Both shoulders move smoothly and fluidly but are very painful with the left shoulder significantly worse than the right shoulder in terms of pain.  Her neck exam is normal.  She has good grip and pinch strength bilaterally.  X-rays of both shoulders show slightly high riding humeral heads and slight arthritic changes but no acute findings.  I did recommend a steroid injection in both shoulder subacromial outlets.  She is no longer diabetic and has all significant weight.  She did agree to the injections and tolerated them well.  Given the severity of her pain I will send in some tramadol  and also have her take gabapentin  300 mg just at bedtime.  We will then see her back in a month to see how she is doing overall.  We may end up recommending a MRI of the left shoulder which has been way more painful for her.  This will all depend on her clinical response to the injections and medications.    Procedure Note  Patient: Michelle Houston             Date of Birth: June 07, 1980           MRN: [id number]             Visit Date: 03/25/2024  Procedures: Visit Diagnoses:  1. Chronic right shoulder pain   2. Chronic left shoulder pain     Large Joint Inj: R subacromial bursa on 03/25/2024 9:35 AM Indications: pain and diagnostic evaluation Details: 22 G 1.5 in needle  Arthrogram:  No  Medications: 3 mL lidocaine  1 %; 40 mg methylPREDNISolone  acetate 40 MG/ML Outcome: tolerated well, no immediate complications Procedure, treatment alternatives, risks and benefits explained, specific risks discussed. Consent was given by the patient. Immediately prior to procedure a time out was called to verify the correct patient, procedure, equipment, support staff and site/side marked as required. Patient was prepped and draped in the usual sterile fashion.    Large Joint Inj: L subacromial bursa on 03/25/2024 9:35 AM Indications: pain and diagnostic evaluation Details: 22 G 1.5 in needle  Arthrogram: No  Medications: 3 mL lidocaine  1 %; 40 mg methylPREDNISolone  acetate 40 MG/ML Outcome: tolerated well, no immediate complications Procedure, treatment alternatives, risks and benefits explained, specific risks discussed. Consent was given by the patient. Immediately prior to procedure a time out was called to verify the correct patient, procedure, equipment, support staff and site/side marked as required. Patient was prepped and draped in the usual sterile fashion.

## 2024-04-13 ENCOUNTER — Telehealth: Payer: Self-pay | Admitting: Orthopaedic Surgery

## 2024-04-13 NOTE — Telephone Encounter (Signed)
 Pt called saying that her shoulders are hurting her real bad and that the cortisone injections didn't work. She has tried several OTC products and they don't work either. Call back number is [phone].

## 2024-04-14 ENCOUNTER — Other Ambulatory Visit: Payer: Self-pay

## 2024-04-14 ENCOUNTER — Other Ambulatory Visit: Payer: Self-pay | Admitting: Orthopaedic Surgery

## 2024-04-14 DIAGNOSIS — G8929 Other chronic pain: Secondary | ICD-10-CM

## 2024-04-14 NOTE — Telephone Encounter (Signed)
 LMOM for patient that we ware going to refer her to [facility] for her shoulders at this point

## 2024-04-22 ENCOUNTER — Ambulatory Visit: Admitting: Orthopaedic Surgery

## 2024-05-06 ENCOUNTER — Ambulatory Visit (INDEPENDENT_AMBULATORY_CARE_PROVIDER_SITE_OTHER): Admitting: Orthopaedic Surgery

## 2024-05-06 DIAGNOSIS — G8929 Other chronic pain: Secondary | ICD-10-CM | POA: Diagnosis not present

## 2024-05-06 DIAGNOSIS — M25512 Pain in left shoulder: Secondary | ICD-10-CM | POA: Diagnosis not present

## 2024-05-06 DIAGNOSIS — M25511 Pain in right shoulder: Secondary | ICD-10-CM | POA: Diagnosis not present

## 2024-05-06 NOTE — Progress Notes (Signed)
 Chief Complaint: Bilateral shoulder pain     History of Present Illness:    Michelle Houston is a 43 y.o. female with evidence of ongoing bilateral shoulder pain.  She is status post right shoulder rotator cuff repair in 2021 with Dr. Vernetta.  She does work a physical job in leisure centre manager.  She has having pain in both of her shoulders with overhead range of motion.  She has had multiple injections now in bilateral shoulders which have given her temporary relief.  She is still have experiencing pain with overhead range of motion.  Is having a very difficult time doing her job as result.  She does have a 50-year-old son.    PMH/PSH/Family History/Social History/Meds/Allergies:    Past Medical History:  Diagnosis Date   Bronchitis    Diabetes mellitus without complication (HCC)    being watched for it   Hypertension    Obesity    PCOS (polycystic ovarian syndrome)    Pneumonia    Past Surgical History:  Procedure Laterality Date   BLADDER REPAIR     CARPAL TUNNEL RELEASE     CHOLECYSTECTOMY     Social History   Socioeconomic History   Marital status: Legally Separated    Spouse name: Not on file   Number of children: Not on file   Years of education: Not on file   Highest education level: Not on file  Occupational History   Not on file  Tobacco Use   Smoking status: Every Day    Current packs/day: 0.25    Average packs/day: 0.3 packs/day for 18.0 years (4.5 ttl pk-yrs)    Types: Cigarettes   Smokeless tobacco: Never   Tobacco comments:    10 a day  Vaping Use   Vaping status: Never Used  Substance and Sexual Activity   Alcohol use: No   Drug use: No   Sexual activity: Yes    Partners: Male  Other Topics Concern   Not on file  Social History Narrative   Not on file   Social Drivers of Health   Tobacco Use: High Risk (12/13/2023)   Patient History    Smoking Tobacco Use: Every Day    Smokeless Tobacco Use: Never    Passive Exposure: Not on file   Financial Resource Strain: Not on file  Food Insecurity: Not on file  Transportation Needs: Not on file  Physical Activity: Not on file  Stress: Not on file  Social Connections: Unknown (11/05/2022)   Received from [facility]   Social Network    Social Network: Not on file  Depression (PHQ2-9): Low Risk (12/13/2023)   Depression (PHQ2-9)    PHQ-2 Score: 1  Alcohol Screen: Not on file  Housing: Not on file  Utilities: Not on file  Health Literacy: Not on file   Family History  Problem Relation Age of Onset   Hypertension Mother    Fibroids Mother    Cancer Father        lung   Diabetes Paternal Aunt    Cancer Maternal Grandmother    Stroke Maternal Grandfather    Diabetes Paternal Grandmother    Stroke Paternal Grandmother    Heart disease Paternal Grandfather    Breast cancer Neg Hx    Allergies[1] Current Outpatient Medications  Medication Sig Dispense Refill   Accu-Chek Softclix Lancets lancets Use as instructed 100 each 12   aspirin  EC 81 MG tablet Take 1 tablet (81 mg total) by mouth  daily. Take after 12 weeks for prevention of preeclampsia later in pregnancy 300 tablet 2   Blood Glucose Monitoring Suppl (ACCU-CHEK GUIDE) w/Device KIT 1 Device by Does not apply route 4 (four) times daily. 1 kit 0   fluconazole  (DIFLUCAN ) 150 MG tablet Take 1 tablet (150 mg total) by mouth once as needed for up to 2 doses (take one pill on day 1, and the second pill 3 days later). 2 tablet 0   gabapentin  (NEURONTIN ) 300 MG capsule Take 1 capsule (300 mg total) by mouth at bedtime as needed. 30 capsule 0   ipratropium-albuterol  (DUONEB) 0.5-2.5 (3) MG/3ML SOLN Take 3 mLs by nebulization every 4 (four) hours as needed. 360 mL 0   labetalol  (NORMODYNE ) 100 MG tablet Take 1 tablet (100 mg total) by mouth 2 (two) times daily. 60 tablet 3   MOUNJARO 10 MG/0.5ML Pen Inject 10 mg into the skin once a week.     pantoprazole  (PROTONIX ) 40 MG tablet Take 1 tablet (40 mg total) by mouth every  morning. 30 tablet 2   traMADol  (ULTRAM ) 50 MG tablet TAKE 1-2 TABLETS BY MOUTH EVERY 6 HOURS AS NEEDED. 30 tablet 0   No current facility-administered medications for this visit.   No results found.  Review of Systems:   A ROS was performed including pertinent positives and negatives as documented in the HPI.  Physical Exam :   Constitutional: NAD and appears stated age Neurological: Alert and oriented Psych: Appropriate affect and cooperative There were no vitals taken for this visit.   Comprehensive Musculoskeletal Exam:    Bilateral shoulders with tenderness about the anterior lateral deltoid with active forward elevation to 120 degrees with positive Neer impingement bilaterally external rotation of the side is 50 degrees internal rotation is to L1 bilaterally.  Distal neurosensory exam is intact 4 out of 5 strength forward elevation bilaterally   Imaging:   Xray (3 views right shoulder, 3 views left shoulder): Decreased acromiohumeral angle bilaterally consistent with rotator cuff arthropathy early     I personally reviewed and interpreted the radiographs.   Assessment and Plan:   43 y.o. female with evidence of bilateral right shoulder pain in the setting of likely bilateral rotator cuff tearing.  She is status post previous rotator cuff repair with Dr. Vernetta in 2021.  At this time she has trialed multiple injections in bilateral shoulders and given the history of a previous rotator cuff repair on the right side I did describe that I would like to obtain MRIs of both shoulders that I can assess for likely what I believed to be underlying bilateral rotator cuff tears and I will see her back following discuss results  -Plan for MRI bilateral shoulder and follow-up discussed results   I personally saw and evaluated the patient, and participated in the management and treatment plan.  Elspeth Parker, MD Attending Physician, Orthopedic Surgery  This document was dictated  using Dragon voice recognition software. A reasonable attempt at proof reading has been made to minimize errors.    [1] No Known Allergies

## 2024-05-24 ENCOUNTER — Ambulatory Visit
Admission: RE | Admit: 2024-05-24 | Discharge: 2024-05-24 | Disposition: A | Source: Ambulatory Visit | Attending: Orthopaedic Surgery

## 2024-05-24 DIAGNOSIS — G8929 Other chronic pain: Secondary | ICD-10-CM

## 2024-06-04 ENCOUNTER — Ambulatory Visit (HOSPITAL_BASED_OUTPATIENT_CLINIC_OR_DEPARTMENT_OTHER): Payer: Self-pay | Admitting: Orthopaedic Surgery

## 2024-06-05 ENCOUNTER — Other Ambulatory Visit: Payer: Self-pay | Admitting: Orthopaedic Surgery

## 2024-06-10 ENCOUNTER — Other Ambulatory Visit (HOSPITAL_BASED_OUTPATIENT_CLINIC_OR_DEPARTMENT_OTHER): Payer: Self-pay

## 2024-06-10 ENCOUNTER — Ambulatory Visit (INDEPENDENT_AMBULATORY_CARE_PROVIDER_SITE_OTHER): Admitting: Orthopaedic Surgery

## 2024-06-10 ENCOUNTER — Ambulatory Visit (HOSPITAL_BASED_OUTPATIENT_CLINIC_OR_DEPARTMENT_OTHER): Payer: Self-pay | Admitting: Orthopaedic Surgery

## 2024-06-10 DIAGNOSIS — M75122 Complete rotator cuff tear or rupture of left shoulder, not specified as traumatic: Secondary | ICD-10-CM

## 2024-06-10 MED ORDER — ASPIRIN 325 MG PO TBEC
325.0000 mg | DELAYED_RELEASE_TABLET | Freq: Every day | ORAL | 0 refills | Status: AC
  Filled 2024-06-10: qty 14, 14d supply, fill #0

## 2024-06-10 MED ORDER — ACETAMINOPHEN 500 MG PO TABS
500.0000 mg | ORAL_TABLET | Freq: Three times a day (TID) | ORAL | 0 refills | Status: AC
  Filled 2024-06-10: qty 30, 10d supply, fill #0

## 2024-06-10 MED ORDER — IBUPROFEN 800 MG PO TABS
800.0000 mg | ORAL_TABLET | Freq: Three times a day (TID) | ORAL | 0 refills | Status: AC
  Filled 2024-06-10: qty 30, 10d supply, fill #0

## 2024-06-10 MED ORDER — OXYCODONE HCL 5 MG PO TABS
5.0000 mg | ORAL_TABLET | ORAL | 0 refills | Status: AC | PRN
  Filled 2024-06-10: qty 20, 4d supply, fill #0

## 2024-06-10 NOTE — Progress Notes (Signed)
 "   Chief Complaint: Bilateral shoulder pain     History of Present Illness:   06/10/2024: Presents today for bilateral MRI discussion.  Michelle Houston is a 44 y.o. female with evidence of ongoing bilateral shoulder pain.  She is status post right shoulder rotator cuff repair in 2021 with Dr. Vernetta.  She does work a physical job in leisure centre manager.  She has having pain in both of her shoulders with overhead range of motion.  She has had multiple injections now in bilateral shoulders which have given her temporary relief.  She is still have experiencing pain with overhead range of motion.  Is having a very difficult time doing her job as result.  She does have a 90-year-old son.    PMH/PSH/Family History/Social History/Meds/Allergies:    Past Medical History:  Diagnosis Date   Bronchitis    Diabetes mellitus without complication (HCC)    being watched for it   Hypertension    Obesity    PCOS (polycystic ovarian syndrome)    Pneumonia    Past Surgical History:  Procedure Laterality Date   BLADDER REPAIR     CARPAL TUNNEL RELEASE     CHOLECYSTECTOMY     Social History   Socioeconomic History   Marital status: Legally Separated    Spouse name: Not on file   Number of children: Not on file   Years of education: Not on file   Highest education level: Not on file  Occupational History   Not on file  Tobacco Use   Smoking status: Every Day    Current packs/day: 0.25    Average packs/day: 0.3 packs/day for 18.0 years (4.5 ttl pk-yrs)    Types: Cigarettes   Smokeless tobacco: Never   Tobacco comments:    10 a day  Vaping Use   Vaping status: Never Used  Substance and Sexual Activity   Alcohol use: No   Drug use: No   Sexual activity: Yes    Partners: Male  Other Topics Concern   Not on file  Social History Narrative   Not on file   Social Drivers of Health   Tobacco Use: High Risk (12/13/2023)   Patient History    Smoking Tobacco Use: Every Day     Smokeless Tobacco Use: Never    Passive Exposure: Not on file  Financial Resource Strain: Not on file  Food Insecurity: Not on file  Transportation Needs: Not on file  Physical Activity: Not on file  Stress: Not on file  Social Connections: Unknown (11/05/2022)   Received from [facility]   Social Network    Social Network: Not on file  Depression (PHQ2-9): Low Risk (12/13/2023)   Depression (PHQ2-9)    PHQ-2 Score: 1  Alcohol Screen: Not on file  Housing: Not on file  Utilities: Not on file  Health Literacy: Not on file   Family History  Problem Relation Age of Onset   Hypertension Mother    Fibroids Mother    Cancer Father        lung   Diabetes Paternal Aunt    Cancer Maternal Grandmother    Stroke Maternal Grandfather    Diabetes Paternal Grandmother    Stroke Paternal Grandmother    Heart disease Paternal Grandfather    Breast cancer Neg Hx    Allergies[1] Current Outpatient Medications  Medication Sig Dispense Refill   acetaminophen  (TYLENOL ) 500 MG tablet Take 1 tablet (500 mg total) by mouth every 8 (eight) hours  for 10 days. 30 tablet 0   aspirin  EC 325 MG tablet Take 1 tablet (325 mg total) by mouth daily. 14 tablet 0   ibuprofen  (ADVIL ) 800 MG tablet Take 1 tablet (800 mg total) by mouth every 8 (eight) hours for 10 days. Please take with food, please alternate with acetaminophen  30 tablet 0   oxyCODONE  (ROXICODONE ) 5 MG immediate release tablet Take 1 tablet (5 mg total) by mouth every 4 (four) hours as needed for severe pain (pain score 7-10) or breakthrough pain. 20 tablet 0   Accu-Chek Softclix Lancets lancets Use as instructed 100 each 12   aspirin  EC 81 MG tablet Take 1 tablet (81 mg total) by mouth daily. Take after 12 weeks for prevention of preeclampsia later in pregnancy 300 tablet 2   Blood Glucose Monitoring Suppl (ACCU-CHEK GUIDE) w/Device KIT 1 Device by Does not apply route 4 (four) times daily. 1 kit 0   fluconazole  (DIFLUCAN ) 150 MG tablet Take 1  tablet (150 mg total) by mouth once as needed for up to 2 doses (take one pill on day 1, and the second pill 3 days later). 2 tablet 0   gabapentin  (NEURONTIN ) 300 MG capsule Take 1 capsule (300 mg total) by mouth at bedtime as needed. 30 capsule 0   ipratropium-albuterol  (DUONEB) 0.5-2.5 (3) MG/3ML SOLN Take 3 mLs by nebulization every 4 (four) hours as needed. 360 mL 0   labetalol  (NORMODYNE ) 100 MG tablet Take 1 tablet (100 mg total) by mouth 2 (two) times daily. 60 tablet 3   MOUNJARO 10 MG/0.5ML Pen Inject 10 mg into the skin once a week.     pantoprazole  (PROTONIX ) 40 MG tablet Take 1 tablet (40 mg total) by mouth every morning. 30 tablet 2   traMADol  (ULTRAM ) 50 MG tablet TAKE 1 TO 2 TABLETS BY MOUTH EVERY 6 HOURS AS NEEDED 30 tablet 0   No current facility-administered medications for this visit.   No results found.  Review of Systems:   A ROS was performed including pertinent positives and negatives as documented in the HPI.  Physical Exam :   Constitutional: NAD and appears stated age Neurological: Alert and oriented Psych: Appropriate affect and cooperative There were no vitals taken for this visit.   Comprehensive Musculoskeletal Exam:    Bilateral shoulders with tenderness about the anterior lateral deltoid with active forward elevation to 120 degrees with positive Neer impingement bilaterally external rotation of the side is 50 degrees internal rotation is to L1 bilaterally.  Distal neurosensory exam is intact 4 out of 5 strength forward elevation bilaterally   Imaging:   Xray (3 views right shoulder, 3 views left shoulder): Decreased acromiohumeral angle bilaterally consistent with rotator cuff arthropathy early  MRI right shoulder, MRI left shoulder: Full-thickness right shoulder supraspinatus recurrent tear with significant fatty atrophy chronically on T1, left shoulder supraspinatus tear without atrophy on T1 view   I personally reviewed and interpreted the  radiographs.   Assessment and Plan:   44 y.o. female with evidence of bilateral right shoulder pain in the setting of likely bilateral rotator cuff tearing.  She is status post previous rotator cuff repair with Dr. Vernetta in 2021.  I did discuss treatment options.  Specifically I do believe she would benefit from a left shoulder arthroscopy with rotator cuff repair collagen patch augmentation given her history of failure on the right side.  I did discuss that with regard to the right side I do ultimately believe she would benefit  from superior capsular reconstruction.  I discussed the risks and limitations of both surgeries.  After discussion she would like to proceed with a left  - For left shoulder arthroscopy with rotator cuff repair and collagen patch augmentation   After a lengthy discussion of treatment options, including risks, benefits, alternatives, complications of surgical and nonsurgical conservative options, the patient elected surgical repair.   The patient  is aware of the material risks  and complications including, but not limited to injury to adjacent structures, neurovascular injury, infection, numbness, bleeding, implant failure, thermal burns, stiffness, persistent pain, failure to heal, disease transmission from allograft, need for further surgery, dislocation, anesthetic risks, blood clots, risks of death,and others. The probabilities of surgical success and failure discussed with patient given their particular co-morbidities.The time and nature of expected rehabilitation and recovery was discussed.The patient's questions were all answered preoperatively.  No barriers to understanding were noted. I explained the natural history of the disease process and Rx rationale.  I explained to the patient what I considered to be reasonable expectations given their personal situation.  The final treatment plan was arrived at through a shared patient decision making process model.    I  personally saw and evaluated the patient, and participated in the management and treatment plan.  Elspeth Parker, MD Attending Physician, Orthopedic Surgery  This document was dictated using Dragon voice recognition software. A reasonable attempt at proof reading has been made to minimize errors.     [1] No Known Allergies  "

## 2024-06-24 ENCOUNTER — Ambulatory Visit (INDEPENDENT_AMBULATORY_CARE_PROVIDER_SITE_OTHER): Admitting: Orthopaedic Surgery

## 2024-06-24 DIAGNOSIS — M75122 Complete rotator cuff tear or rupture of left shoulder, not specified as traumatic: Secondary | ICD-10-CM

## 2024-06-24 NOTE — Progress Notes (Signed)
 "   Chief Complaint: Bilateral shoulder pain     History of Present Illness:   06/24/2024: Presents today for additional follow-up and discussion of her bilateral shoulders.  Michelle Houston is a 44 y.o. female with evidence of ongoing bilateral shoulder pain.  She is status post right shoulder rotator cuff repair in 2021 with Dr. Vernetta.  She does work a physical job in leisure centre manager.  She has having pain in both of her shoulders with overhead range of motion.  She has had multiple injections now in bilateral shoulders which have given her temporary relief.  She is still have experiencing pain with overhead range of motion.  Is having a very difficult time doing her job as result.  She does have a 70-year-old son.    PMH/PSH/Family History/Social History/Meds/Allergies:    Past Medical History:  Diagnosis Date   Bronchitis    Diabetes mellitus without complication (HCC)    being watched for it   Hypertension    Obesity    PCOS (polycystic ovarian syndrome)    Pneumonia    Past Surgical History:  Procedure Laterality Date   BLADDER REPAIR     CARPAL TUNNEL RELEASE     CHOLECYSTECTOMY     Social History   Socioeconomic History   Marital status: Legally Separated    Spouse name: Not on file   Number of children: Not on file   Years of education: Not on file   Highest education level: Not on file  Occupational History   Not on file  Tobacco Use   Smoking status: Every Day    Current packs/day: 0.25    Average packs/day: 0.3 packs/day for 18.0 years (4.5 ttl pk-yrs)    Types: Cigarettes   Smokeless tobacco: Never   Tobacco comments:    10 a day  Vaping Use   Vaping status: Never Used  Substance and Sexual Activity   Alcohol use: No   Drug use: No   Sexual activity: Yes    Partners: Male  Other Topics Concern   Not on file  Social History Narrative   Not on file   Social Drivers of Health   Tobacco Use: High Risk (12/13/2023)   Patient History     Smoking Tobacco Use: Every Day    Smokeless Tobacco Use: Never    Passive Exposure: Not on file  Financial Resource Strain: Not on file  Food Insecurity: Not on file  Transportation Needs: Not on file  Physical Activity: Not on file  Stress: Not on file  Social Connections: Unknown (11/05/2022)   Received from [facility]   Social Network    Social Network: Not on file  Depression (PHQ2-9): Low Risk (12/13/2023)   Depression (PHQ2-9)    PHQ-2 Score: 1  Alcohol Screen: Not on file  Housing: Not on file  Utilities: Not on file  Health Literacy: Not on file   Family History  Problem Relation Age of Onset   Hypertension Mother    Fibroids Mother    Cancer Father        lung   Diabetes Paternal Aunt    Cancer Maternal Grandmother    Stroke Maternal Grandfather    Diabetes Paternal Grandmother    Stroke Paternal Grandmother    Heart disease Paternal Grandfather    Breast cancer Neg Hx    Allergies[1] Current Outpatient Medications  Medication Sig Dispense Refill   Accu-Chek Softclix Lancets lancets Use as instructed 100 each 12  aspirin  EC 325 MG tablet Take 1 tablet (325 mg total) by mouth daily. 14 tablet 0   aspirin  EC 81 MG tablet Take 1 tablet (81 mg total) by mouth daily. Take after 12 weeks for prevention of preeclampsia later in pregnancy 300 tablet 2   Blood Glucose Monitoring Suppl (ACCU-CHEK GUIDE) w/Device KIT 1 Device by Does not apply route 4 (four) times daily. 1 kit 0   fluconazole  (DIFLUCAN ) 150 MG tablet Take 1 tablet (150 mg total) by mouth once as needed for up to 2 doses (take one pill on day 1, and the second pill 3 days later). 2 tablet 0   gabapentin  (NEURONTIN ) 300 MG capsule Take 1 capsule (300 mg total) by mouth at bedtime as needed. 30 capsule 0   ipratropium-albuterol  (DUONEB) 0.5-2.5 (3) MG/3ML SOLN Take 3 mLs by nebulization every 4 (four) hours as needed. 360 mL 0   labetalol  (NORMODYNE ) 100 MG tablet Take 1 tablet (100 mg total) by mouth 2 (two)  times daily. 60 tablet 3   MOUNJARO 10 MG/0.5ML Pen Inject 10 mg into the skin once a week.     oxyCODONE  (ROXICODONE ) 5 MG immediate release tablet Take 1 tablet (5 mg total) by mouth every 4 (four) hours as needed for severe pain (pain score 7-10) or breakthrough pain. 20 tablet 0   pantoprazole  (PROTONIX ) 40 MG tablet Take 1 tablet (40 mg total) by mouth every morning. 30 tablet 2   traMADol  (ULTRAM ) 50 MG tablet TAKE 1 TO 2 TABLETS BY MOUTH EVERY 6 HOURS AS NEEDED 30 tablet 0   No current facility-administered medications for this visit.   No results found.  Review of Systems:   A ROS was performed including pertinent positives and negatives as documented in the HPI.  Physical Exam :   Constitutional: NAD and appears stated age Neurological: Alert and oriented Psych: Appropriate affect and cooperative There were no vitals taken for this visit.   Comprehensive Musculoskeletal Exam:    Bilateral shoulders with tenderness about the anterior lateral deltoid with active forward elevation to 120 degrees with positive Neer impingement bilaterally external rotation of the side is 50 degrees internal rotation is to L1 bilaterally.  Distal neurosensory exam is intact 4 out of 5 strength forward elevation bilaterally   Imaging:   Xray (3 views right shoulder, 3 views left shoulder): Decreased acromiohumeral angle bilaterally consistent with rotator cuff arthropathy early  MRI right shoulder, MRI left shoulder: Full-thickness right shoulder supraspinatus recurrent tear with significant fatty atrophy chronically on T1, left shoulder supraspinatus tear without atrophy on T1 view   I personally reviewed and interpreted the radiographs.   Assessment and Plan:   44 y.o. female with evidence of bilateral right shoulder pain in the setting of likely bilateral rotator cuff tearing.  She is status post previous rotator cuff repair with Dr. Vernetta in 2021.  I did discuss treatment options.   Specifically I do believe she would benefit from a left shoulder arthroscopy with rotator cuff repair collagen patch augmentation given her history of failure on the right side.  I did discuss that with regard to the right side I do ultimately believe she would benefit from superior capsular reconstruction.  I discussed the risks and limitations of both surgeries.  After discussion she would like to proceed with a left  - For left shoulder arthroscopy with rotator cuff repair and collagen patch augmentation   After a lengthy discussion of treatment options, including risks, benefits, alternatives, complications  of surgical and nonsurgical conservative options, the patient elected surgical repair.   The patient  is aware of the material risks  and complications including, but not limited to injury to adjacent structures, neurovascular injury, infection, numbness, bleeding, implant failure, thermal burns, stiffness, persistent pain, failure to heal, disease transmission from allograft, need for further surgery, dislocation, anesthetic risks, blood clots, risks of death,and others. The probabilities of surgical success and failure discussed with patient given their particular co-morbidities.The time and nature of expected rehabilitation and recovery was discussed.The patient's questions were all answered preoperatively.  No barriers to understanding were noted. I explained the natural history of the disease process and Rx rationale.  I explained to the patient what I considered to be reasonable expectations given their personal situation.  The final treatment plan was arrived at through a shared patient decision making process model.    I personally saw and evaluated the patient, and participated in the management and treatment plan.  Elspeth Parker, MD Attending Physician, Orthopedic Surgery  This document was dictated using Dragon voice recognition software. A reasonable attempt at proof reading has  been made to minimize errors.     [1] No Known Allergies  "
